# Patient Record
Sex: Female | Born: 1955 | Race: Black or African American | Hispanic: No | Smoking: Current every day smoker
Health system: Southern US, Community
[De-identification: ages and names within clinical notes are randomized; demographics above are authoritative.]

## PROBLEM LIST (undated history)

## (undated) DIAGNOSIS — T7840XA Allergy, unspecified, initial encounter: Secondary | ICD-10-CM

## (undated) DIAGNOSIS — J449 Chronic obstructive pulmonary disease, unspecified: Secondary | ICD-10-CM

## (undated) DIAGNOSIS — R55 Syncope and collapse: Secondary | ICD-10-CM

## (undated) DIAGNOSIS — Z720 Tobacco use: Secondary | ICD-10-CM

## (undated) HISTORY — PX: CHOLECYSTECTOMY: SHX55

## (undated) HISTORY — DX: Allergy, unspecified, initial encounter: T78.40XA

## (undated) HISTORY — DX: Syncope and collapse: R55

## (undated) HISTORY — DX: Chronic obstructive pulmonary disease, unspecified: J44.9

## (undated) HISTORY — DX: Tobacco use: Z72.0

---

## 2001-03-07 ENCOUNTER — Encounter: Payer: Self-pay | Admitting: Internal Medicine

## 2001-03-07 ENCOUNTER — Ambulatory Visit (HOSPITAL_COMMUNITY): Admission: RE | Admit: 2001-03-07 | Discharge: 2001-03-07 | Payer: Self-pay | Admitting: Internal Medicine

## 2001-12-11 ENCOUNTER — Encounter: Payer: Self-pay | Admitting: Obstetrics and Gynecology

## 2001-12-11 ENCOUNTER — Inpatient Hospital Stay (HOSPITAL_COMMUNITY): Admission: AD | Admit: 2001-12-11 | Discharge: 2001-12-11 | Payer: Self-pay | Admitting: Family Medicine

## 2004-07-19 ENCOUNTER — Emergency Department (HOSPITAL_COMMUNITY): Admission: EM | Admit: 2004-07-19 | Discharge: 2004-07-20 | Payer: Self-pay | Admitting: *Deleted

## 2012-09-19 ENCOUNTER — Encounter (HOSPITAL_COMMUNITY): Payer: Self-pay | Admitting: *Deleted

## 2012-09-19 ENCOUNTER — Emergency Department (HOSPITAL_COMMUNITY)
Admission: EM | Admit: 2012-09-19 | Discharge: 2012-09-20 | Disposition: A | Discharge status: Home / Self Care | Payer: Self-pay | Attending: Emergency Medicine | Admitting: Emergency Medicine

## 2012-09-19 ENCOUNTER — Emergency Department (HOSPITAL_COMMUNITY): Payer: Self-pay

## 2012-09-19 DIAGNOSIS — R52 Pain, unspecified: Secondary | ICD-10-CM | POA: Insufficient documentation

## 2012-09-19 DIAGNOSIS — R112 Nausea with vomiting, unspecified: Secondary | ICD-10-CM | POA: Insufficient documentation

## 2012-09-19 DIAGNOSIS — J111 Influenza due to unidentified influenza virus with other respiratory manifestations: Secondary | ICD-10-CM

## 2012-09-19 DIAGNOSIS — R51 Headache: Secondary | ICD-10-CM | POA: Insufficient documentation

## 2012-09-19 DIAGNOSIS — R05 Cough: Secondary | ICD-10-CM | POA: Insufficient documentation

## 2012-09-19 DIAGNOSIS — I951 Orthostatic hypotension: Secondary | ICD-10-CM | POA: Insufficient documentation

## 2012-09-19 DIAGNOSIS — R059 Cough, unspecified: Secondary | ICD-10-CM | POA: Insufficient documentation

## 2012-09-19 DIAGNOSIS — F17200 Nicotine dependence, unspecified, uncomplicated: Secondary | ICD-10-CM | POA: Insufficient documentation

## 2012-09-19 LAB — URINALYSIS, ROUTINE W REFLEX MICROSCOPIC
Glucose, UA: NEGATIVE mg/dL
Leukocytes, UA: NEGATIVE
Protein, ur: NEGATIVE mg/dL
pH: 6 (ref 5.0–8.0)

## 2012-09-19 LAB — URINE MICROSCOPIC-ADD ON

## 2012-09-19 LAB — POCT I-STAT, CHEM 8
Calcium, Ion: 1.13 mmol/L (ref 1.12–1.23)
Chloride: 105 mEq/L (ref 96–112)
Glucose, Bld: 88 mg/dL (ref 70–99)
HCT: 42 % (ref 36.0–46.0)
Hemoglobin: 14.3 g/dL (ref 12.0–15.0)
Potassium: 3.8 mEq/L (ref 3.5–5.1)

## 2012-09-19 MED ORDER — MORPHINE SULFATE 4 MG/ML IJ SOLN
2.0000 mg | Freq: Once | INTRAMUSCULAR | Status: AC
  Administered 2012-09-19: 2 mg via INTRAVENOUS
  Filled 2012-09-19: qty 1

## 2012-09-19 MED ORDER — SODIUM CHLORIDE 0.9 % IV BOLUS (SEPSIS)
1000.0000 mL | Freq: Once | INTRAVENOUS | Status: AC
  Administered 2012-09-19: 1000 mL via INTRAVENOUS

## 2012-09-19 MED ORDER — ONDANSETRON HCL 4 MG/2ML IJ SOLN
4.0000 mg | Freq: Once | INTRAMUSCULAR | Status: AC
  Administered 2012-09-19: 4 mg via INTRAVENOUS
  Filled 2012-09-19: qty 2

## 2012-09-19 MED ORDER — KETOROLAC TROMETHAMINE 30 MG/ML IJ SOLN
30.0000 mg | Freq: Once | INTRAMUSCULAR | Status: AC
  Administered 2012-09-19: 30 mg via INTRAVENOUS
  Filled 2012-09-19: qty 1

## 2012-09-19 NOTE — ED Notes (Signed)
Fever, chills, nausea, vomiting, generalized body aches since Sunday.  Last time vomited was Monday.

## 2012-09-19 NOTE — ED Notes (Signed)
Patient ambulatory to restoom 

## 2012-09-20 MED ORDER — SODIUM CHLORIDE 0.9 % IV SOLN
Freq: Once | INTRAVENOUS | Status: AC
  Administered 2012-09-20: 1000 mL via INTRAVENOUS

## 2012-09-20 MED ORDER — ONDANSETRON HCL 4 MG PO TABS: 4.0000 mg | ORAL_TABLET | Freq: Four times a day (QID) | ORAL | Status: DC

## 2012-09-20 NOTE — ED Provider Notes (Addendum)
History     CSN: [id number]  Arrival date & time 09/19/12  2210   First MD Initiated Contact with Patient 09/19/12 2307      Chief Complaint  Patient presents with  . Influenza    (Consider location/radiation/quality/duration/timing/severity/associated sxs/prior treatment) HPI  Stephanie Hendrix is a 56 y.o. female who presents to the Emergency Department complaining of fever, chills, nausea, vomiting, headache, cough and generalized body aches since Sunday. Last vomiting was Monday. She has been using tylenol for fevers.   History reviewed. No pertinent past medical history.  Past Surgical History  Procedure Date  . Cholecystectomy     No family history on file.  History  Substance Use Topics  . Smoking status: Current Every Day Smoker -- 1.5 packs/day for 43 years  . Smokeless tobacco: Never Used  . Alcohol Use: No    OB History    Grav Para Term Preterm Abortions TAB SAB Ect Mult Living   7 6 6  1  1   6      Review of Systems  Constitutional: Positive for fever, chills and diaphoresis.       10  Systems reviewed and are negative for acute change except as noted in the HPI.  HENT: Negative for congestion.   Eyes: Negative for discharge and redness.  Respiratory: Positive for cough. Negative for shortness of breath.   Cardiovascular: Negative for chest pain.  Gastrointestinal: Positive for nausea and vomiting. Negative for abdominal pain.  Musculoskeletal: Negative for back pain.  Skin: Negative for rash.  Neurological: Positive for headaches. Negative for syncope and numbness.  Psychiatric/Behavioral:       No behavior change.    Allergies  Penicillins  Home Medications   Current Outpatient Rx  Name  Route  Sig  Dispense  Refill  . GUAIFENESIN 100 MG/5ML PO SYRP   Oral   Take 200 mg by mouth daily as needed. For cough           BP 83/71  Pulse 68  Temp 99.3 F (37.4 C) (Oral)  Resp 18  Ht 5\' 6"  (1.676 m)  Wt 140 lb (63.504 kg)  BMI 22.60  kg/m2  SpO2 93%  Physical Exam  Nursing note and vitals reviewed. Constitutional: She appears well-developed and well-nourished.       Awake, alert, nontoxic appearance.  HENT:  Head: Atraumatic.  Right Ear: External ear normal.  Left Ear: External ear normal.  Mouth/Throat: Oropharynx is clear and moist.  Eyes: Right eye exhibits no discharge. Left eye exhibits no discharge.  Neck: Neck supple.  Cardiovascular: Normal heart sounds.   Pulmonary/Chest: Effort normal and breath sounds normal. She exhibits no tenderness.  Abdominal: Soft. There is no tenderness. There is no rebound.  Musculoskeletal: She exhibits no tenderness.       Baseline ROM, no obvious new focal weakness.  Neurological:       Mental status and motor strength appears baseline for patient and situation.  Skin: No rash noted.  Psychiatric: She has a normal mood and affect.    ED Course  Procedures (including critical care time) Results for orders placed during the hospital encounter of 09/19/12  URINALYSIS, ROUTINE W REFLEX MICROSCOPIC      Component Value Range   Color, Urine YELLOW  YELLOW   APPearance CLEAR  CLEAR   Specific Gravity, Urine >1.030 (*) 1.005 - 1.030   pH 6.0  5.0 - 8.0   Glucose, UA NEGATIVE  NEGATIVE mg/dL   Hgb  urine dipstick MODERATE (*) NEGATIVE   Bilirubin Urine SMALL (*) NEGATIVE   Ketones, ur TRACE (*) NEGATIVE mg/dL   Protein, ur NEGATIVE  NEGATIVE mg/dL   Urobilinogen, UA 2.0 (*) 0.0 - 1.0 mg/dL   Nitrite NEGATIVE  NEGATIVE   Leukocytes, UA NEGATIVE  NEGATIVE  URINE MICROSCOPIC-ADD ON      Component Value Range   Squamous Epithelial / LPF MANY (*) RARE   WBC, UA 0-2  <3 WBC/hpf   RBC / HPF 3-6  <3 RBC/hpf   Bacteria, UA MANY (*) RARE   Urine-Other MUCOUS PRESENT    POCT I-STAT, CHEM 8      Component Value Range   Sodium 137  135 - 145 mEq/L   Potassium 3.8  3.5 - 5.1 mEq/L   Chloride 105  96 - 112 mEq/L   BUN 10  6 - 23 mg/dL   Creatinine, Ser 7.84  0.50 - 1.10 mg/dL     Glucose, Bld 88  70 - 99 mg/dL   Calcium, Ion 6.96  2.95 - 1.23 mmol/L   TCO2 21  0 - 100 mmol/L   Hemoglobin 14.3  12.0 - 15.0 g/dL   HCT 28.4  13.2 - 44.0 %    Dg Chest 2 View  09/20/2012  *RADIOLOGY REPORT*  Clinical Data: Body aches and chills; fever, cough, congestion and diaphoresis.  History of smoking.  CHEST - 2 VIEW  Comparison: Chest radiograph performed 07/20/2004  Findings: The lungs are well-aerated.  Mild chronic peribronchial thickening is noted.  There is no evidence of focal opacification, pleural effusion or pneumothorax.  A left-sided nipple shadow is seen.  The heart is normal in size; the mediastinal contour is within normal limits.  No acute osseous abnormalities are seen.  IMPRESSION: Mild chronic peribronchial thickening noted; no acute cardiopulmonary process seen.   Original Report Authenticated By: Tonia Ghent, M.D.      [phone] Ready to discharge patient and noted BP low. Asked that orthostatics be obtained. Patient is orthostatic. Standing BP drops to the 60's. No associated dizziness. Ordered additional liter of IVF.  2536 Patient has had 2 liters of IVF, had more PO fluids. BP back at baseline. Patient continues to feel much better.    MDM  Patient with flu symptoms since Sunday. Given IVF, analgesic, antiinflammatory and antiemetic with improvement. She has taken PO fluids. Feels better.  Pt feels improved after observation and/or treatment in ED.Pt stable in ED with no significant deterioration in condition.The patient appears reasonably screened and/or stabilized for discharge and I doubt any other medical condition or other [facility] requiring further screening, evaluation, or treatment in the ED at this time prior to discharge.  MDM Reviewed: nursing note and vitals Interpretation: labs          Nicoletta Dress. Colon Branch, MD 09/20/12 0127  Nicoletta Dress. Colon Branch, MD 09/20/12 [phone]

## 2012-09-20 NOTE — ED Notes (Signed)
Rechecked Vital signs prior to removing IV.  BP low - requested orthostatic vitals.  Reported results to MD.  Orders received.

## 2012-09-20 NOTE — ED Notes (Signed)
Tolerated ice chips.  Dr Colon Branch back in to talk with patient.

## 2012-09-20 NOTE — ED Notes (Signed)
Home with daughter driving- states she feels better. Denies any dizziness or nausea or pain

## 2012-09-20 NOTE — ED Notes (Signed)
Patient and daughter advised of need for additional fluids prior to discharge.  Daughter sitting at bedside

## 2012-09-20 NOTE — ED Notes (Signed)
Requesting some ice chips.  States her pain is all gone.  Still feels a little "queasy"   Is not vomiting.  States she feels hot now and has removed her covers.

## 2012-09-20 NOTE — ED Notes (Signed)
No complaints from patient during orthostatic vital signs

## 2013-03-28 ENCOUNTER — Emergency Department (HOSPITAL_COMMUNITY)
Admission: EM | Admit: 2013-03-28 | Discharge: 2013-03-28 | Disposition: A | Discharge status: Home / Self Care | Payer: Self-pay | Attending: Emergency Medicine | Admitting: Emergency Medicine

## 2013-03-28 ENCOUNTER — Encounter (HOSPITAL_COMMUNITY): Payer: Self-pay

## 2013-03-28 DIAGNOSIS — Z880 Allergy status to penicillin: Secondary | ICD-10-CM | POA: Insufficient documentation

## 2013-03-28 DIAGNOSIS — L299 Pruritus, unspecified: Secondary | ICD-10-CM | POA: Insufficient documentation

## 2013-03-28 DIAGNOSIS — L509 Urticaria, unspecified: Secondary | ICD-10-CM | POA: Insufficient documentation

## 2013-03-28 DIAGNOSIS — R21 Rash and other nonspecific skin eruption: Secondary | ICD-10-CM

## 2013-03-28 DIAGNOSIS — F17200 Nicotine dependence, unspecified, uncomplicated: Secondary | ICD-10-CM | POA: Insufficient documentation

## 2013-03-28 MED ORDER — FAMOTIDINE 20 MG PO TABS
20.0000 mg | ORAL_TABLET | Freq: Once | ORAL | Status: AC
  Administered 2013-03-28: 20 mg via ORAL
  Filled 2013-03-28: qty 1

## 2013-03-28 MED ORDER — PREDNISONE 10 MG PO TABS: ORAL_TABLET | ORAL | Status: DC

## 2013-03-28 MED ORDER — HYDROXYZINE HCL 25 MG PO TABS: 25.0000 mg | ORAL_TABLET | Freq: Four times a day (QID) | ORAL | Status: DC

## 2013-03-28 MED ORDER — METHYLPREDNISOLONE SODIUM SUCC 125 MG IJ SOLR
125.0000 mg | Freq: Once | INTRAMUSCULAR | Status: AC
  Administered 2013-03-28: 125 mg via INTRAMUSCULAR
  Filled 2013-03-28: qty 2

## 2013-03-28 MED ORDER — HYDROXYZINE HCL 25 MG PO TABS
50.0000 mg | ORAL_TABLET | Freq: Once | ORAL | Status: AC
  Administered 2013-03-28: 50 mg via ORAL
  Filled 2013-03-28: qty 2

## 2013-03-28 NOTE — ED Notes (Signed)
Rash to back of neck,arms and behind ears. First noted this am, has taken benadryl w. No relief.  No resp distress.

## 2013-03-28 NOTE — ED Provider Notes (Signed)
Medical screening examination/treatment/procedure(s) were performed by non-physician practitioner and as supervising physician I was immediately available for consultation/collaboration.  Kenita Bines R. Avacyn Kloosterman, MD 03/28/13 2023 

## 2013-03-28 NOTE — ED Provider Notes (Signed)
History    CSN: [id number] Arrival date & time 03/28/13  1635  None    Chief Complaint  Patient presents with  . Rash   (Consider location/radiation/quality/duration/timing/severity/associated sxs/prior Treatment) HPI Comments: Pt noted rash and itching behind the ears, neck and both arms. No involvement of the abd, or lower extremities. No recent changes in soap, body splash, cologne. Pt denies use of jewry. No SOB.  Patient is a 57 y.o. female presenting with rash. The history is provided by the patient.  Rash Pain location: behind the ears, neck and arms. Pain quality comment:  No pain Pain radiates to:  Does not radiate Pain severity:  No pain Onset quality:  Gradual Duration:  1 day Timing:  Constant Progression:  Worsening Chronicity:  New Context: not alcohol use, not diet changes, not eating, not recent illness and not recent travel   Relieved by:  Nothing Worsened by:  Nothing tried Ineffective treatments:  OTC medications Associated symptoms: no anorexia, no chest pain, no cough, no dysuria, no fever, no hematuria and no shortness of breath   Risk factors: no alcohol abuse and no recent hospitalization    History reviewed. No pertinent past medical history. Past Surgical History  Procedure Laterality Date  . Cholecystectomy     No family history on file. History  Substance Use Topics  . Smoking status: Current Every Day Smoker -- 1.50 packs/day for 43 years    Types: Cigarettes  . Smokeless tobacco: Never Used  . Alcohol Use: No   OB History   Grav Para Term Preterm Abortions TAB SAB Ect Mult Living   7 6 6  1  1   6      Review of Systems  Constitutional: Negative for fever and activity change.       All ROS Neg except as noted in HPI  HENT: Negative for nosebleeds and neck pain.   Eyes: Negative for photophobia and discharge.  Respiratory: Negative for cough, shortness of breath and wheezing.   Cardiovascular: Negative for chest pain and  palpitations.  Gastrointestinal: Negative for abdominal pain, blood in stool and anorexia.  Genitourinary: Negative for dysuria, frequency and hematuria.  Musculoskeletal: Negative for back pain and arthralgias.  Skin: Positive for rash.  Neurological: Negative for dizziness, seizures and speech difficulty.  Psychiatric/Behavioral: Negative for hallucinations and confusion.    Allergies  Penicillins  Home Medications   Current Outpatient Rx  Name  Route  Sig  Dispense  Refill  . guaifenesin (TUSSIN) 100 MG/5ML syrup   Oral   Take 200 mg by mouth daily as needed. For cough         . ondansetron (ZOFRAN) 4 MG tablet   Oral   Take 1 tablet (4 mg total) by mouth every 6 (six) hours.   12 tablet   0    BP 112/56  Pulse 65  Temp(Src) 97.1 F (36.2 C) (Oral)  Resp 18  Ht 5' 5.5" (1.664 m)  Wt 142 lb (64.411 kg)  BMI 23.26 kg/m2  SpO2 100% Physical Exam  Nursing note and vitals reviewed. Constitutional: She is oriented to person, place, and time. She appears well-developed and well-nourished.  Non-toxic appearance.  HENT:  Head: Normocephalic.  Right Ear: Tympanic membrane and external ear normal.  Left Ear: Tympanic membrane and external ear normal.  Eyes: EOM and lids are normal. Pupils are equal, round, and reactive to light.  Neck: Normal range of motion. Neck supple. Carotid bruit is not present.  Cardiovascular:  Normal rate, regular rhythm, normal heart sounds, intact distal pulses and normal pulses.   Pulmonary/Chest: Breath sounds normal. No respiratory distress.  Abdominal: Soft. Bowel sounds are normal. There is no tenderness. There is no guarding.  Musculoskeletal: Normal range of motion.  Lymphadenopathy:       Head (right side): No submandibular adenopathy present.       Head (left side): No submandibular adenopathy present.    She has no cervical adenopathy.  Neurological: She is alert and oriented to person, place, and time. She has normal strength. No  cranial nerve deficit or sensory deficit.  Skin: Skin is warm and dry.  Hives type rash behind ears, back of the neck, and on the inner aspect of the forearms.  Patient has insect bites on the arms that have been scratched and some with denuded areas present.  Psychiatric: She has a normal mood and affect. Her speech is normal.    ED Course  Procedures (including critical care time) Labs Reviewed - No data to display No results found. No diagnosis found.  MDM  I have reviewed nursing notes, vital signs, and all appropriate lab and imaging results for this patient. Patient presents to the emergency department with a hives type rash behind the ears, neck, and both arms. No definite trigger identified. Airway is patent, speech is clear and understandable. Patient speaks in complete sentences.  Patient will be treated with prednisone taper, and Vistaril. Patient to return if any changes or complications.  Kathie Dike, PA-C 03/28/13 [phone]

## 2013-03-28 NOTE — ED Notes (Signed)
Itching rash behind ears and on neck, onset this am. Denies any new soaps, lotions or shampool

## 2013-08-12 ENCOUNTER — Other Ambulatory Visit (HOSPITAL_COMMUNITY): Payer: Self-pay | Admitting: Physician Assistant

## 2013-08-12 DIAGNOSIS — Z139 Encounter for screening, unspecified: Secondary | ICD-10-CM

## 2013-08-26 ENCOUNTER — Ambulatory Visit (HOSPITAL_COMMUNITY): Payer: Self-pay

## 2014-07-27 ENCOUNTER — Encounter (HOSPITAL_COMMUNITY): Payer: Self-pay

## 2016-05-22 ENCOUNTER — Emergency Department (HOSPITAL_COMMUNITY)
Admission: EM | Admit: 2016-05-22 | Discharge: 2016-05-22 | Disposition: A | Discharge status: Home / Self Care | Payer: Self-pay | Attending: Emergency Medicine | Admitting: Emergency Medicine

## 2016-05-22 ENCOUNTER — Encounter (HOSPITAL_COMMUNITY): Payer: Self-pay | Admitting: *Deleted

## 2016-05-22 DIAGNOSIS — R21 Rash and other nonspecific skin eruption: Secondary | ICD-10-CM | POA: Insufficient documentation

## 2016-05-22 DIAGNOSIS — F17210 Nicotine dependence, cigarettes, uncomplicated: Secondary | ICD-10-CM | POA: Insufficient documentation

## 2016-05-22 MED ORDER — PREDNISONE 20 MG PO TABS: ORAL_TABLET | ORAL | 0 refills | Status: DC

## 2016-05-22 MED ORDER — PREDNISONE 50 MG PO TABS
60.0000 mg | ORAL_TABLET | Freq: Once | ORAL | Status: AC
  Administered 2016-05-22: 60 mg via ORAL
  Filled 2016-05-22: qty 1

## 2016-05-22 MED ORDER — DIPHENHYDRAMINE HCL 25 MG PO CAPS
25.0000 mg | ORAL_CAPSULE | Freq: Once | ORAL | Status: AC
  Administered 2016-05-22: 25 mg via ORAL
  Filled 2016-05-22: qty 1

## 2016-05-22 MED ORDER — FAMOTIDINE 20 MG PO TABS: 20.0000 mg | ORAL_TABLET | Freq: Two times a day (BID) | ORAL | 0 refills | Status: DC

## 2016-05-22 MED ORDER — FAMOTIDINE 20 MG PO TABS
20.0000 mg | ORAL_TABLET | Freq: Once | ORAL | Status: AC
  Administered 2016-05-22: 20 mg via ORAL
  Filled 2016-05-22: qty 1

## 2016-05-22 NOTE — Discharge Instructions (Signed)
Call Dr. Scharlene GlossHall's office at [phone], his offices at [address] [place], to get an appointment for further evaluation of this rash. Take the medication as prescribed. Recheck if you get a fever, the rash starts draining pus or you get a red streak running up your arm, increased redness or swelling.

## 2016-05-22 NOTE — ED Notes (Signed)
Patient verbalizes understanding of discharge instructions, prescriptions, home care and follow up care if needed. Patient out of department at this time. 

## 2016-05-22 NOTE — ED Triage Notes (Signed)
Pt c/o rash, itching to left upper am for "awhile" that is always worse at night,

## 2016-05-22 NOTE — ED Provider Notes (Signed)
[facility] Provider Note   CSN: [id number] Arrival date & time: 05/22/16  0107  Time seen 01:20 AM   History   Chief Complaint Chief Complaint  Patient presents with  . Rash    HPI Stephanie Hendrix is a 60 y.o. female.  HPI patient reports she has had a rash on her left upper arm "for a while" when asked how long that is she states at least 3 months. She states it will have small raised areas of her skin and some of them get bigger but some stasis small. She states they don't drain. She states today it started itching. She states it normally doesn't itch all the time. She denies any fever, change in her laundry or detergents. She states the rash is only on her left upper arm no rales. She states she is right-handed. She has never seen her PCP about this rash. However she was seen on July 4 in the ED for a rash.   PCP Dr Felecia Shelling  History reviewed. No pertinent past medical history.  There are no active problems to display for this patient.   Past Surgical History:  Procedure Laterality Date  . CHOLECYSTECTOMY      OB History    Gravida Para Term Preterm AB Living   7 6 6   1 6    SAB TAB Ectopic Multiple Live Births   1               Home Medications   None   Family History No family history on file.  Social History Social History  Substance Use Topics  . Smoking status: Current Every Day Smoker    Packs/day: 1.50    Years: 43.00    Types: Cigarettes  . Smokeless tobacco: Never Used  . Alcohol use No  Retired Financial risk analyst   Allergies   Neomycin   Review of Systems Review of Systems  All other systems reviewed and are negative.    Physical Exam Updated Vital Signs BP 124/87 (BP Location: Left Arm)   Pulse 98   Resp 14   Ht 5' 6.5" (1.689 m)   Wt 140 lb (63.5 kg)   SpO2 98%   BMI 22.26 kg/m   Vital signs normal    Physical Exam  Constitutional: She is oriented to person, place, and time. She appears well-developed and well-nourished.   Non-toxic appearance. She does not appear ill. No distress.  HENT:  Head: Normocephalic and atraumatic.  Right Ear: External ear normal.  Left Ear: External ear normal.  Nose: Nose normal. No mucosal edema or rhinorrhea.  Mouth/Throat: Mucous membranes are normal. No dental abscesses or uvula swelling.  Eyes: Conjunctivae and EOM are normal.  Neck: Normal range of motion and full passive range of motion without pain.  Cardiovascular: Normal rate.   Pulmonary/Chest: Effort normal. No respiratory distress. She has no rhonchi. She exhibits no crepitus.  Abdominal: Normal appearance.  Musculoskeletal: Normal range of motion. She exhibits no edema or tenderness.  Moves all extremities well.   Neurological: She is alert and oriented to person, place, and time. She has normal strength. No cranial nerve deficit.  Skin: Skin is warm, dry and intact. Rash noted. No erythema. No pallor.  Patient is noted to have a rash on her left upper arm is skin colored or slightly darker than normal skin color and is raised. There are no vesicles or blistering seen. There are excoriations seen. There is no evidence of a secondary infection seen.  Psychiatric: She has a normal mood and affect. Her speech is normal and behavior is normal. Her mood appears not anxious.  Nursing note and vitals reviewed.        ED Treatments / Results   Procedures Procedures (including critical care time)  Medications Ordered in ED Medications  predniSONE (DELTASONE) tablet 60 mg (60 mg Oral Given 05/22/16 0140)  diphenhydrAMINE (BENADRYL) capsule 25 mg (25 mg Oral Given 05/22/16 0140)  famotidine (PEPCID) tablet 20 mg (20 mg Oral Given 05/22/16 0140)     Initial Impression / Assessment and Plan / ED Course  I have reviewed the triage vital signs and the nursing notes.  Pertinent labs & imaging results that were available during my care of the patient were reviewed by me and considered in my medical decision making (see  chart for details).  Clinical Course    Patient was given prednisone, Benadryl, and Pepcid orally. We discussed seeing a dermatologist to try to figure out what this rash is, I'm not sure what type of rash this is at this point. The fact that it has been there for 3 months does not appear to be an allergic type rash however she is having itching so she was treated with allergy type medications.  Final Clinical Impressions(s) / ED Diagnoses   Final diagnoses:  Rash    New Prescriptions Discharge Medication List as of 05/22/2016  2:39 AM    START taking these medications   Details  famotidine (PEPCID) 20 MG tablet Take 1 tablet (20 mg total) by mouth 2 (two) times daily., Starting Mon 05/22/2016, Print       Plan discharge  Devoria AlbeIva Yuette Putnam, MD, Concha PyoFACEP    Libni Fusaro, MD 05/22/16 [phone]

## 2016-12-20 ENCOUNTER — Emergency Department (HOSPITAL_COMMUNITY): Payer: Self-pay

## 2016-12-20 ENCOUNTER — Encounter (HOSPITAL_COMMUNITY): Payer: Self-pay | Admitting: *Deleted

## 2016-12-20 ENCOUNTER — Emergency Department (HOSPITAL_COMMUNITY)
Admission: EM | Admit: 2016-12-20 | Discharge: 2016-12-20 | Disposition: A | Discharge status: Home / Self Care | Payer: Self-pay | Attending: Emergency Medicine | Admitting: Emergency Medicine

## 2016-12-20 DIAGNOSIS — F17210 Nicotine dependence, cigarettes, uncomplicated: Secondary | ICD-10-CM | POA: Insufficient documentation

## 2016-12-20 DIAGNOSIS — Y92410 Unspecified street and highway as the place of occurrence of the external cause: Secondary | ICD-10-CM | POA: Insufficient documentation

## 2016-12-20 DIAGNOSIS — Y999 Unspecified external cause status: Secondary | ICD-10-CM | POA: Insufficient documentation

## 2016-12-20 DIAGNOSIS — S20211A Contusion of right front wall of thorax, initial encounter: Secondary | ICD-10-CM | POA: Insufficient documentation

## 2016-12-20 DIAGNOSIS — Y9389 Activity, other specified: Secondary | ICD-10-CM | POA: Insufficient documentation

## 2016-12-20 MED ORDER — DICLOFENAC SODIUM 75 MG PO TBEC: 75.0000 mg | DELAYED_RELEASE_TABLET | Freq: Two times a day (BID) | ORAL | 0 refills | Status: DC
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
## 2016-12-20 NOTE — Discharge Instructions (Signed)
Your xrays are negative today for a rib fracture. Use a heating pad 20 minutes several times daily to your chest wall.

## 2016-12-22 ENCOUNTER — Encounter: Payer: Self-pay | Admitting: Cardiovascular Disease

## 2016-12-22 ENCOUNTER — Ambulatory Visit (INDEPENDENT_AMBULATORY_CARE_PROVIDER_SITE_OTHER): Payer: Self-pay | Admitting: Cardiovascular Disease

## 2016-12-22 VITALS — BP 117/76 | HR 56 | Ht 66.0 in | Wt 127.8 lb

## 2016-12-22 DIAGNOSIS — Z720 Tobacco use: Secondary | ICD-10-CM

## 2016-12-22 DIAGNOSIS — R55 Syncope and collapse: Secondary | ICD-10-CM

## 2016-12-22 HISTORY — DX: Tobacco use: Z72.0

## 2016-12-22 MED ORDER — VARENICLINE TARTRATE 0.5 MG X 11 & 1 MG X 42 PO MISC: ORAL | 0 refills | Status: DC

## 2016-12-22 MED ORDER — VARENICLINE TARTRATE 1 MG PO TABS: 1.0000 mg | ORAL_TABLET | Freq: Two times a day (BID) | ORAL | 2 refills | Status: DC

## 2016-12-22 NOTE — Patient Instructions (Addendum)
Medication Instructions:  START CHANTIX Take one 0.5 mg tablet by mouth once daily for 3 days, then increase to one 0.5 mg tablet twice daily for 4 days, then increase to one 1 mg tablet twice daily.  Labwork: none   Testing/Procedures: none  Follow-Up: Your physician recommends that you schedule a follow-up appointment in: 2 month ov  You have been referred to ELECTROPHYSIOLOGIST (EP) AT [address].   If you need a refill on your cardiac medications before your next appointment, please call your pharmacy.
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
## 2017-01-09 ENCOUNTER — Institutional Professional Consult (permissible substitution): Payer: Self-pay | Admitting: Internal Medicine

## 2017-01-11 ENCOUNTER — Telehealth: Payer: Self-pay | Admitting: Cardiovascular Disease

## 2017-01-11 NOTE — Telephone Encounter (Signed)
Received records from [facility] appointment on 02/21/17 with Dr Duke Salvia.  Records put with Dr Leonides Sake schedule for 02/21/17. lp

## 2017-02-07 ENCOUNTER — Telehealth: Payer: Self-pay | Admitting: Internal Medicine

## 2017-02-07 ENCOUNTER — Ambulatory Visit (INDEPENDENT_AMBULATORY_CARE_PROVIDER_SITE_OTHER): Payer: Self-pay | Admitting: Internal Medicine

## 2017-02-07 ENCOUNTER — Encounter: Payer: Self-pay | Admitting: Internal Medicine

## 2017-02-07 ENCOUNTER — Encounter (INDEPENDENT_AMBULATORY_CARE_PROVIDER_SITE_OTHER): Payer: Self-pay

## 2017-02-07 ENCOUNTER — Ambulatory Visit: Payer: Self-pay | Admitting: Internal Medicine

## 2017-02-07 VITALS — BP 100/69 | HR 57 | Ht 66.0 in | Wt 126.0 lb

## 2017-02-07 DIAGNOSIS — R55 Syncope and collapse: Secondary | ICD-10-CM

## 2017-02-07 MED ORDER — DIAZEPAM 5 MG PO TABS: ORAL_TABLET | ORAL | 0 refills | Status: DC

## 2017-02-07 NOTE — Patient Instructions (Addendum)
Medication Instructions: - Your physician recommends that you continue on your current medications as directed. Please refer to the Current Medication list given to you today.  Labwork: - none ordered  Procedures/Testing: - Your physician has requested that you have a cardiac MRI. Cardiac MRI uses a computer to create images of your heart as its beating, producing both still and moving pictures of your heart and major blood vessels. For further information please visit [email or web address].   Dirk Dress** [place] will contact you to schedule the MRI**  Follow-Up: - Your physician recommends that you schedule a follow-up appointment in: 4-6 weeks with Dr. Graciela HusbandsKlein.  ** We will contact you after the MRI is scheduled for follow up with Dr. Graciela HusbandsKlein**   Any Additional Special Instructions Will Be Listed Below (If Applicable).     If you need a refill on your cardiac medications before your next appointment, please call your pharmacy.

## 2017-02-07 NOTE — Telephone Encounter (Signed)
Called patient to find out when she wanted her cardiac MRI.  The patient wishes to have a morning appointment any day of the week.  Message sent to precert and technician.
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
## 2017-02-08 ENCOUNTER — Encounter: Payer: Self-pay | Admitting: Internal Medicine

## 2017-02-08 ENCOUNTER — Telehealth: Payer: Self-pay | Admitting: Cardiovascular Disease

## 2017-02-08 NOTE — Telephone Encounter (Signed)
Called the patient and LVM regarding cardiac MRI scheduled for 02-16-17 at 8 a.m.  I also told her to bring her current insurance card to the appointment.  Letter mailed to patient today and msg to nurse.

## 2017-02-16 ENCOUNTER — Ambulatory Visit (HOSPITAL_COMMUNITY): Admission: RE | Admit: 2017-02-16 | Payer: Self-pay | Source: Ambulatory Visit
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
## 2017-02-21 ENCOUNTER — Ambulatory Visit: Payer: Self-pay | Admitting: Cardiovascular Disease

## 2017-02-28 ENCOUNTER — Ambulatory Visit (HOSPITAL_COMMUNITY)
Admission: RE | Admit: 2017-02-28 | Discharge: 2017-02-28 | Disposition: A | Discharge status: Home / Self Care | Payer: No Typology Code available for payment source | Source: Ambulatory Visit | Attending: Internal Medicine | Admitting: Internal Medicine

## 2017-02-28 DIAGNOSIS — R55 Syncope and collapse: Secondary | ICD-10-CM | POA: Diagnosis not present

## 2017-02-28 DIAGNOSIS — Q211 Atrial septal defect: Secondary | ICD-10-CM | POA: Diagnosis not present

## 2017-02-28 LAB — CREATININE, SERUM
Creatinine, Ser: 0.99 mg/dL (ref 0.44–1.00)
GFR, EST NON AFRICAN AMERICAN: 60 mL/min — AB (ref >60)

## 2017-02-28 MED ORDER — GADOBENATE DIMEGLUMINE 529 MG/ML IV SOLN
25.0000 mL | Freq: Once | INTRAVENOUS | Status: AC
Start: 2017-02-28 — End: 2017-02-28
  Administered 2017-02-28: 25 mL via INTRAVENOUS

## 2017-03-07 ENCOUNTER — Telehealth: Payer: Self-pay | Admitting: Internal Medicine

## 2017-03-07 NOTE — Telephone Encounter (Signed)
Spoke with the patient- she is aware of her cardiac MRI results.

## 2017-03-07 NOTE — Telephone Encounter (Signed)
Follow up    Pt is calling for Mri results.

## 2017-03-07 NOTE — Telephone Encounter (Signed)
Follow Up:    Pt would like her MRI results from 02-28-17 please.

## 2017-03-23 ENCOUNTER — Ambulatory Visit: Payer: Self-pay | Admitting: Internal Medicine

## 2017-05-04 ENCOUNTER — Ambulatory Visit: Payer: Self-pay | Admitting: Internal Medicine

## 2017-05-16 ENCOUNTER — Encounter: Payer: Self-pay | Admitting: Internal Medicine

## 2018-04-26 IMAGING — MR MR CARD MORPHOLOGY WO/W CM
19 of 20 series · 47 of 48 positions shown · IV contrast (25    Multihance)
Comparison: none

CLINICAL DATA: Syncope with Dilated RA

EXAM:
CARDIAC MRI
TECHNIQUE: The patient was scanned on a 1.5 Tesla GE magnet. A dedicated
cardiac coil was used. Functional imaging was done using Fiesta
sequences. [DATE], and 4 chamber views were done to assess for RWMA's.
Modified Adenyo rule using a short axis stack was used to
calculate an ejection fraction on a dedicated work station using
Circle software. The patient received Multihance. After 10 minutes
inversion recovery sequences were used to assess for infiltration
and scar tissue.
CONTRAST:  Multihance

[Series 4: bSSFP · sagittal · 8.0mm · 1.25mm/px · 1 of 14 slices shown (1 of 5)]
[im 1/14]
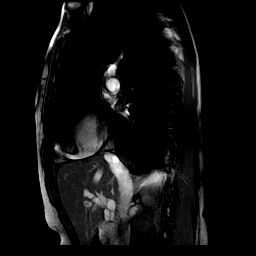

[Series 5: bSSFP · oblique · 8.0mm · 1.25mm/px · 1 of 20 slices shown (2 of 5)]
[im 1/20]
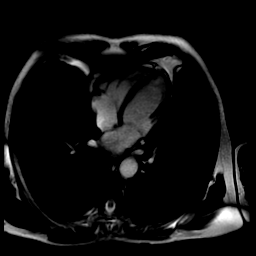

[Series 8: bSSFP · oblique · 8.0mm · 1.29mm/px · 15 of 300 slices shown (3 of 5)]
[im 1/300]
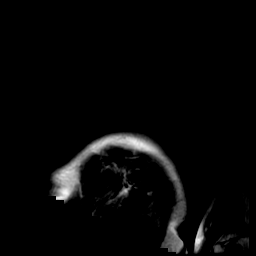
[im 22/300]
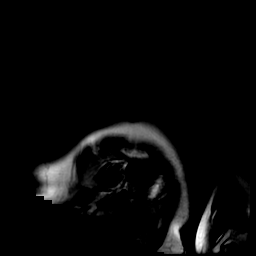
[im 43/300]
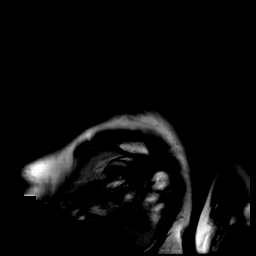
[im 65/300]
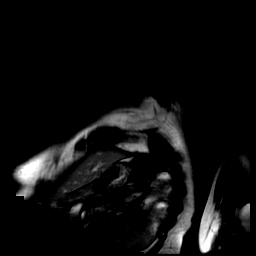
[im 86/300]
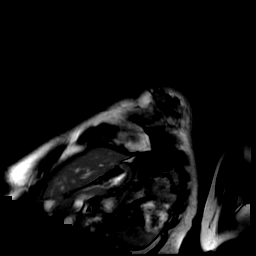
[im 107/300]
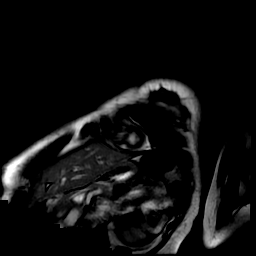
[im 129/300]
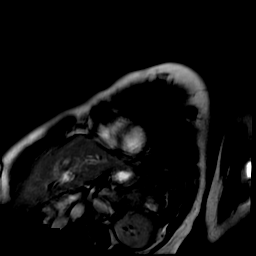
[im 150/300]
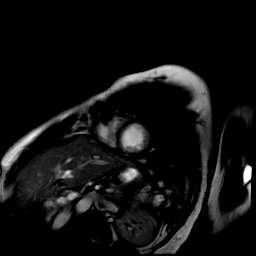
[im 171/300]
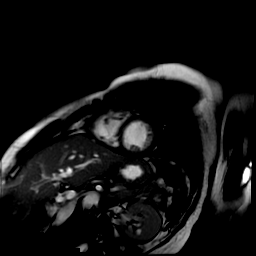
[im 193/300]
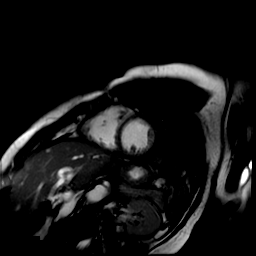
[im 214/300]
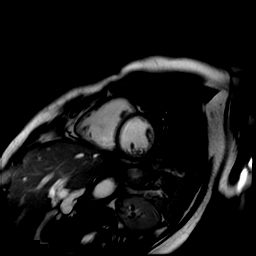
[im 235/300]
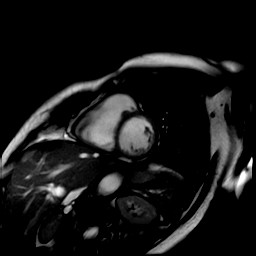
[im 257/300]
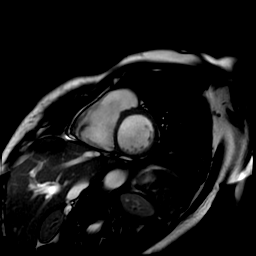
[im 278/300]
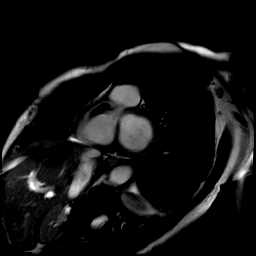
[im 300/300]
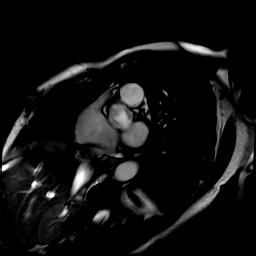

[Series 9: T1 · axial · 6.0mm · 1.09mm/px · 1 of 12 slices shown (1 of 3)]
[im 1/12]
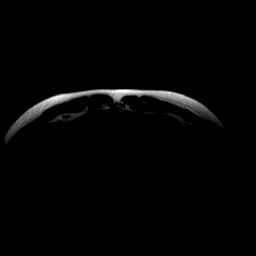

[Series 10: T1 · sagittal · 6.0mm · 1.02mm/px · 1 of 16 slices shown (2 of 3)]
[im 1/16]
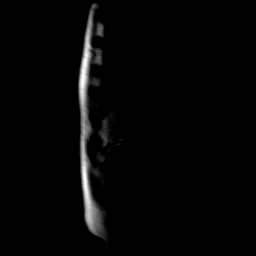

[Series 11: T1 · axial · 6.0mm · 1.09mm/px · 1 of 16 slices shown (3 of 3)]
[im 1/16]
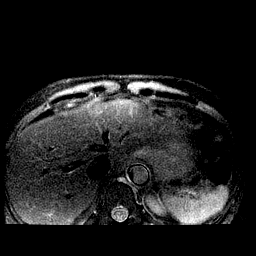

[Series 14: bSSFP · oblique · 5.0mm · 1.25mm/px · 12 of 220 slices shown (4 of 5)]
[im 1/220]
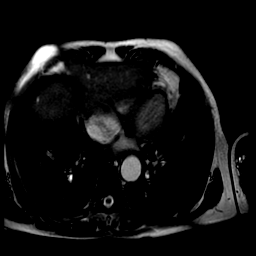
[im 20/220]
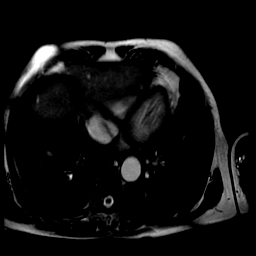
[im 40/220]
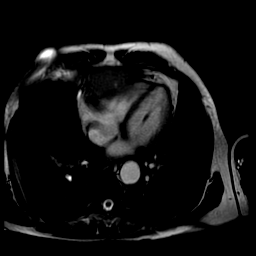
[im 60/220]
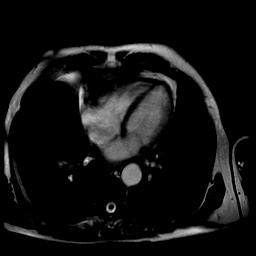
[im 80/220]
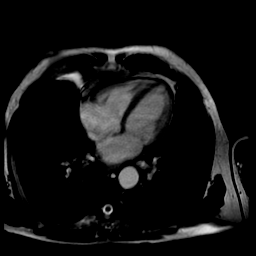
[im 100/220]
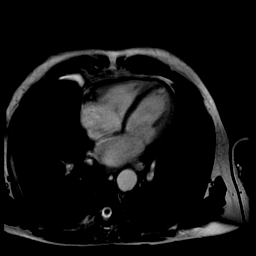
[im 120/220]
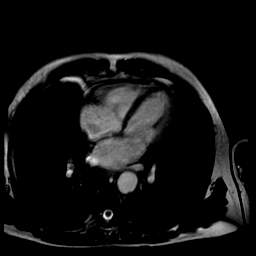
[im 140/220]
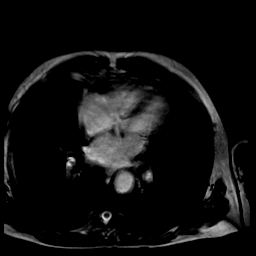
[im 160/220]
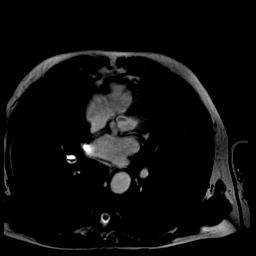
[im 180/220]
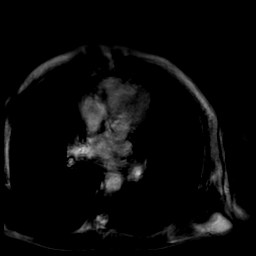
[im 200/220]
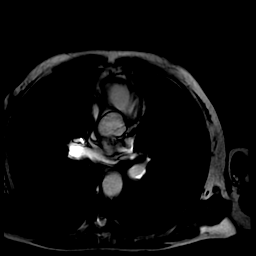
[im 220/220]
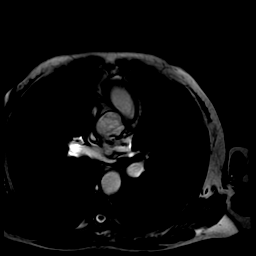

[Series 18: bSSFP · oblique · 8.0mm · 1.29mm/px · 3 of 60 slices shown (5 of 5)]
[im 1/60]
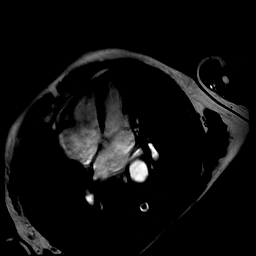
[im 30/60]
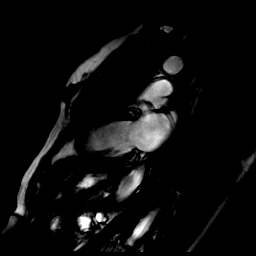
[im 60/60]
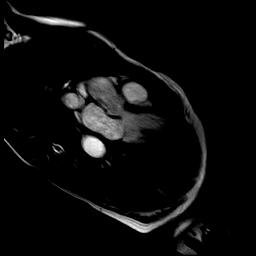

[Series 19: cine ir · oblique · 8.0mm · 1.37mm/px · 2 of 30 slices shown]
[im 1/30]
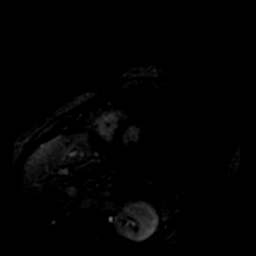
[im 30/30]
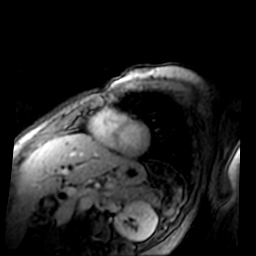

[Series 23: delayed ir prep · oblique · 8.0mm · 1.29mm/px · 1 of 15 slices shown (1 of 2)]
[im 1/15]
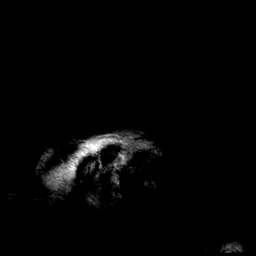

[Series 24: delayed ir prep · oblique · 8.0mm · 1.29mm/px · 1 of 4 slices shown (2 of 2)]
[im 1/4]
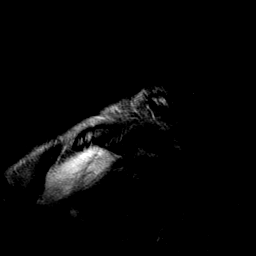

[Series 25: rad delayed ir · oblique · 8.0mm · 1.29mm/px · 1 of 3 slices shown (1 of 2)]
[im 1/3]
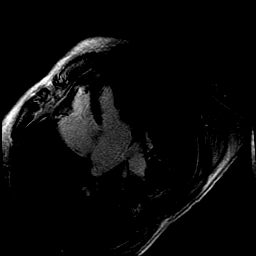

[Series 29: rad delayed ir · axial · 8.0mm · 1.29mm/px · 1 of 1 slices shown (2 of 2)]
[im 1/1]
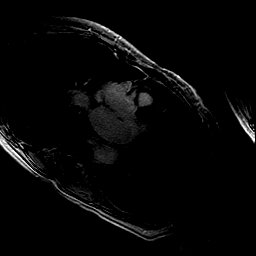

[Series 1750: sa base · oblique · 8.0mm · 2.81mm/px · 1 of 23 slices shown]
[im 1/23]
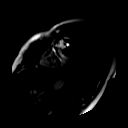

[Series 1751: sa mid · oblique · 8.0mm · 2.81mm/px · 1 of 23 slices shown]
[im 1/23]
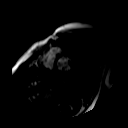

[Series 1752: sa apex · axial · 8.0mm · 2.81mm/px · 1 of 23 slices shown]
[im 1/23]
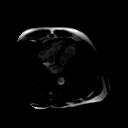

[Series 1753: 2 ch · sagittal · 8.0mm · 2.81mm/px · 1 of 23 slices shown]
[im 1/23]
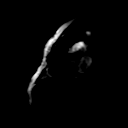

[Series 1754: 3 ch · oblique · 8.0mm · 2.81mm/px · 1 of 23 slices shown]
[im 1/23]
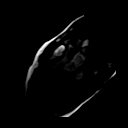

[Series 1755: 4 ch · oblique · 8.0mm · 2.81mm/px · 1 of 23 slices shown]
[im 1/23]
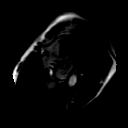

[47 of 48 positions shown; findings below may reference images not displayed]

FINDINGS: There was moderate IRENE and mild LAE. The RV was normal in size and
function There were no diverticula and no evidence of RV dysplasia.
Real time imaging appeared to show two jets of PFO with only left to
right shunting. The septum was mobile with no obvious drop out of
full ASD. The mitral, aortic and tricuspid valves appeared normal
with mild MR and TR. The LV was normal in size and function The
quantitative EF was 67% (EDV 91 cc ESV 30 cc SV 61 cc) The aortic
root was normal There was no pericardial effusion The pulmonary
veins appeared to drain normally into the LA
IMPRESSION: 1) PFO present with only left to right flow seen

2) Moderate RA and mild LA enlargement

3) Normal RV size and function with no evidence of RV dysplasia

4) Normal LV size and function EF 67%

5) No scar/infiltration on delayed inversion recovery sequences
using gadolinium

Nandine Cheret

## 2018-08-24 ENCOUNTER — Emergency Department (HOSPITAL_COMMUNITY): Payer: Self-pay

## 2018-08-24 ENCOUNTER — Other Ambulatory Visit: Payer: Self-pay

## 2018-08-24 ENCOUNTER — Encounter (HOSPITAL_COMMUNITY): Payer: Self-pay | Admitting: Emergency Medicine

## 2018-08-24 ENCOUNTER — Emergency Department (HOSPITAL_COMMUNITY)
Admission: EM | Admit: 2018-08-24 | Discharge: 2018-08-24 | Disposition: A | Discharge status: Home / Self Care | Payer: Self-pay | Attending: Emergency Medicine | Admitting: Emergency Medicine

## 2018-08-24 DIAGNOSIS — R197 Diarrhea, unspecified: Secondary | ICD-10-CM | POA: Insufficient documentation

## 2018-08-24 DIAGNOSIS — R55 Syncope and collapse: Secondary | ICD-10-CM | POA: Insufficient documentation

## 2018-08-24 DIAGNOSIS — Z79899 Other long term (current) drug therapy: Secondary | ICD-10-CM | POA: Insufficient documentation

## 2018-08-24 DIAGNOSIS — F17210 Nicotine dependence, cigarettes, uncomplicated: Secondary | ICD-10-CM | POA: Insufficient documentation

## 2018-08-24 DIAGNOSIS — R1084 Generalized abdominal pain: Secondary | ICD-10-CM | POA: Insufficient documentation

## 2018-08-24 LAB — URINALYSIS, ROUTINE W REFLEX MICROSCOPIC
Bacteria, UA: NONE SEEN
Bilirubin Urine: NEGATIVE
Glucose, UA: NEGATIVE mg/dL
Ketones, ur: NEGATIVE mg/dL
Leukocytes, UA: NEGATIVE
Nitrite: NEGATIVE
Protein, ur: NEGATIVE mg/dL
SPECIFIC GRAVITY, URINE: 1.036 — AB (ref 1.005–1.030)
pH: 5 (ref 5.0–8.0)

## 2018-08-24 LAB — COMPREHENSIVE METABOLIC PANEL
ALT: 36 U/L (ref 0–44)
AST: 62 U/L — ABNORMAL HIGH (ref 15–41)
Albumin: 4 g/dL (ref 3.5–5.0)
Alkaline Phosphatase: 75 U/L (ref 38–126)
Anion gap: 7 (ref 5–15)
BUN: 12 mg/dL (ref 8–23)
CO2: 22 mmol/L (ref 22–32)
Calcium: 8.9 mg/dL (ref 8.9–10.3)
Chloride: 107 mmol/L (ref 98–111)
Creatinine, Ser: 0.82 mg/dL (ref 0.44–1.00)
GFR calc Af Amer: >60 mL/min (ref >60)
GFR calc non Af Amer: >60 mL/min (ref >60)
Glucose, Bld: 107 mg/dL — ABNORMAL HIGH (ref 70–99)
Potassium: 3.9 mmol/L (ref 3.5–5.1)
Sodium: 136 mmol/L (ref 135–145)
TOTAL PROTEIN: 7.5 g/dL (ref 6.5–8.1)
Total Bilirubin: 0.7 mg/dL (ref 0.3–1.2)

## 2018-08-24 LAB — CBC
HCT: 41.6 % (ref 36.0–46.0)
Hemoglobin: 13.1 g/dL (ref 12.0–15.0)
MCH: 23.2 pg — ABNORMAL LOW (ref 26.0–34.0)
MCHC: 31.5 g/dL (ref 30.0–36.0)
MCV: 73.6 fL — ABNORMAL LOW (ref 80.0–100.0)
Platelets: 230 10*3/uL (ref 150–400)
RBC: 5.65 MIL/uL — ABNORMAL HIGH (ref 3.87–5.11)
RDW: 15.7 % — AB (ref 11.5–15.5)
WBC: 8.8 10*3/uL (ref 4.0–10.5)
nRBC: 0.2 % (ref 0.0–0.2)

## 2018-08-24 LAB — TROPONIN I: Troponin I: <0.03 ng/mL (ref <0.03)

## 2018-08-24 LAB — LIPASE, BLOOD: Lipase: 27 U/L (ref 11–51)

## 2018-08-24 MED ORDER — SODIUM CHLORIDE 0.9 % IV BOLUS
1000.0000 mL | Freq: Once | INTRAVENOUS | Status: AC
  Administered 2018-08-24: 1000 mL via INTRAVENOUS

## 2018-08-24 MED ORDER — IOPAMIDOL (ISOVUE-300) INJECTION 61%
100.0000 mL | Freq: Once | INTRAVENOUS | Status: AC | PRN
  Administered 2018-08-24: 100 mL via INTRAVENOUS

## 2018-08-24 NOTE — ED Notes (Signed)
Spec to lab

## 2018-08-24 NOTE — ED Triage Notes (Signed)
Patient states she went to the bathroom a couple of hours ago, started having upper abdominal pain, sweating, and had LOC. Patient states she is still having pain, almost lost consciousness again and is having pain in her L arm. EKG in progress in Triage.

## 2018-08-24 NOTE — ED Provider Notes (Signed)
[facility] EMERGENCY DEPARTMENT Provider Note   CSN: [id number] Arrival date & time: 08/24/18  1518     History   Chief Complaint Chief Complaint  Patient presents with  . Abdominal Pain  . Loss of Consciousness    HPI Stephanie Hendrix is a 62 y.o. female.  HPI Patient presents with upper abdominal pain which started this morning.  She is had multiple bouts of loose stools.  States that she became lightheaded, diaphoretic and had a brief syncopal episode while having a bowel movement.  Denies any trauma.  Had another similar episode but did not lose consciousness while in the waiting room.  Abdominal pain does not radiate.  She has had nausea but no vomiting.  No fever or chills.  Denies any chest pain or shortness of breath.  No known sick contacts. Past Medical History:  Diagnosis Date  . Syncope   . Tobacco abuse 12/22/2016    Patient Active Problem List   Diagnosis Date Noted  . Syncope 12/22/2016  . Tobacco abuse 12/22/2016    Past Surgical History:  Procedure Laterality Date  . CHOLECYSTECTOMY       OB History    Gravida  7   Para  6   Term  6   Preterm      AB  1   Living  6     SAB  1   TAB      Ectopic      Multiple      Live Births               Home Medications    Prior to Admission medications   Medication Sig Start Date End Date Taking? Authorizing Provider  acetaminophen (TYLENOL) 325 MG tablet Take 650 mg by mouth every 6 (six) hours as needed for mild pain, moderate pain or headache.   Yes [provider]  ibuprofen (ADVIL,MOTRIN) 200 MG tablet Take 200 mg by mouth every 6 (six) hours as needed for mild pain.   Yes [provider]  diazepam (VALIUM) 5 MG tablet Take one tablet (5 mg) on arrival for MRI 02/07/17   Duke Salvia, MD    Family History Family History  Problem Relation Age of Onset  . COPD Mother   . Cancer Father   . Breast cancer Maternal Grandmother     Social History Social  History   Tobacco Use  . Smoking status: Current Every Day Smoker    Packs/day: 1.00    Years: 43.00    Pack years: 43.00    Types: Cigarettes  . Smokeless tobacco: Never Used  Substance Use Topics  . Alcohol use: No  . Drug use: No     Allergies   Neomycin   Review of Systems Review of Systems  Constitutional: Positive for diaphoresis. Negative for chills, fatigue and fever.  HENT: Negative for sore throat and trouble swallowing.   Respiratory: Negative for cough and shortness of breath.   Cardiovascular: Negative for chest pain.  Gastrointestinal: Positive for abdominal pain, diarrhea and nausea. Negative for blood in stool, constipation and vomiting.  Genitourinary: Negative for dysuria, flank pain and frequency.  Musculoskeletal: Negative for back pain, myalgias, neck pain and neck stiffness.  Skin: Negative for rash and wound.  Neurological: Positive for syncope and light-headedness. Negative for weakness, numbness and headaches.  All other systems reviewed and are negative.    Physical Exam Updated Vital Signs BP 105/70   Pulse (!) 50  Temp (!) 97.4 F (36.3 C) (Oral)   Resp 20   Ht 5\' 6"  (1.676 m)   Wt 59 kg   SpO2 97%   BMI 20.98 kg/m   Physical Exam  Constitutional: She is oriented to person, place, and time. She appears well-developed and well-nourished. No distress.  HENT:  Head: Normocephalic and atraumatic.  Mouth/Throat: Oropharynx is clear and moist. No oropharyngeal exudate.  No obvious scalp injury.  No intraoral trauma.  Eyes: Pupils are equal, round, and reactive to light. EOM are normal.  No nystagmus  Neck: Normal range of motion. Neck supple.  No posterior midline cervical tenderness to palpation.  Cardiovascular: Regular rhythm.  Bradycardia  Pulmonary/Chest: Effort normal and breath sounds normal. No respiratory distress. She has no wheezes. She has no rales.  Abdominal: Soft. Bowel sounds are normal. She exhibits no distension and  no mass. There is tenderness. There is no rebound and no guarding.  Epigastric tenderness to palpation.  No rebound or guarding.  Musculoskeletal: Normal range of motion. She exhibits no edema or tenderness.  No midline thoracic or lumbar tenderness.  No CVA tenderness.  No lower extremity swelling, asymmetry or tenderness.  Distal pulses are 1+.  Neurological: She is alert and oriented to person, place, and time.  Moving all extremities without focal deficit.  Sensation fully intact.  Skin: Skin is warm and dry. Capillary refill takes less than 2 seconds. No rash noted. She is not diaphoretic. No erythema.  Psychiatric: She has a normal mood and affect. Her behavior is normal.  Nursing note and vitals reviewed.    ED Treatments / Results  Labs (all labs ordered are listed, but only abnormal results are displayed) Labs Reviewed  COMPREHENSIVE METABOLIC PANEL - Abnormal; Notable for the following components:      Result Value   Glucose, Bld 107 (*)    AST 62 (*)    All other components within normal limits  CBC - Abnormal; Notable for the following components:   RBC 5.65 (*)    MCV 73.6 (*)    MCH 23.2 (*)    RDW 15.7 (*)    All other components within normal limits  URINALYSIS, ROUTINE W REFLEX MICROSCOPIC - Abnormal; Notable for the following components:   Color, Urine STRAW (*)    Specific Gravity, Urine 1.036 (*)    Hgb urine dipstick SMALL (*)    All other components within normal limits  LIPASE, BLOOD  TROPONIN I    EKG EKG Interpretation  Date/Time:  Saturday August 24 2018 15:36:01 EST Ventricular Rate:  59 PR Interval:  170 QRS Duration: 60 QT Interval:  486 QTC Calculation: 481 R Axis:   76 Text Interpretation:  Sinus bradycardia Otherwise normal ECG Confirmed by Loren Racer ([id number]) on 08/24/2018 7:11:38 PM   Radiology Ct Abdomen Pelvis W Contrast  Result Date: 08/24/2018 CLINICAL DATA:  Upper abdominal pain and diaphoresis. EXAM: CT ABDOMEN AND  PELVIS WITH CONTRAST TECHNIQUE: Multidetector CT imaging of the abdomen and pelvis was performed using the standard protocol following bolus administration of intravenous contrast. CONTRAST:  ISOVUE-300 IOPAMIDOL (ISOVUE-300) INJECTION 61% COMPARISON:  12/14/2016 FINDINGS: Lower chest: Lung bases are within normal. Hepatobiliary: Previous cholecystectomy. Stable prominence of the central intrahepatic ducts and common bile duct post cholecystectomy. Several subcentimeter hypodensities throughout the liver unchanged likely cysts. Pancreas: Normal. Spleen: Normal. Adrenals/Urinary Tract: Adrenal glands are normal. Kidneys normal in size without hydronephrosis or nephrolithiasis. Subcentimeter hypodensity over the lower pole right kidney unchanged and  likely a cyst but too small to characterize. Ureters and bladder are normal. Stomach/Bowel: Stomach is normal. Small bowel is within normal. Appendix is normal. Colon is decompressed from the splenic flexure distally. Vascular/Lymphatic: Calcified plaque over the abdominal aorta. No adenopathy. Reproductive: Uterus is right of midline and retroverted with 1.7 cm mass over the fundus likely a fibroid and unchanged. Other: No free fluid or focal inflammatory change. Musculoskeletal: Unremarkable. IMPRESSION: No acute findings in the abdomen/pelvis. Multiple subcentimeter liver hypodensities too small to characterize but likely cysts and unchanged. Stable prominence of the central intrahepatic ducts and common bowel duct post cholecystectomy. Stable subcentimeter hypodensity over the lower pole right kidney likely a cyst but too small to characterize. Retroverted uterus with 1.7 cm fundal fibroid unchanged. Aortic Atherosclerosis (ICD10-I70.0). Electronically Signed   By: Elberta Fortisaniel  Boyle M.D.   On: 08/24/2018 17:55    Procedures Procedures (including critical care time)  Medications Ordered in ED Medications  sodium chloride 0.9 % bolus 1,000 mL (1,000 mLs  Intravenous New Bag/Given 08/24/18 1639)  iopamidol (ISOVUE-300) 61 % injection 100 mL (100 mLs Intravenous Contrast Given 08/24/18 1733)     Initial Impression / Assessment and Plan / ED Course  I have reviewed the triage vital signs and the nursing notes.  Pertinent labs & imaging results that were available during my care of the patient were reviewed by me and considered in my medical decision making (see chart for details).     With long history of syncope.  Has had extensive work-up with Dr. Graciela HusbandsKlein.  Initially borderline hypotensive.  This improved with IV fluids.  Symptoms likely related to mild dehydration and vasovagal syndrome.  Patient states she is feeling much better.  CT abdomen pelvis without acute findings.  She is advised to follow-up with Dr. Graciela HusbandsKlein.  Change positions slowly.  Avoid driving.  Strict return precautions given.  Final Clinical Impressions(s) / ED Diagnoses   Final diagnoses:  Syncope and collapse  Diarrhea, unspecified type  Generalized abdominal pain    ED Discharge Orders    None       Loren RacerYelverton, Jaxson Anglin, MD 08/24/18 1911

## 2018-08-24 NOTE — ED Notes (Signed)
From CT 

## 2019-07-10 ENCOUNTER — Other Ambulatory Visit: Payer: Self-pay | Admitting: *Deleted

## 2019-07-10 DIAGNOSIS — Z20822 Contact with and (suspected) exposure to covid-19: Secondary | ICD-10-CM

## 2019-07-11 LAB — NOVEL CORONAVIRUS, NAA: SARS-CoV-2, NAA: NOT DETECTED

## 2019-12-06 ENCOUNTER — Ambulatory Visit: Payer: Self-pay

## 2019-12-17 ENCOUNTER — Ambulatory Visit: Payer: Self-pay | Admitting: Family Medicine

## 2020-01-14 ENCOUNTER — Other Ambulatory Visit: Payer: Self-pay

## 2020-01-14 ENCOUNTER — Encounter (INDEPENDENT_AMBULATORY_CARE_PROVIDER_SITE_OTHER): Payer: Self-pay

## 2020-01-14 ENCOUNTER — Ambulatory Visit (INDEPENDENT_AMBULATORY_CARE_PROVIDER_SITE_OTHER): Payer: Medicare Other | Admitting: Family Medicine

## 2020-01-14 ENCOUNTER — Encounter: Payer: Self-pay | Admitting: Family Medicine

## 2020-01-14 ENCOUNTER — Encounter (INDEPENDENT_AMBULATORY_CARE_PROVIDER_SITE_OTHER): Payer: Self-pay | Admitting: *Deleted

## 2020-01-14 VITALS — BP 100/70 | Temp 97.4°F | Resp 15 | Ht 66.0 in | Wt 134.0 lb

## 2020-01-14 DIAGNOSIS — R7301 Impaired fasting glucose: Secondary | ICD-10-CM | POA: Insufficient documentation

## 2020-01-14 DIAGNOSIS — R9431 Abnormal electrocardiogram [ECG] [EKG]: Secondary | ICD-10-CM | POA: Insufficient documentation

## 2020-01-14 DIAGNOSIS — Z1211 Encounter for screening for malignant neoplasm of colon: Secondary | ICD-10-CM | POA: Insufficient documentation

## 2020-01-14 DIAGNOSIS — M25512 Pain in left shoulder: Secondary | ICD-10-CM | POA: Diagnosis not present

## 2020-01-14 DIAGNOSIS — Z780 Asymptomatic menopausal state: Secondary | ICD-10-CM

## 2020-01-14 DIAGNOSIS — Z124 Encounter for screening for malignant neoplasm of cervix: Secondary | ICD-10-CM | POA: Diagnosis not present

## 2020-01-14 DIAGNOSIS — Z1231 Encounter for screening mammogram for malignant neoplasm of breast: Secondary | ICD-10-CM

## 2020-01-14 DIAGNOSIS — F17219 Nicotine dependence, cigarettes, with unspecified nicotine-induced disorders: Secondary | ICD-10-CM

## 2020-01-14 DIAGNOSIS — Z13220 Encounter for screening for lipoid disorders: Secondary | ICD-10-CM

## 2020-01-14 DIAGNOSIS — Z136 Encounter for screening for cardiovascular disorders: Secondary | ICD-10-CM

## 2020-01-14 DIAGNOSIS — F17200 Nicotine dependence, unspecified, uncomplicated: Secondary | ICD-10-CM | POA: Insufficient documentation

## 2020-01-14 MED ORDER — METHYLPREDNISOLONE ACETATE 80 MG/ML IJ SUSP
80.0000 mg | Freq: Once | INTRAMUSCULAR | Status: AC
  Administered 2020-01-14: 80 mg via INTRAMUSCULAR

## 2020-01-14 MED ORDER — KETOROLAC TROMETHAMINE 60 MG/2ML IM SOLN
60.0000 mg | Freq: Once | INTRAMUSCULAR | Status: AC
  Administered 2020-01-14: 60 mg via INTRAMUSCULAR

## 2020-01-14 NOTE — Patient Instructions (Signed)
I appreciate the opportunity to provide you with care for your health and wellness. Today we discussed: establish care  Follow up: 4 months   No labs or referrals today  Please continue to practice social distancing to keep you, your family, and our community safe.  If you must go out, please wear a mask and practice good handwashing.  It was a pleasure to see you and I look forward to continuing to work together on your health and well-being. Please do not hesitate to call the office if you need care or have questions about your care.  Have a wonderful day and week. With Gratitude, Tereasa Coop, DNP, AGNP-BC

## 2020-01-14 NOTE — Assessment & Plan Note (Signed)
History of having QT prolongation, syncopal episodes.  In office EKG sinus bradycardia possible left atrial enlargement which is consistent with previous findings.  Referral back to cardiology for work-up as deemed necessary.

## 2020-01-14 NOTE — Assessment & Plan Note (Signed)
Concern for possible frozen shoulder versus impingement.  Provided with Toradol and Depo today in the office.  Referral to orthopedics as well as x-ray.

## 2020-01-14 NOTE — Progress Notes (Signed)
Subjective:  Patient ID: Stephanie Hendrix, female    DOB: 1956/03/31  Age: 64 y.o. MRN: [id number]  CC:  Chief Complaint  Patient presents with  . Establish Care  . Shoulder Pain    left shoulder pain x 1 month       HPI  HPI   Ms. Stephanie Hendrix is a 64 year old female patient who presents today to establish care.  Only history listed is tobacco use, syncope, allergy.  She denies having any falls.  But reports that she has pain in her left shoulder to elbow.  Reports that this has been going on for about a month and she almost has a point where she cannot lift the shoulder or rotate it anymore.  Aches constantly varies in intensity of how it aches.  Sometimes it is dull in nature sometimes it sharp if she moves it the wrong way.  She has trouble gripping and lifting the arm.  Heating pad at night helps some almost constant pain.  7 out of 10 today in the office.  Additionally she had had a work-up secondary to having a syncope has not followed up with cardiology since 2018.  Is willing to be referred back for further work-up if any needed.  EKG in the office today sinus bradycardia with atrial enlargement which was demonstrated on previous test.  She has not had a Pap smear is okay [facility].  Has not had a bone scan, mammogram or colonoscopy.  She reports that she is okay having all of these ordered.  She denies use of alcohol or drugs.  But does smoke about 1.5 to 2 packs of cigarettes a day.  She reports that she drinks a lot of coffee limited amount of water but tries to eat overall well.  Enjoy spending time with her grandchildren as she has 25 of them.  And 5 great grand's.  Outside of getting up-to-date on her health care gaps and her shoulder discomfort she has no other complaints or concerns.  Today patient denies signs and symptoms of COVID 19 infection including fever, chills, cough, shortness of breath, and headache. Past Medical, Surgical, Social History,  Allergies, and Medications have been Reviewed.   Past Medical History:  Diagnosis Date  . Allergy   . Syncope   . Tobacco abuse 12/22/2016    Current Meds  Medication Sig  . acetaminophen (TYLENOL) 325 MG tablet Take 650 mg by mouth every 6 (six) hours as needed for mild pain, moderate pain or headache.  . ibuprofen (ADVIL,MOTRIN) 200 MG tablet Take 200 mg by mouth every 6 (six) hours as needed for mild pain.  . [DISCONTINUED] diazepam (VALIUM) 5 MG tablet Take one tablet (5 mg) on arrival for MRI    ROS:  Review of Systems  Constitutional: Negative.   HENT: Negative.   Eyes: Negative.   Respiratory: Negative.   Cardiovascular: Negative.   Gastrointestinal: Negative.   Genitourinary: Negative.   Musculoskeletal: Positive for joint pain.  Skin: Negative.   Neurological: Negative.   Endo/Heme/Allergies: Negative.   Psychiatric/Behavioral: Negative.   All other systems reviewed and are negative.    Objective:   Today's Vitals: BP 100/70   Temp (!) 97.4 F (36.3 C) (Temporal)   Resp 15   Ht 5\' 6"  (1.676 m)   Wt 134 lb (60.8 kg)   SpO2 99%   BMI 21.63 kg/m  Vitals with BMI 01/14/2020 08/24/2018 08/24/2018  Height 5\' 6"  - -  Weight 134 lbs - -  BMI 01.02 - -  Systolic 725 366 440  Diastolic 70 70 76  Pulse - 50 60     Physical Exam Vitals and nursing note reviewed.  Constitutional:      Appearance: Normal appearance. She is obese.  HENT:     Head: Normocephalic and atraumatic.     Right Ear: External ear normal.     Left Ear: External ear normal.     Mouth/Throat:     Comments: Mask in place  Eyes:     General:        Right eye: No discharge.        Left eye: No discharge.     Conjunctiva/sclera: Conjunctivae normal.  Cardiovascular:     Rate and Rhythm: Normal rate and regular rhythm.     Pulses: Normal pulses.     Heart sounds: Normal heart sounds.  Pulmonary:     Effort: Pulmonary effort is normal.     Breath sounds: Normal breath sounds.   Musculoskeletal:        General: Tenderness present.     Right shoulder: Normal.     Left shoulder: Tenderness present. Decreased range of motion. Decreased strength.     Cervical back: Normal range of motion and neck supple.  Skin:    General: Skin is warm.  Neurological:     General: No focal deficit present.     Mental Status: She is alert and oriented to person, place, and time.  Psychiatric:        Mood and Affect: Mood normal.        Behavior: Behavior normal.        Thought Content: Thought content normal.        Judgment: Judgment normal.     Assessment   1. Acute pain of left shoulder   2. Encounter for mammogram to establish baseline mammogram   3. Encounter for screening for malignant neoplasm of cervix   4. Post-menopausal   5. Impaired fasting glucose   6. Encounter for lipid screening for cardiovascular disease   7. QT prolongation   8. Encounter for screening for malignant neoplasm of colon   9. Cigarette nicotine dependence with nicotine-induced disorder     Tests ordered Orders Placed This Encounter  Procedures  . MM 3D SCREEN BREAST BILATERAL  . DG Shoulder Left  . DG Bone Density  . CBC  . COMPLETE METABOLIC PANEL WITH GFR  . Hemoglobin A1c  . Lipid panel  . Ambulatory referral to Orthopedic Surgery  . Ambulatory referral to Cardiology  . Ambulatory referral to Obstetrics / Gynecology  . Ambulatory referral to Gastroenterology     Plan: Please see assessment and plan per problem list above.   Meds ordered this encounter  Medications  . ketorolac (TORADOL) injection 60 mg  . methylPREDNISolone acetate (DEPO-MEDROL) injection 80 mg    Patient to follow-up in 05/14/2020   Perlie Mayo, NP

## 2020-01-14 NOTE — Assessment & Plan Note (Signed)
Dexa scan ordered. 

## 2020-01-14 NOTE — Assessment & Plan Note (Signed)
Asked about quitting: confirms they are currently smokes cigarettes Advise to quit smoking: Educated about QUITTING to reduce the risk of cancer, cardio and cerebrovascular disease. Assess willingness: Unwilling to quit at this time, but is working on cutting back. Assist with counseling and pharmacotherapy: Counseled for 5 minutes and literature provided. Arrange for follow up:  not quitting follow up in 3 months and continue to offer help.   

## 2020-01-15 ENCOUNTER — Ambulatory Visit (INDEPENDENT_AMBULATORY_CARE_PROVIDER_SITE_OTHER): Payer: Medicare Other | Admitting: Orthopaedic Surgery

## 2020-01-15 ENCOUNTER — Ambulatory Visit: Payer: Medicare Other

## 2020-01-15 ENCOUNTER — Encounter: Payer: Self-pay | Admitting: Orthopaedic Surgery

## 2020-01-15 VITALS — BP 128/82 | HR 49 | Temp 97.0°F | Ht 66.5 in | Wt 135.2 lb

## 2020-01-15 DIAGNOSIS — M25512 Pain in left shoulder: Secondary | ICD-10-CM

## 2020-01-15 DIAGNOSIS — G8929 Other chronic pain: Secondary | ICD-10-CM

## 2020-01-15 DIAGNOSIS — F17210 Nicotine dependence, cigarettes, uncomplicated: Secondary | ICD-10-CM | POA: Diagnosis not present

## 2020-01-15 DIAGNOSIS — M7502 Adhesive capsulitis of left shoulder: Secondary | ICD-10-CM | POA: Diagnosis not present

## 2020-01-15 MED ORDER — NAPROXEN 500 MG PO TABS: 500.0000 mg | ORAL_TABLET | Freq: Two times a day (BID) | ORAL | 5 refills | Status: DC

## 2020-01-15 NOTE — Addendum Note (Signed)
Addended by: Jodene Nam A on: 01/15/2020 04:18 PM   Modules accepted: Orders

## 2020-01-15 NOTE — Progress Notes (Signed)
Subjective:    Patient ID: Stephanie Hendrix, female    DOB: 08-02-1956, 64 y.o.   MRN: [id number]  HPI She has history of increasing pain in the left shoulder for about two months.  It is getting harder to raise the arm up.  She has no trauma.  She has no swelling or numbness.  She has been seen at [facility].  I have reviewed the notes.  She has tried Advil, heat, ice, Tylenol, rest with no help.  She has no redness.  She has no other joint pain.   Review of Systems  Constitutional: Positive for activity change.  Musculoskeletal: Positive for arthralgias.  All other systems reviewed and are negative.  For Review of Systems, all other systems reviewed and are negative.  The following is a summary of the past history medically, past history surgically, known current medicines, social history and family history.  This information is gathered electronically by the computer from prior information and documentation.  I review this each visit and have found including this information at this point in the chart is beneficial and informative.   Past Medical History:  Diagnosis Date  . Allergy   . Syncope   . Tobacco abuse 12/22/2016    Past Surgical History:  Procedure Laterality Date  . CHOLECYSTECTOMY      Current Outpatient Medications on File Prior to Visit  Medication Sig Dispense Refill  . acetaminophen (TYLENOL) 325 MG tablet Take 650 mg by mouth every 6 (six) hours as needed for mild pain, moderate pain or headache.    . ibuprofen (ADVIL,MOTRIN) 200 MG tablet Take 200 mg by mouth every 6 (six) hours as needed for mild pain.     No current facility-administered medications on file prior to visit.    Social History   Socioeconomic History  . Marital status: Divorced    Spouse name: Not on file  . Number of children: Not on file  . Years of education: Not on file  . Highest education level: Not on file  Occupational History  . Not on file  Tobacco Use  .  Smoking status: Current Every Day Smoker    Packs/day: 1.00    Years: 43.00    Pack years: 43.00    Types: Cigarettes  . Smokeless tobacco: Never Used  Substance and Sexual Activity  . Alcohol use: No  . Drug use: No  . Sexual activity: Not Currently  Other Topics Concern  . Not on file  Social History Narrative  . Not on file   Social Determinants of Health   Financial Resource Strain:   . Difficulty of Paying Living Expenses:   Food Insecurity:   . Worried About Programme researcher, broadcasting/film/video in the Last Year:   . Barista in the Last Year:   Transportation Needs:   . Freight forwarder (Medical):   Marland Kitchen Lack of Transportation (Non-Medical):   Physical Activity:   . Days of Exercise per Week:   . Minutes of Exercise per Session:   Stress:   . Feeling of Stress :   Social Connections:   . Frequency of Communication with Friends and Family:   . Frequency of Social Gatherings with Friends and Family:   . Attends Religious Services:   . Active Member of Clubs or Organizations:   . Attends Banker Meetings:   Marland Kitchen Marital Status:   Intimate Partner Violence:   . Fear of Current or Ex-Partner:   .  Emotionally Abused:   Marland Kitchen Physically Abused:   . Sexually Abused:     Family History  Problem Relation Age of Onset  . COPD Mother   . Cancer Father   . Breast cancer Maternal Grandmother   . COPD Brother     BP 128/82   Pulse (!) 49   Temp (!) 97 F (36.1 C)   Ht 5' 6.5" (1.689 m)   Wt 135 lb 4 oz (61.3 kg)   BMI 21.50 kg/m   Body mass index is 21.5 kg/m.     Objective:   Physical Exam Vitals and nursing note reviewed.  Constitutional:      Appearance: She is well-developed.  HENT:     Head: Normocephalic and atraumatic.  Eyes:     Conjunctiva/sclera: Conjunctivae normal.     Pupils: Pupils are equal, round, and reactive to light.  Cardiovascular:     Rate and Rhythm: Normal rate and regular rhythm.  Pulmonary:     Effort: Pulmonary effort is  normal.  Abdominal:     Palpations: Abdomen is soft.  Musculoskeletal:       Arms:     Cervical back: Normal range of motion and neck supple.  Skin:    General: Skin is warm and dry.  Neurological:     Mental Status: She is alert and oriented to person, place, and time.     Cranial Nerves: No cranial nerve deficit.     Motor: No abnormal muscle tone.     Coordination: Coordination normal.     Deep Tendon Reflexes: Reflexes are normal and symmetric. Reflexes normal.  Psychiatric:        Behavior: Behavior normal.        Thought Content: Thought content normal.        Judgment: Judgment normal.    X-rays were done of the left shoulder, reported separately.       Assessment & Plan:   Encounter Diagnoses  Name Primary?  . Chronic left shoulder pain Yes  . Adhesive capsulitis of left shoulder   . Nicotine dependence, cigarettes, uncomplicated    PROCEDURE NOTE:  The patient request injection, verbal consent was obtained.  The left shoulder was prepped appropriately after time out was performed.   Sterile technique was observed and injection of 1 cc of Depo-Medrol 40 mg with several cc's of plain xylocaine. Anesthesia was provided by ethyl chloride and a 20-gauge needle was used to inject the shoulder area. A posterior approach was used.  The injection was tolerated well.  A band aid dressing was applied.  The patient was advised to apply ice later today and tomorrow to the injection sight as needed.  I will call in naprosyn 500 po bid pc.  Begin OT.  Return in three weeks.  Call if any problem.  Precautions discussed.   Electronically Signed Sanjuana Kava, MD 4/22/20212:11 PM

## 2020-01-15 NOTE — Patient Instructions (Signed)

## 2020-01-22 ENCOUNTER — Telehealth: Payer: Self-pay | Admitting: Adult Health

## 2020-01-22 NOTE — Telephone Encounter (Signed)

## 2020-01-23 ENCOUNTER — Other Ambulatory Visit: Payer: Self-pay

## 2020-01-23 ENCOUNTER — Ambulatory Visit (INDEPENDENT_AMBULATORY_CARE_PROVIDER_SITE_OTHER): Payer: Medicare Other | Admitting: Adult Health

## 2020-01-23 ENCOUNTER — Other Ambulatory Visit (HOSPITAL_COMMUNITY)
Admission: RE | Admit: 2020-01-23 | Discharge: 2020-01-23 | Disposition: A | Discharge status: Home / Self Care | Payer: Medicare Other | Source: Ambulatory Visit | Attending: Adult Health | Admitting: Adult Health

## 2020-01-23 ENCOUNTER — Encounter: Payer: Self-pay | Admitting: Adult Health

## 2020-01-23 VITALS — BP 117/80 | HR 56 | Ht 66.5 in | Wt 135.0 lb

## 2020-01-23 DIAGNOSIS — Z1151 Encounter for screening for human papillomavirus (HPV): Secondary | ICD-10-CM | POA: Insufficient documentation

## 2020-01-23 DIAGNOSIS — Z716 Tobacco abuse counseling: Secondary | ICD-10-CM

## 2020-01-23 DIAGNOSIS — Z720 Tobacco use: Secondary | ICD-10-CM | POA: Insufficient documentation

## 2020-01-23 DIAGNOSIS — Z1212 Encounter for screening for malignant neoplasm of rectum: Secondary | ICD-10-CM | POA: Diagnosis not present

## 2020-01-23 DIAGNOSIS — Z01419 Encounter for gynecological examination (general) (routine) without abnormal findings: Secondary | ICD-10-CM | POA: Insufficient documentation

## 2020-01-23 DIAGNOSIS — Z780 Asymptomatic menopausal state: Secondary | ICD-10-CM | POA: Insufficient documentation

## 2020-01-23 DIAGNOSIS — Z1211 Encounter for screening for malignant neoplasm of colon: Secondary | ICD-10-CM | POA: Diagnosis not present

## 2020-01-23 DIAGNOSIS — F17200 Nicotine dependence, unspecified, uncomplicated: Secondary | ICD-10-CM | POA: Diagnosis not present

## 2020-01-23 DIAGNOSIS — Z1272 Encounter for screening for malignant neoplasm of vagina: Secondary | ICD-10-CM

## 2020-01-23 LAB — HEMOCCULT GUIAC POC 1CARD (OFFICE): Fecal Occult Blood, POC: NEGATIVE

## 2020-01-23 MED ORDER — CHANTIX STARTING MONTH PAK 0.5 MG X 11 & 1 MG X 42 PO TABS: ORAL_TABLET | ORAL | 0 refills | Status: DC

## 2020-01-23 NOTE — Addendum Note (Signed)
Addended by: Annamarie Dawley on: 01/23/2020 11:46 AM   Modules accepted: Orders

## 2020-01-23 NOTE — Progress Notes (Addendum)
Patient ID: Stephanie Hendrix, female   DOB: April 21, 1956, 64 y.o.   MRN: [id number] History of Present Illness: Stephanie Hendrix is a 41 year old black female, divorced, PM, new pt, in for pelvic exam and pap. PCP is Tereasa Coop NP   Current Medications, Allergies, Past Medical History, Past Surgical History, Family History and Social History were reviewed in [employer] record.     Review of Systems: Patient denies any headaches, hearing loss, fatigue, blurred vision, shortness of breath, chest pain, abdominal pain, problems with bowel movements, urination, or intercourse(not active). No joint pain or mood swings. She is a smoker.   Physical Exam:BP 117/80 (BP Location: Left Arm, Patient Position: Sitting, Cuff Size: Normal)   Pulse (!) 56   Ht 5' 6.5" (1.689 m)   Wt 135 lb (61.2 kg)   BMI 21.46 kg/m  General:  Well developed, well nourished, no acute distress Skin:  Warm and dry Neck:  Midline trachea, normal thyroid, good ROM, no lymphadenopathy,no carotid bruits heard Lungs; Clear to auscultation bilaterally Cardiovascular: Regular rate and rhythm Pelvic:  External genitalia is normal in appearance, no lesions.  The vagina is pale with loss of moisture and rugae. Urethra has no lesions or masses. The cervix is smooth, pap with high risk HPV 16/18 genotyping performed.  Uterus is felt to be normal size, shape, and contour.  No adnexal masses or tenderness noted.Bladder is non tender, no masses felt. Rectal: Good sphincter tone, no polyps, or hemorrhoids felt.  Hemoccult negative. Extremities/musculoskeletal:  No swelling or varicosities noted, no clubbing or cyanosis Psych:  No mood changes, alert and cooperative,seems happy AA 0 Fall risk is low PHQ 9 score is 0. Examination chaperoned by Faith Rogue LPN   Impression and Plan:  1. Encounter for gynecological examination with Papanicolaou smear of cervix Pap sent Physical with PCP Pap in 3 years if normal Get  mammogram Labs with PCP 2. Screening for colorectal cancer To get colonoscopy she said PCP to refer   3. Smoker She is ready to quit, tried old [place] and it did not work   4. Encounter for smoking cessation counseling Discussed using chantix and making a date to stop and will fax form to the [facility] Meds ordered this encounter  Medications  . varenicline (CHANTIX STARTING MONTH PAK) 0.5 MG X 11 & 1 MG X 42 tablet    Sig: Take one 0.5 mg tablet by mouth once daily for 3 days, then increase to one 0.5 mg tablet twice daily for 4 days, then increase to one 1 mg tablet twice daily.    Dispense:  53 tablet    Refill:  0    Order Specific Question:   Supervising Provider    Answer:   Lazaro Arms [[id number]]     addend:smoking cessation counseling was 3-10 minutes, with rx given

## 2020-01-27 ENCOUNTER — Ambulatory Visit (HOSPITAL_COMMUNITY): Payer: Medicare Other | Attending: Orthopaedic Surgery | Admitting: Occupational Therapy

## 2020-01-27 ENCOUNTER — Other Ambulatory Visit: Payer: Self-pay

## 2020-01-27 ENCOUNTER — Encounter (HOSPITAL_COMMUNITY): Payer: Self-pay | Admitting: Occupational Therapy

## 2020-01-27 DIAGNOSIS — R29898 Other symptoms and signs involving the musculoskeletal system: Secondary | ICD-10-CM | POA: Diagnosis present

## 2020-01-27 DIAGNOSIS — M25612 Stiffness of left shoulder, not elsewhere classified: Secondary | ICD-10-CM | POA: Diagnosis present

## 2020-01-27 DIAGNOSIS — M25512 Pain in left shoulder: Secondary | ICD-10-CM | POA: Diagnosis not present

## 2020-01-27 LAB — CYTOLOGY - PAP
Comment: NEGATIVE
Diagnosis: NEGATIVE
High risk HPV: NEGATIVE

## 2020-01-27 NOTE — Therapy (Signed)
[facility] [address] [place], [place], [address] Phone: [phone]   Fax:  [phone]  Occupational Therapy Evaluation  Patient Details  Name: Stephanie Hendrix MRN: [id number] Date of Birth: Jan 17, 1956 Referring Provider (OT): Dr. Darreld Mclean   Encounter Date: 01/27/2020  OT End of Session - 01/27/20 1549    Visit Number  1    Number of Visits  16    Date for OT Re-Evaluation  03/27/20   02/25/20   Authorization Type  1) Medicare A & B 2) Medicaid    Authorization Time Period  no visit limit    Progress Note Due on Visit  10    OT Start Time  1513    OT Stop Time  1545    OT Time Calculation (min)  32 min    Activity Tolerance  Patient tolerated treatment well    Behavior During Therapy  [facility] for tasks assessed/performed       Past Medical History:  Diagnosis Date  . Allergy   . Syncope   . Tobacco abuse 12/22/2016    Past Surgical History:  Procedure Laterality Date  . CHOLECYSTECTOMY      There were no vitals filed for this visit.  Subjective Assessment - 01/27/20 1546    Subjective   S: I had a shot but I don't think it helped much.    Pertinent History  Pt is a 64 y/o female presenting with left shoulder pain secondary to adhesive capsulitis. Pt reports pain for approximately 2 months, received cortisone injection with limited relief. Pt was referred to occupational therapy for evaluation and treatment by Dr. Darreld Mclean.    Special Tests  FOTO: 28/100    Patient Stated Goals  To have less pain in my arm.    Currently in Pain?  Yes    Pain Score  5     Pain Location  Shoulder    Pain Orientation  Left    Pain Descriptors / Indicators  Aching;Sore    Pain Type  Acute pain    Pain Radiating Towards  elbow    Pain Onset  More than a month ago    Pain Frequency  Intermittent    Aggravating Factors   movement, use    Pain Relieving Factors  heat, pain medication    Effect of Pain on Daily Activities  mod effect  on ADLs    Multiple Pain Sites  No        [facility] OT Assessment - 01/27/20 1509      Assessment   Medical Diagnosis  left shoulder adhesive capsulitis    Referring Provider (OT)  Dr. Darreld Mclean    Onset Date/Surgical Date  11/27/19    Hand Dominance  Right    Next MD Visit  02/05/20    Prior Therapy  None      Precautions   Precautions  None      Restrictions   Weight Bearing Restrictions  No      Balance Screen   Has the patient fallen in the past 6 months  No    Has the patient had a decrease in activity level because of a fear of falling?   No    Is the patient reluctant to leave their home because of a fear of falling?   No      Prior Function   Level of Independence  Independent    Vocation  Retired  Leisure  caring for grandchildren      ADL   ADL comments  Pt is having difficulty with reaching up to fix hair, reaching behind her back, dressing tasks, bathing tasks, driving, and sleeping. Pt is unable to pick up grandchildren, lifting heavy objects is difficult.       Written Expression   Dominant Hand  Right      Cognition   Overall Cognitive Status  Within Functional Limits for tasks assessed      Observation/Other Assessments   Focus on Therapeutic Outcomes (FOTO)   28/100      ROM / Strength   AROM / PROM / Strength  AROM;PROM;Strength      Palpation   Palpation comment  mod fascial restrictions in left upper arm, anterior shoulder, and scapular regions      AROM   Overall AROM Comments  Assessed seated, er/IR adducted    AROM Assessment Site  Shoulder    Right/Left Shoulder  Left    Left Shoulder Flexion  86 Degrees    Left Shoulder ABduction  60 Degrees    Left Shoulder Internal Rotation  90 Degrees    Left Shoulder External Rotation  62 Degrees      PROM   Overall PROM Comments  Assessed supine, er/IR adducted    PROM Assessment Site  Shoulder    Right/Left Shoulder  Left    Left Shoulder Flexion  112 Degrees    Left Shoulder ABduction  76  Degrees    Left Shoulder Internal Rotation  90 Degrees    Left Shoulder External Rotation  62 Degrees      Strength   Overall Strength Comments  Assessed seated, er/IR adducted    Strength Assessment Site  Shoulder    Right/Left Shoulder  Left    Left Shoulder Flexion  3-/5    Left Shoulder ABduction  3-/5    Left Shoulder Internal Rotation  4-/5    Left Shoulder External Rotation  3/5                      OT Education - 01/27/20 1534    Education Details  table slides, scapular A/ROM    Person(s) Educated  Patient    Methods  Explanation;Demonstration;Handout    Comprehension  Verbalized understanding;Returned demonstration       OT Short Term Goals - 01/27/20 1552      OT SHORT TERM GOAL #1   Title  Pt will be provided with and educated on HEP to improve mobility required for LUE use during ADL tasks.    Time  4    Period  Weeks    Status  New    Target Date  02/26/20      OT SHORT TERM GOAL #2   Title  Pt will increase LUE P/ROM to [facility] to improve ability to use LUE as assist during dressing tasks.    Time  4    Period  Weeks    Status  New      OT SHORT TERM GOAL #3   Title  Pt will decrease LUE pain to 5/10 to improve ability to use LUE as assist when driving.    Time  4    Period  Weeks    Status  New      OT SHORT TERM GOAL #4   Title  Pt will increase LUE strength to 3+/5 or greater to improve ability to complete meal preparation tasks.  Time  4    Period  Weeks    Status  New        OT Long Term Goals - 01/27/20 1553      OT LONG TERM GOAL #1   Title  Pt will increase LUE A/ROM to [facility] to improve ability to reaching behind head and behind back during bathing tasks.    Time  8    Period  Weeks    Status  New    Target Date  03/27/20      OT LONG TERM GOAL #2   Title  Pt will decrease LUE fascial restrictions to min amounts or less to improve mobility required for functional reaching tasks.    Time  8    Period  Weeks    Status   New      OT LONG TERM GOAL #3   Title  Pt will decrease pain in LUE to 2/10 to improve ability to sleep for 4 consecutive hours or greater at night.    Time  8    Period  Weeks    Status  New      OT LONG TERM GOAL #4   Title  Pt will increase LUE strength to 4+/5 or greater to improve ability to lift and hold grandchildren.    Time  8    Period  Weeks    Status  New            Plan - 01/27/20 1550    Clinical Impression Statement  A: Pt is a 64 y/o female presenting with left shoulder pain for >2 months limiting ability to complete functional tasks using LUE. Pt reports limited ability to care for young grandchildren as well.    OT Occupational Profile and History  Problem Focused Assessment - Including review of records relating to presenting problem    Occupational performance deficits (Please refer to evaluation for details):  ADL's;IADL's;Leisure    Body Structure / Function / Physical Skills  ADL;Endurance;UE functional use;Fascial restriction;Pain;ROM;IADL;Strength    Rehab Potential  Good    Clinical Decision Making  Limited treatment options, no task modification necessary    Comorbidities Affecting Occupational Performance:  None    Modification or Assistance to Complete Evaluation   No modification of tasks or assist necessary to complete eval    OT Frequency  2x / week    OT Duration  8 weeks    OT Treatment/Interventions  Self-care/ADL training;Ultrasound;Patient/family education;Passive range of motion;Cryotherapy;Electrical Stimulation;Moist Heat;Therapeutic exercise;Manual Therapy;Therapeutic activities    Plan  P: Pt will benefit from skilled OT services to decrease pain and fascial restrictions, increase joint ROM, strength, and functional use of LUE. Treatment plan: myofascial release, manual techniques, P/ROM, AA/ROM, A/ROM, scapular mobility and strengthening, general LUE strengthening, modalities prn    OT Home Exercise Plan  eval: table slides, scapular A/ROM     Consulted and Agree with Plan of Care  Patient       Patient will benefit from skilled therapeutic intervention in order to improve the following deficits and impairments:   Body Structure / Function / Physical Skills: ADL, Endurance, UE functional use, Fascial restriction, Pain, ROM, IADL, Strength       Visit Diagnosis: Acute pain of left shoulder  Stiffness of left shoulder, not elsewhere classified  Other symptoms and signs involving the musculoskeletal system    Problem List Patient Active Problem List   Diagnosis Date Noted  . Encounter for gynecological examination with Papanicolaou smear  of cervix 01/23/2020  . Screening for colorectal cancer 01/23/2020  . Smoker 01/23/2020  . Encounter for smoking cessation counseling 01/23/2020  . Acute pain of left shoulder 01/14/2020  . Post-menopausal 01/14/2020  . Encounter for screening for malignant neoplasm of cervix 01/14/2020  . Encounter for mammogram to establish baseline mammogram 01/14/2020  . Impaired fasting glucose 01/14/2020  . Nicotine addiction 01/14/2020  . Encounter for lipid screening for cardiovascular disease 01/14/2020  . QT prolongation 01/14/2020  . Encounter for screening for malignant neoplasm of colon 01/14/2020   Ezra Sites, OTR/L  [phone] 01/27/2020, 3:56 PM  [facility] [address] [place], [place], [address] Phone: [phone]   Fax:  [phone]  Name: WYNELL HALBERG MRN: [id number] Date of Birth: Sep 08, 1956

## 2020-01-27 NOTE — Patient Instructions (Signed)
1) SHOULDER: Flexion On Table   Place hands on towel placed on table, elbows straight. Lean forward with you upper body, pushing towel away from body.  _15__ reps per set, _3__ sets per day  2) Abduction (Passive)   With arm out to side, resting on towel placed on table with palm DOWN, keeping trunk away from table, lean to the side while pushing towel away from body.  Repeat __15__ times. Do _3___ sessions per day.  Copyright  VHI. All rights reserved.     3) Internal Rotation (Assistive)   Seated with elbow bent at right angle and held against side, slide arm on table surface in an inward arc keeping elbow anchored in place. Repeat __15__ times. Do __3__ sessions per day. Activity: Use this motion to brush crumbs off the table.  Copyright  VHI. All rights reserved.    Scapular A/ROM  1) Seated Row   Sit up straight with elbows by your sides. Pull back with shoulders/elbows, keeping forearms straight, as if pulling back on the reins of a horse. Squeeze shoulder blades together. Repeat _10-15__times, __2-3__sets/day    2) Shoulder Elevation    Sit up straight with arms by your sides. Slowly bring your shoulders up towards your ears. Repeat_10-15__times, __2-3__ sets/day    3) Shoulder Extension    Sit up straight with both arms by your side, draw your arms back behind your waist. Keep your elbows straight. Repeat __10-15__times, __2-3__sets/day.

## 2020-01-28 ENCOUNTER — Encounter (HOSPITAL_COMMUNITY): Payer: Self-pay | Admitting: Occupational Therapy

## 2020-01-28 ENCOUNTER — Ambulatory Visit (HOSPITAL_COMMUNITY): Payer: Medicare Other | Admitting: Occupational Therapy

## 2020-01-28 DIAGNOSIS — M25612 Stiffness of left shoulder, not elsewhere classified: Secondary | ICD-10-CM

## 2020-01-28 DIAGNOSIS — M25512 Pain in left shoulder: Secondary | ICD-10-CM | POA: Diagnosis not present

## 2020-01-28 DIAGNOSIS — R29898 Other symptoms and signs involving the musculoskeletal system: Secondary | ICD-10-CM

## 2020-01-28 NOTE — Therapy (Signed)
[facility] [address] [place], [place], [address] Phone: [phone]   Fax:  [phone]  Occupational Therapy Treatment  Patient Details  Name: Stephanie Hendrix MRN: [id number] Date of Birth: 1955-11-27 Referring Provider (OT): Dr. Darreld Mclean   Encounter Date: 01/28/2020  OT End of Session - 01/28/20 1555    Visit Number  2    Number of Visits  16    Date for OT Re-Evaluation  03/27/20   02/25/20   Authorization Type  1) Medicare A & B 2) Medicaid    Authorization Time Period  no visit limit    Progress Note Due on Visit  10    OT Start Time  1518    OT Stop Time  1556    OT Time Calculation (min)  38 min    Activity Tolerance  Patient tolerated treatment well    Behavior During Therapy  [facility] for tasks assessed/performed       Past Medical History:  Diagnosis Date  . Allergy   . Syncope   . Tobacco abuse 12/22/2016    Past Surgical History:  Procedure Laterality Date  . CHOLECYSTECTOMY      There were no vitals filed for this visit.  Subjective Assessment - 01/28/20 1516    Subjective   S: I could stretch this morning.    Currently in Pain?  Yes    Pain Score  2     Pain Location  Shoulder    Pain Orientation  Left    Pain Descriptors / Indicators  Throbbing    Pain Type  Acute pain    Pain Radiating Towards  elbow    Pain Onset  More than a month ago    Pain Frequency  Intermittent    Aggravating Factors   movement, use, holding grandbaby    Pain Relieving Factors  heat, pain medication    Effect of Pain on Daily Activities  mod effect on ADLs    Multiple Pain Sites  No         [facility] OT Assessment - 01/28/20 1516      Assessment   Medical Diagnosis  left shoulder adhesive capsulitis      Precautions   Precautions  None               OT Treatments/Exercises (OP) - 01/28/20 1520      Exercises   Exercises  Shoulder      Shoulder Exercises: Supine   Protraction  PROM;AAROM;10 reps    Horizontal ABduction  PROM;AAROM;10 reps    External Rotation  PROM;AAROM;10 reps    Internal Rotation  PROM;AAROM;10 reps    Flexion  PROM;AAROM;10 reps    ABduction  PROM;AAROM;10 reps      Shoulder Exercises: Pulleys   Flexion  1 minute    ABduction  1 minute      Shoulder Exercises: ROM/Strengthening   Other ROM/Strengthening Exercises  PVC pipe slide: 10X      Shoulder Exercises: Stretch   External Rotation Stretch  3 reps;10 seconds    Wall Stretch - Flexion  3 reps;10 seconds    Wall Stretch - ABduction  3 reps;10 seconds    Other Shoulder Stretches  doorway stretch, 3X, 10" holds      Manual Therapy   Manual Therapy  Myofascial release    Manual therapy comments  completed separately from therapeutic exercises    Myofascial Release  myofascial release to left upper arm,  anterior shoulder, trapezius, and scapualr regions to decrease pain and fascial restrictions and increase joint ROM.                OT Short Term Goals - 01/28/20 1537      OT SHORT TERM GOAL #1   Title  Pt will be provided with and educated on HEP to improve mobility required for LUE use during ADL tasks.    Time  4    Period  Weeks    Status  On-going    Target Date  02/26/20      OT SHORT TERM GOAL #2   Title  Pt will increase LUE P/ROM to [facility] to improve ability to use LUE as assist during dressing tasks.    Time  4    Period  Weeks    Status  On-going      OT SHORT TERM GOAL #3   Title  Pt will decrease LUE pain to 5/10 to improve ability to use LUE as assist when driving.    Time  4    Period  Weeks    Status  On-going      OT SHORT TERM GOAL #4   Title  Pt will increase LUE strength to 3+/5 or greater to improve ability to complete meal preparation tasks.    Time  4    Period  Weeks    Status  On-going        OT Long Term Goals - 01/28/20 1537      OT LONG TERM GOAL #1   Title  Pt will increase LUE A/ROM to [facility] to improve ability to reaching behind head and behind back  during bathing tasks.    Time  8    Period  Weeks    Status  On-going      OT LONG TERM GOAL #2   Title  Pt will decrease LUE fascial restrictions to min amounts or less to improve mobility required for functional reaching tasks.    Time  8    Period  Weeks    Status  On-going      OT LONG TERM GOAL #3   Title  Pt will decrease pain in LUE to 2/10 to improve ability to sleep for 4 consecutive hours or greater at night.    Time  8    Period  Weeks    Status  On-going      OT LONG TERM GOAL #4   Title  Pt will increase LUE strength to 4+/5 or greater to improve ability to lift and hold grandchildren.    Time  8    Period  Weeks    Status  On-going            Plan - 01/28/20 1537    Clinical Impression Statement  A: Pt reports she has completed HEP and was able to stretch her shoulder this morning. Improvement in fascial restrictions in upper arm noted since evaluation yesterday. Initiated myofascial release, passive stretching, and AA/ROM supine today. Pt completed shoulder stretches in standing as well as PVC pipe slide and pulleys. Good gains in ROM during tasks, verbal cuing for form and technique.    Body Structure / Function / Physical Skills  ADL;Endurance;UE functional use;Fascial restriction;Pain;ROM;IADL;Strength    Plan  P: Continue with AA/ROM supine and add in standing. Update HEP for AA/ROM, add wall wash    OT Home Exercise Plan  eval: table slides, scapular A/ROM  Consulted and Agree with Plan of Care  Patient       Patient will benefit from skilled therapeutic intervention in order to improve the following deficits and impairments:   Body Structure / Function / Physical Skills: ADL, Endurance, UE functional use, Fascial restriction, Pain, ROM, IADL, Strength       Visit Diagnosis: Acute pain of left shoulder  Stiffness of left shoulder, not elsewhere classified  Other symptoms and signs involving the musculoskeletal system    Problem List Patient  Active Problem List   Diagnosis Date Noted  . Encounter for gynecological examination with Papanicolaou smear of cervix 01/23/2020  . Screening for colorectal cancer 01/23/2020  . Smoker 01/23/2020  . Encounter for smoking cessation counseling 01/23/2020  . Acute pain of left shoulder 01/14/2020  . Post-menopausal 01/14/2020  . Encounter for screening for malignant neoplasm of cervix 01/14/2020  . Encounter for mammogram to establish baseline mammogram 01/14/2020  . Impaired fasting glucose 01/14/2020  . Nicotine addiction 01/14/2020  . Encounter for lipid screening for cardiovascular disease 01/14/2020  . QT prolongation 01/14/2020  . Encounter for screening for malignant neoplasm of colon 01/14/2020   Ezra Sites, OTR/L  [phone] 01/28/2020, 3:57 PM  [facility] [address] [place], [place], [address] Phone: [phone]   Fax:  [phone]  Name: META KROENKE MRN: [id number] Date of Birth: 07-31-1956

## 2020-01-30 ENCOUNTER — Other Ambulatory Visit: Payer: Self-pay

## 2020-01-30 ENCOUNTER — Encounter: Payer: Self-pay | Admitting: Cardiology

## 2020-01-30 ENCOUNTER — Ambulatory Visit (INDEPENDENT_AMBULATORY_CARE_PROVIDER_SITE_OTHER): Payer: Medicare Other | Admitting: Cardiology

## 2020-01-30 VITALS — BP 110/78 | HR 56 | Ht 66.5 in | Wt 133.8 lb

## 2020-01-30 DIAGNOSIS — R55 Syncope and collapse: Secondary | ICD-10-CM | POA: Diagnosis not present

## 2020-01-30 NOTE — Progress Notes (Signed)
Clinical Summary Ms. Branscum is a 64 y.o.female seen for follow up of the following medical problems. Last seen by Dr Caryl Comes 02/08/17  1. Syncope - prior episodes of syncope in 2018, was evaluated by Dr Oval Linsey and then EP Dr Caryl Comes.  - it was thought most likely neurally mediated syncope, though some episodes were more questionable about a possible cardiac etiology. QTc was 480 around that time, was unclear if could be playing a role - no recurent symptom since 2018, no presyncope either   2. PFO - noted by echo and MRI - no significant RV enlargement or dysfunction  Past Medical History:  Diagnosis Date  . Allergy   . Syncope   . Tobacco abuse 12/22/2016     Allergies  Allergen Reactions  . Neomycin Rash     Current Outpatient Medications  Medication Sig Dispense Refill  . naproxen (NAPROSYN) 500 MG tablet Take 1 tablet (500 mg total) by mouth 2 (two) times daily with a meal. 60 tablet 5  . varenicline (CHANTIX STARTING MONTH PAK) 0.5 MG X 11 & 1 MG X 42 tablet Take one 0.5 mg tablet by mouth once daily for 3 days, then increase to one 0.5 mg tablet twice daily for 4 days, then increase to one 1 mg tablet twice daily. 53 tablet 0   No current facility-administered medications for this visit.     Past Surgical History:  Procedure Laterality Date  . CHOLECYSTECTOMY       Allergies  Allergen Reactions  . Neomycin Rash      Family History  Problem Relation Age of Onset  . COPD Mother   . Cancer Father   . Breast cancer Maternal Grandmother   . COPD Brother      Social History Ms. Patty reports that she has been smoking cigarettes. She has a 43.00 pack-year smoking history. She has never used smokeless tobacco. Ms. Rennert reports no history of alcohol use.   Review of Systems CONSTITUTIONAL: No weight loss, fever, chills, weakness or fatigue.  HEENT: Eyes: No visual loss, blurred vision, double vision or yellow sclerae.No hearing loss, sneezing,  congestion, runny nose or sore throat.  SKIN: No rash or itching.  CARDIOVASCULAR: per hpi RESPIRATORY: No shortness of breath, cough or sputum.  GASTROINTESTINAL: No anorexia, nausea, vomiting or diarrhea. No abdominal pain or blood.  GENITOURINARY: No burning on urination, no polyuria NEUROLOGICAL: No headache, dizziness, syncope, paralysis, ataxia, numbness or tingling in the extremities. No change in bowel or bladder control.  MUSCULOSKELETAL: No muscle, back pain, joint pain or stiffness.  LYMPHATICS: No enlarged nodes. No history of splenectomy.  PSYCHIATRIC: No history of depression or anxiety.  ENDOCRINOLOGIC: No reports of sweating, cold or heat intolerance. No polyuria or polydipsia.  Marland Kitchen   Physical Examination Vitals:   01/30/20 1022  BP: 110/78  Pulse: (!) 56  SpO2: 95%   Filed Weights   01/30/20 1022  Weight: 133 lb 12.8 oz (60.7 kg)    Gen: resting comfortably, no acute distress HEENT: no scleral icterus, pupils equal round and reactive, no palptable cervical adenopathy,  CV: RRR, no m/r/g, no jvd Resp: Clear to auscultation bilaterally GI: abdomen is soft, non-tender, non-distended, normal bowel sounds, no hepatosplenomegaly MSK: extremities are warm, no edema.  Skin: warm, no rash Neuro:  no focal deficits Psych: appropriate affect   Diagnostic Studies  02/2017 cardiac MRI IMPRESSION: 1) PFO present with only left to right flow seen  2) Moderate RA and mild  LA enlargement  3) Normal RV size and function with no evidence of RV dysplasia  4) Normal LV size and function EF 67%  5) No scar/infiltration on delayed inversion recovery sequences using gadolinium   11/2016 echo LVEF 60-65%, normal diastolic function, severe RAE, PFO, mod TR  Assessment and Plan  1. Syncope - no episodes in over 3 years - continue to monitor at this time. Normal QTc today 457 - if recurrent episodes would plan for cardiac monitor and likely referal back to  EP        Antoine Poche, M.D.

## 2020-01-30 NOTE — Patient Instructions (Signed)

## 2020-02-04 ENCOUNTER — Ambulatory Visit (HOSPITAL_COMMUNITY): Payer: Medicare Other | Admitting: Occupational Therapy

## 2020-02-04 ENCOUNTER — Telehealth (HOSPITAL_COMMUNITY): Payer: Self-pay | Admitting: Occupational Therapy

## 2020-02-04 NOTE — Telephone Encounter (Signed)
pt cancelled appts for 5/12 and 5/13 because she is out of town

## 2020-02-05 ENCOUNTER — Encounter: Payer: Self-pay | Admitting: Orthopaedic Surgery

## 2020-02-05 ENCOUNTER — Ambulatory Visit: Payer: Medicare Other | Admitting: Orthopaedic Surgery

## 2020-02-06 ENCOUNTER — Encounter (HOSPITAL_COMMUNITY): Payer: Medicare Other | Admitting: Occupational Therapy

## 2020-02-09 ENCOUNTER — Encounter (HOSPITAL_COMMUNITY): Payer: Self-pay | Admitting: Occupational Therapy

## 2020-02-09 ENCOUNTER — Ambulatory Visit (HOSPITAL_COMMUNITY): Payer: Medicare Other | Admitting: Occupational Therapy

## 2020-02-09 ENCOUNTER — Other Ambulatory Visit: Payer: Self-pay

## 2020-02-09 DIAGNOSIS — M25512 Pain in left shoulder: Secondary | ICD-10-CM | POA: Diagnosis not present

## 2020-02-09 DIAGNOSIS — R29898 Other symptoms and signs involving the musculoskeletal system: Secondary | ICD-10-CM

## 2020-02-09 DIAGNOSIS — M25612 Stiffness of left shoulder, not elsewhere classified: Secondary | ICD-10-CM

## 2020-02-09 NOTE — Therapy (Signed)
[facility] [address] [place], [place], [address] Phone: [phone]   Fax:  [phone]  Occupational Therapy Treatment  Patient Details  Name: Stephanie Hendrix MRN: [id number] Date of Birth: [birth year] Referring Provider (OT): Dr. Darreld Mclean   Encounter Date: 02/09/2020  OT End of Session - 02/09/20 1425    Visit Number  3    Number of Visits  16    Date for OT Re-Evaluation  03/27/20   02/25/20   Authorization Type  1) Medicare A & B 2) Medicaid    Authorization Time Period  no visit limit    Progress Note Due on Visit  10    OT Start Time  1345    OT Stop Time  1425    OT Time Calculation (min)  40 min    Activity Tolerance  Patient tolerated treatment well    Behavior During Therapy  [facility] for tasks assessed/performed       Past Medical History:  Diagnosis Date  . Allergy   . Syncope   . Tobacco abuse 12/22/2016    Past Surgical History:  Procedure Laterality Date  . CHOLECYSTECTOMY      There were no vitals filed for this visit.  Subjective Assessment - 02/09/20 1344    Subjective   S: I can lift my arm up now.    Currently in Pain?  No/denies         [facility] OT Assessment - 02/09/20 1344      Assessment   Medical Diagnosis  left shoulder adhesive capsulitis      Precautions   Precautions  None               OT Treatments/Exercises (OP) - 02/09/20 1347      Exercises   Exercises  Shoulder      Shoulder Exercises: Supine   Protraction  PROM;5 reps;AAROM;10 reps    Horizontal ABduction  PROM;5 reps;AAROM;10 reps    External Rotation  PROM;5 reps;AAROM;10 reps    Internal Rotation  PROM;5 reps;AAROM;10 reps    Flexion  PROM;5 reps;AAROM;10 reps    ABduction  PROM;5 reps;AAROM;10 reps      Shoulder Exercises: Standing   Protraction  AAROM;10 reps    Horizontal ABduction  AAROM;10 reps    External Rotation  AAROM;10 reps    Internal Rotation  AAROM;10 reps    Flexion  AAROM;10 reps    ABduction  AAROM;10 reps      Shoulder Exercises: ROM/Strengthening   UBE (Upper Arm Bike)  Level 1 2' forward 2' reverse   pace: 4.0-5.0     Shoulder Exercises: Stretch   Cross Chest Stretch  3 reps;10 seconds    Internal Rotation Stretch  3 reps   10" holds, horizontal towel   External Rotation Stretch  3 reps;10 seconds    Wall Stretch - Flexion  3 reps;10 seconds    Wall Stretch - ABduction  3 reps;10 seconds    Other Shoulder Stretches  doorway stretch, 3X, 10" holds      Manual Therapy   Manual Therapy  Myofascial release    Manual therapy comments  completed separately from therapeutic exercises    Myofascial Release  myofascial release to left upper arm, anterior shoulder, trapezius, and scapualr regions to decrease pain and fascial restrictions and increase joint ROM.              OT Education - 02/09/20 1358    Education Details  AA/ROM, shoulder stretches    Person(s) Educated  Patient    Methods  Explanation;Demonstration;Handout    Comprehension  Verbalized understanding;Returned demonstration       OT Short Term Goals - 01/28/20 1537      OT SHORT TERM GOAL #1   Title  Pt will be provided with and educated on HEP to improve mobility required for LUE use during ADL tasks.    Time  4    Period  Weeks    Status  On-going    Target Date  02/26/20      OT SHORT TERM GOAL #2   Title  Pt will increase LUE P/ROM to [facility] to improve ability to use LUE as assist during dressing tasks.    Time  4    Period  Weeks    Status  On-going      OT SHORT TERM GOAL #3   Title  Pt will decrease LUE pain to 5/10 to improve ability to use LUE as assist when driving.    Time  4    Period  Weeks    Status  On-going      OT SHORT TERM GOAL #4   Title  Pt will increase LUE strength to 3+/5 or greater to improve ability to complete meal preparation tasks.    Time  4    Period  Weeks    Status  On-going        OT Long Term Goals - 01/28/20 1537      OT LONG TERM  GOAL #1   Title  Pt will increase LUE A/ROM to [facility] to improve ability to reaching behind head and behind back during bathing tasks.    Time  8    Period  Weeks    Status  On-going      OT LONG TERM GOAL #2   Title  Pt will decrease LUE fascial restrictions to min amounts or less to improve mobility required for functional reaching tasks.    Time  8    Period  Weeks    Status  On-going      OT LONG TERM GOAL #3   Title  Pt will decrease pain in LUE to 2/10 to improve ability to sleep for 4 consecutive hours or greater at night.    Time  8    Period  Weeks    Status  On-going      OT LONG TERM GOAL #4   Title  Pt will increase LUE strength to 4+/5 or greater to improve ability to lift and hold grandchildren.    Time  8    Period  Weeks    Status  On-going            Plan - 02/09/20 1358    Clinical Impression Statement  A: Pt reports she is able to lift her arm now and is only having pain with overhead reaching. Continued with manual therapy to address fascial restrictions in trapezius and around [facility] joint today. Continued with AA/ROM and added in standing, completed shoulder stretches as well. Added UBE today. Verbal cuing for form and technique.    Body Structure / Function / Physical Skills  ADL;Endurance;UE functional use;Fascial restriction;Pain;ROM;IADL;Strength    Plan  P: Follow up on HEP, add functional reaching task    OT Home Exercise Plan  eval: table slides, scapular A/ROM; 5/17: AA/ROM, shoulder stretches    Consulted and Agree with Plan of Care  Patient  Patient will benefit from skilled therapeutic intervention in order to improve the following deficits and impairments:   Body Structure / Function / Physical Skills: ADL, Endurance, UE functional use, Fascial restriction, Pain, ROM, IADL, Strength       Visit Diagnosis: Acute pain of left shoulder  Stiffness of left shoulder, not elsewhere classified  Other symptoms and signs involving the  musculoskeletal system    Problem List Patient Active Problem List   Diagnosis Date Noted  . Encounter for gynecological examination with Papanicolaou smear of cervix 01/23/2020  . Screening for colorectal cancer 01/23/2020  . Smoker 01/23/2020  . Encounter for smoking cessation counseling 01/23/2020  . Acute pain of left shoulder 01/14/2020  . Post-menopausal 01/14/2020  . Encounter for screening for malignant neoplasm of cervix 01/14/2020  . Encounter for mammogram to establish baseline mammogram 01/14/2020  . Impaired fasting glucose 01/14/2020  . Nicotine addiction 01/14/2020  . Encounter for lipid screening for cardiovascular disease 01/14/2020  . QT prolongation 01/14/2020  . Encounter for screening for malignant neoplasm of colon 01/14/2020   Guadelupe Sabin, OTR/L  [phone] 02/09/2020, 2:26 PM  [place] [address] [place], [place], [address] Phone: [phone]   Fax:  [phone]  Name: Stephanie Hendrix MRN: [id number] Date of Birth: [birth year]

## 2020-02-09 NOTE — Patient Instructions (Signed)
Perform each exercise ____10-15____ reps. 2-3x days.   1) Protraction   Start by holding a wand or cane at chest height.  Next, slowly push the wand outwards in front of your body so that your elbows become fully straightened. Then, return to the original position.     2) Shoulder FLEXION   In the standing position, hold a wand/cane with both arms, palms down on both sides. Raise up the wand/cane allowing your unaffected arm to perform most of the effort. Your affected arm should be partially relaxed.      3) Internal/External ROTATION   In the standing position, hold a wand/cane with both hands keeping your elbows bent. Move your arms and wand/cane to one side.  Your affected arm should be partially relaxed while your unaffected arm performs most of the effort.       4) Shoulder ABDUCTION   While holding a wand/cane palm face up on the injured side and palm face down on the uninjured side, slowly raise up your injured arm to the side.        5) Horizontal Abduction/Adduction      Straight arms holding cane at shoulder height, bring cane to right, center, left. Repeat starting to left.   Copyright  V[employer]. All rights reserved.       1) Flexion Wall Stretch    Face wall, place affected handon wall in front of you. Slide hand up the wall  and lean body in towards the wall. Hold for 10 seconds. Repeat 3-5 times. 1-2 times/day.     2) Towel Stretch with Internal Rotation   Or     Gently pull up (or to the side) your affected arm  behind your back with the assist of a towel. Hold 10 seconds, repeat 3-5 times. 1-2 times/day.             3) Corner Stretch    Stand at a corner of a wall, place your arms on the walls with elbows bent. Lean into the corner until a stretch is felt along the front of your chest and/or shoulders. Hold for 10 seconds. Repeat 3-5X, 1-2 times/day.    4) Posterior Capsule Stretch    Bring the involved arm across chest.  Grasp elbow and pull toward chest until you feel a stretch in the back of the upper arm and shoulder. Hold 10 seconds. Repeat 3-5X. Complete 1-2 times/day.    5) External Rotation Stretch:     Place your affected hand on the wall with the elbow bent and gently turn your body the opposite direction until a stretch is felt. Hold 10 seconds, repeat 3-5X. Complete 1-2 times/day.

## 2020-02-12 ENCOUNTER — Ambulatory Visit (HOSPITAL_COMMUNITY): Payer: Medicare Other | Admitting: Specialist

## 2020-02-16 ENCOUNTER — Telehealth (HOSPITAL_COMMUNITY): Payer: Self-pay | Admitting: Occupational Therapy

## 2020-02-16 ENCOUNTER — Ambulatory Visit (HOSPITAL_COMMUNITY): Payer: Medicare Other | Admitting: Occupational Therapy

## 2020-02-16 NOTE — Telephone Encounter (Signed)
pt cancelled appt because she said she was broke and did not have money for a taxi

## 2020-02-20 ENCOUNTER — Telehealth (HOSPITAL_COMMUNITY): Payer: Self-pay | Admitting: Occupational Therapy

## 2020-02-20 ENCOUNTER — Ambulatory Visit (HOSPITAL_COMMUNITY): Payer: Medicare Other | Admitting: Occupational Therapy

## 2020-02-20 NOTE — Telephone Encounter (Signed)
Attempted to call pt regarding no show for appt today. Pt's voicemail has not been set up and OT unable to leave a message. If pt is a no show on Tuesday she will only be able to make one appt at a time, per attendance policy.   Ezra Sites, OTR/L  [phone] 02/20/20

## 2020-02-24 ENCOUNTER — Ambulatory Visit (HOSPITAL_COMMUNITY): Payer: Medicaid Other | Admitting: Occupational Therapy

## 2020-02-24 ENCOUNTER — Telehealth (HOSPITAL_COMMUNITY): Payer: Self-pay | Admitting: Occupational Therapy

## 2020-02-24 ENCOUNTER — Encounter (HOSPITAL_COMMUNITY): Payer: Self-pay | Admitting: Occupational Therapy

## 2020-02-24 NOTE — Telephone Encounter (Signed)
Attempted to call pt re: no-show. No answer and pt has voicemail that has not been set-up therefore unable to leave message. Pt will only be able to schedule 1 appt at a time, remaining appts have been canceled with exception of 6/4 appt. If pt is no-show for 6/4 appt she will be discharge per attendance policy.   Ezra Sites, OTR/L  [phone] 02/24/2020

## 2020-02-27 ENCOUNTER — Encounter (HOSPITAL_COMMUNITY): Payer: Medicare Other | Admitting: Specialist

## 2020-02-27 ENCOUNTER — Encounter (HOSPITAL_COMMUNITY): Payer: Self-pay | Admitting: Specialist

## 2020-02-28 ENCOUNTER — Encounter (HOSPITAL_COMMUNITY): Payer: Self-pay | Admitting: Specialist

## 2020-02-28 NOTE — Therapy (Signed)
[place], [place], [address] Phone: [phone]   Fax:  [phone]  February 28, 2020    No Recipients  Occupational Therapy Discharge Summary   Patient: Stephanie Hendrix MRN: [id number] Date of Birth: 12-07-1955  Diagnosis: No diagnosis found.  Referring Provider (OT): Dr. Sanjuana Kava   OCCUPATIONAL THERAPY DISCHARGE SUMMARY  Visits from Start of Care: 3  Current functional level related to goals / functional outcomes: unknown  Remaining deficits: unknown   Education / Equipment: HEP to improve shoulder range. Plan: Patient agrees to discharge.  Patient goals were not met. Patient is being discharged due to not returning since the last visit.  ?????   Patient has had 3 consecutive no shows with last visit being 02/09/20.    Vangie Bicker, [place], OTR/L [phone]

## 2020-03-02 ENCOUNTER — Encounter (HOSPITAL_COMMUNITY): Payer: Medicare Other | Admitting: Specialist

## 2020-03-04 ENCOUNTER — Encounter (HOSPITAL_COMMUNITY): Payer: Medicare Other | Admitting: Occupational Therapy

## 2020-03-09 ENCOUNTER — Encounter (HOSPITAL_COMMUNITY): Payer: Medicare Other | Admitting: Specialist

## 2020-03-11 ENCOUNTER — Encounter (HOSPITAL_COMMUNITY): Payer: Medicare Other | Admitting: Occupational Therapy

## 2020-03-16 ENCOUNTER — Encounter (HOSPITAL_COMMUNITY): Payer: Medicare Other | Admitting: Specialist

## 2020-03-18 ENCOUNTER — Encounter (HOSPITAL_COMMUNITY): Payer: Medicare Other | Admitting: Occupational Therapy

## 2020-03-22 ENCOUNTER — Encounter (HOSPITAL_COMMUNITY): Payer: Medicare Other | Admitting: Occupational Therapy

## 2020-03-24 ENCOUNTER — Encounter (HOSPITAL_COMMUNITY): Payer: Medicare Other | Admitting: Occupational Therapy

## 2020-05-11 DIAGNOSIS — Z720 Tobacco use: Secondary | ICD-10-CM | POA: Diagnosis not present

## 2020-05-11 DIAGNOSIS — I951 Orthostatic hypotension: Secondary | ICD-10-CM | POA: Diagnosis not present

## 2020-05-11 DIAGNOSIS — Z881 Allergy status to other antibiotic agents status: Secondary | ICD-10-CM | POA: Diagnosis not present

## 2020-05-11 DIAGNOSIS — R030 Elevated blood-pressure reading, without diagnosis of hypertension: Secondary | ICD-10-CM | POA: Diagnosis not present

## 2020-05-14 ENCOUNTER — Ambulatory Visit: Payer: Medicare Other | Admitting: Family Medicine

## 2020-05-14 ENCOUNTER — Other Ambulatory Visit: Payer: Self-pay

## 2020-07-09 ENCOUNTER — Ambulatory Visit: Payer: Medicare HMO

## 2020-10-12 ENCOUNTER — Ambulatory Visit (INDEPENDENT_AMBULATORY_CARE_PROVIDER_SITE_OTHER): Payer: Medicare HMO | Admitting: Nurse Practitioner

## 2020-10-12 ENCOUNTER — Other Ambulatory Visit: Payer: Self-pay

## 2020-10-12 ENCOUNTER — Encounter: Payer: Self-pay | Admitting: Nurse Practitioner

## 2020-10-12 DIAGNOSIS — Z0000 Encounter for general adult medical examination without abnormal findings: Secondary | ICD-10-CM

## 2020-10-12 DIAGNOSIS — Z1231 Encounter for screening mammogram for malignant neoplasm of breast: Secondary | ICD-10-CM

## 2020-10-12 DIAGNOSIS — Z1211 Encounter for screening for malignant neoplasm of colon: Secondary | ICD-10-CM

## 2020-10-12 NOTE — Addendum Note (Signed)
Addended by: Dellia Cloud on: 10/12/2020 09:03 AM   Modules accepted: Orders

## 2020-10-12 NOTE — Patient Instructions (Signed)
Stephanie Hendrix , Thank you for taking time to come for your Medicare Wellness Visit. I appreciate your ongoing commitment to your health goals. Please review the following plan we discussed and let me know if I can assist you in the future.   Screening recommendations/referrals: Colonoscopy: Due, ordered Cologuard.  Mammogram: Due; ordered today.  Bone Density: Due; discuss at next office visit   Recommended yearly ophthalmology/optometry visit for glaucoma screening and checkup Recommended yearly dental visit for hygiene and checkup  Vaccinations: Influenza vaccine: Due; advised to receive at next office visit.  Pneumococcal vaccine: Due Tdap vaccine: Complete; Due 01/13/21  Shingles vaccine: Eligible; information provided.     Advanced directives: N/A   Conditions/risks identified: Pt to receive updated health maintenance care gaps that she is eligible for.   Next appointment: 10/21/20 @ 10:40am with Laury Axon, NP    Preventive Care 65 Years and Older, Female Preventive care refers to lifestyle choices and visits with your health care provider that can promote health and wellness. What does preventive care include?  A yearly physical exam. This is also called an annual well check.  Dental exams once or twice a year.  Routine eye exams. Ask your health care provider how often you should have your eyes checked.  Personal lifestyle choices, including:  Daily care of your teeth and gums.  Regular physical activity.  Eating a healthy diet.  Avoiding tobacco and drug use.  Limiting alcohol use.  Practicing safe sex.  Taking low-dose aspirin every day.  Taking vitamin and mineral supplements as recommended by your health care provider. What happens during an annual well check? The services and screenings done by your health care provider during your annual well check will depend on your age, overall health, lifestyle risk factors, and family history of disease. Counseling   Your health care provider may ask you questions about your:  Alcohol use.  Tobacco use.  Drug use.  Emotional well-being.  Home and relationship well-being.  Sexual activity.  Eating habits.  History of falls.  Memory and ability to understand (cognition).  Work and work Astronomer.  Reproductive health. Screening  You may have the following tests or measurements:  Height, weight, and BMI.  Blood pressure.  Lipid and cholesterol levels. These may be checked every 5 years, or more frequently if you are over 54 years old.  Skin check.  Lung cancer screening. You may have this screening every year starting at age 46 if you have a 30-pack-year history of smoking and currently smoke or have quit within the past 15 years.  Fecal occult blood test (FOBT) of the stool. You may have this test every year starting at age 65.  Flexible sigmoidoscopy or colonoscopy. You may have a sigmoidoscopy every 5 years or a colonoscopy every 10 years starting at age 50.  Hepatitis C blood test.  Hepatitis B blood test.  Sexually transmitted disease (STD) testing.  Diabetes screening. This is done by checking your blood sugar (glucose) after you have not eaten for a while (fasting). You may have this done every 1-3 years.  Bone density scan. This is done to screen for osteoporosis. You may have this done starting at age 59.  Mammogram. This may be done every 1-2 years. Talk to your health care provider about how often you should have regular mammograms. Talk with your health care provider about your test results, treatment options, and if necessary, the need for more tests. Vaccines  Your health care provider may  recommend certain vaccines, such as:  Influenza vaccine. This is recommended every year.  Tetanus, diphtheria, and acellular pertussis (Tdap, Td) vaccine. You may need a Td booster every 10 years.  Zoster vaccine. You may need this after age 13.  Pneumococcal 13-valent  conjugate (PCV13) vaccine. One dose is recommended after age 47.  Pneumococcal polysaccharide (PPSV23) vaccine. One dose is recommended after age 22. Talk to your health care provider about which screenings and vaccines you need and how often you need them. This information is not intended to replace advice given to you by your health care provider. Make sure you discuss any questions you have with your health care provider. Document Released: 10/08/2015 Document Revised: 05/31/2016 Document Reviewed: 07/13/2015 Elsevier Interactive Patient Education  2017 [place] Prevention in the Home Falls can cause injuries. They can happen to people of all ages. There are many things you can do to make your home safe and to help prevent falls. What can I do on the outside of my home?  Regularly fix the edges of walkways and driveways and fix any cracks.  Remove anything that might make you trip as you walk through a door, such as a raised step or threshold.  Trim any bushes or trees on the path to your home.  Use bright outdoor lighting.  Clear any walking paths of anything that might make someone trip, such as rocks or tools.  Regularly check to see if handrails are loose or broken. Make sure that both sides of any steps have handrails.  Any raised decks and porches should have guardrails on the edges.  Have any leaves, snow, or ice cleared regularly.  Use sand or salt on walking paths during winter.  Clean up any spills in your garage right away. This includes oil or grease spills. What can I do in the bathroom?  Use night lights.  Install grab bars by the toilet and in the tub and shower. Do not use towel bars as grab bars.  Use non-skid mats or decals in the tub or shower.  If you need to sit down in the shower, use a plastic, non-slip stool.  Keep the floor dry. Clean up any water that spills on the floor as soon as it happens.  Remove soap buildup in the tub or  shower regularly.  Attach bath mats securely with double-sided non-slip rug tape.  Do not have throw rugs and other things on the floor that can make you trip. What can I do in the bedroom?  Use night lights.  Make sure that you have a light by your bed that is easy to reach.  Do not use any sheets or blankets that are too big for your bed. They should not hang down onto the floor.  Have a firm chair that has side arms. You can use this for support while you get dressed.  Do not have throw rugs and other things on the floor that can make you trip. What can I do in the kitchen?  Clean up any spills right away.  Avoid walking on wet floors.  Keep items that you use a lot in easy-to-reach places.  If you need to reach something above you, use a strong step stool that has a grab bar.  Keep electrical cords out of the way.  Do not use floor polish or wax that makes floors slippery. If you must use wax, use non-skid floor wax.  Do not have throw rugs and  other things on the floor that can make you trip. What can I do with my stairs?  Do not leave any items on the stairs.  Make sure that there are handrails on both sides of the stairs and use them. Fix handrails that are broken or loose. Make sure that handrails are as long as the stairways.  Check any carpeting to make sure that it is firmly attached to the stairs. Fix any carpet that is loose or worn.  Avoid having throw rugs at the top or bottom of the stairs. If you do have throw rugs, attach them to the floor with carpet tape.  Make sure that you have a light switch at the top of the stairs and the bottom of the stairs. If you do not have them, ask someone to add them for you. What else can I do to help prevent falls?  Wear shoes that:  Do not have high heels.  Have rubber bottoms.  Are comfortable and fit you well.  Are closed at the toe. Do not wear sandals.  If you use a stepladder:  Make sure that it is fully  opened. Do not climb a closed stepladder.  Make sure that both sides of the stepladder are locked into place.  Ask someone to hold it for you, if possible.  Clearly mark and make sure that you can see:  Any grab bars or handrails.  First and last steps.  Where the edge of each step is.  Use tools that help you move around (mobility aids) if they are needed. These include:  Canes.  Walkers.  Scooters.  Crutches.  Turn on the lights when you go into a dark area. Replace any light bulbs as soon as they burn out.  Set up your furniture so you have a clear path. Avoid moving your furniture around.  If any of your floors are uneven, fix them.  If there are any pets around you, be aware of where they are.  Review your medicines with your doctor. Some medicines can make you feel dizzy. This can increase your chance of falling. Ask your doctor what other things that you can do to help prevent falls. This information is not intended to replace advice given to you by your health care provider. Make sure you discuss any questions you have with your health care provider. Document Released: 07/08/2009 Document Revised: 02/17/2016 Document Reviewed: 10/16/2014 Elsevier Interactive Patient Education  2017 [employer].

## 2020-10-12 NOTE — Progress Notes (Signed)
Subjective:   Stephanie Hendrix is a 65 y.o. female who presents for Medicare Annual (Subsequent) preventive examination.        Objective:    There were no vitals filed for this visit. There is no height or weight on file to calculate BMI.  Advanced Directives 01/27/2020 08/24/2018 12/20/2016 05/22/2016  Does Patient Have a Medical Advance Directive? No No No No  Would patient like information on creating a medical advance directive? No - Patient declined No - Patient declined - -    Current Medications (verified) Outpatient Encounter Medications as of 10/12/2020  Medication Sig  . naproxen (NAPROSYN) 500 MG tablet Take 1 tablet (500 mg total) by mouth 2 (two) times daily with a meal.  . varenicline (CHANTIX STARTING MONTH PAK) 0.5 MG X 11 & 1 MG X 42 tablet Take one 0.5 mg tablet by mouth once daily for 3 days, then increase to one 0.5 mg tablet twice daily for 4 days, then increase to one 1 mg tablet twice daily.   No facility-administered encounter medications on file as of 10/12/2020.    Allergies (verified) Neomycin   History: Past Medical History:  Diagnosis Date  . Allergy   . Syncope   . Tobacco abuse 12/22/2016   Past Surgical History:  Procedure Laterality Date  . CHOLECYSTECTOMY     Family History  Problem Relation Age of Onset  . COPD Mother   . Cancer Father   . Breast cancer Maternal Grandmother   . COPD Brother    Social History   Socioeconomic History  . Marital status: Divorced    Spouse name: Not on file  . Number of children: Not on file  . Years of education: Not on file  . Highest education level: Not on file  Occupational History  . Not on file  Tobacco Use  . Smoking status: Current Every Day Smoker    Packs/day: 1.00    Years: 43.00    Pack years: 43.00    Types: Cigarettes  . Smokeless tobacco: Never Used  Vaping Use  . Vaping Use: Never used  Substance and Sexual Activity  . Alcohol use: No  . Drug use: No  . Sexual activity:  Not Currently    Birth control/protection: Post-menopausal  Other Topics Concern  . Not on file  Social History Narrative  . Not on file   Social Determinants of Health   Financial Resource Strain: Low Risk   . Difficulty of Paying Living Expenses: Not hard at all  Food Insecurity: No Food Insecurity  . Worried About Programme researcher, broadcasting/film/video in the Last Year: Never true  . Ran Out of Food in the Last Year: Never true  Transportation Needs: No Transportation Needs  . Lack of Transportation (Medical): No  . Lack of Transportation (Non-Medical): No  Physical Activity: Inactive  . Days of Exercise per Week: 1 day  . Minutes of Exercise per Session: 0 min  Stress: No Stress Concern Present  . Feeling of Stress : Not at all  Social Connections: Moderately Isolated  . Frequency of Communication with Friends and Family: More than three times a week  . Frequency of Social Gatherings with Friends and Family: More than three times a week  . Attends Religious Services: 1 to 4 times per year  . Active Member of Clubs or Organizations: No  . Attends Banker Meetings: Never  . Marital Status: Separated    Tobacco Counseling Ready to quit:  Not Answered Counseling given: Not Answered   Clinical Intake:                 Diabetic? No          Activities of Daily Living In your present state of health, do you have any difficulty performing the following activities: 01/14/2020  Hearing? N  Vision? N  Difficulty concentrating or making decisions? N  Walking or climbing stairs? N  Dressing or bathing? N  Doing errands, shopping? N  Some recent data might be hidden    Patient Care Team: Freddy Finner, NP as PCP - General (Family Medicine) Wyline Mood Dorothe Pea, MD as PCP - Cardiology (Cardiology)  Indicate any recent Medical Services you may have received from other than [facility] providers in the past year (date may be approximate).     Assessment:   This is a  routine wellness examination for [facility].  Hearing/Vision screen No exam data present  Dietary issues and exercise activities discussed:    Goals    . Quit Smoking      Depression Screen PHQ 2/9 Scores 01/23/2020 01/14/2020  PHQ - 2 Score 0 0  PHQ- 9 Score 0 -    Fall Risk Fall Risk  01/23/2020 01/14/2020  Falls in the past year? 0 0  Number falls in past yr: 0 0  Injury with Fall? 0 0  Follow up Falls evaluation completed -    FALL RISK PREVENTION PERTAINING TO THE HOME:  Any stairs in or around the home? Yes  If so, are there any without handrails? Yes  Home free of loose throw rugs in walkways, pet beds, electrical cords, etc? Yes  Adequate lighting in your home to reduce risk of falls? Yes   ASSISTIVE DEVICES UTILIZED TO PREVENT FALLS:  Life alert? No Use of a cane, walker or w/c? No  Grab bars in the bathroom? No  Shower chair or bench in shower? No  Elevated toilet seat or a handicapped toilet? No   TIMED UP AND GO:  Was the test performed? No .    Cognitive Function:        Immunizations Immunization History  Administered Date(s) Administered  . Janssen (J&J) SARS-COV-2 Vaccination 12/07/2019    TDAP status: Up to date  Flu Vaccine status: Due, Education has been provided regarding the importance of this vaccine. Advised may receive this vaccine at local pharmacy or Health Dept. Aware to provide a copy of the vaccination record if obtained from local pharmacy or Health Dept. Verbalized acceptance and understanding.  Pneumococcal vaccine status: Due, Education has been provided regarding the importance of this vaccine. Advised may receive this vaccine at local pharmacy or Health Dept. Aware to provide a copy of the vaccination record if obtained from local pharmacy or Health Dept. Verbalized acceptance and understanding.  Covid-19 vaccine status: Completed vaccines  Qualifies for Shingles Vaccine? Yes   Zostavax completed No   Shingrix Completed?:  No.    Education has been provided regarding the importance of this vaccine. Patient has been advised to call insurance company to determine out of pocket expense if they have not yet received this vaccine. Advised may also receive vaccine at local pharmacy or Health Dept. Verbalized acceptance and understanding.  Screening Tests Health Maintenance  Topic Date Due  . Hepatitis C Screening  Never done  . COLONOSCOPY (Pts 45-39yrs Insurance coverage will need to be confirmed)  Never done  . MAMMOGRAM  Never done  . DEXA SCAN  Never done  . PNA vac Low Risk Adult (1 of 2 - PCV13) Never done  . COVID-19 Vaccine (2 - Booster for [employer] series) 02/01/2020  . INFLUENZA VACCINE  Never done  . TETANUS/TDAP  01/13/2021 (Originally 04/04/1975)    Health Maintenance  Health Maintenance Due  Topic Date Due  . Hepatitis C Screening  Never done  . COLONOSCOPY (Pts 45-15yrs Insurance coverage will need to be confirmed)  Never done  . MAMMOGRAM  Never done  . DEXA SCAN  Never done  . PNA vac Low Risk Adult (1 of 2 - PCV13) Never done  . COVID-19 Vaccine (2 - Booster for [employer] series) 02/01/2020  . INFLUENZA VACCINE  Never done    Colorectal cancer screening: Type of screening: Cologuard. Completed ordered. Repeat every 2 years  Mammogram status: Ordered today. Pt provided with contact info and advised to call to schedule appt.   Bone Density Screening: Never done, declines at this time.   Lung Cancer Screening: (Low Dose CT Chest recommended if Age 3-80 years, 30 pack-year currently smoking OR have quit w/in 15years.) does qualify.   Lung Cancer Screening Referral: wants to discuss at next OV.   Additional Screening:  Hepatitis C Screening: does qualify; has not been done.    Vision Screening: Recommended annual ophthalmology exams for early detection of glaucoma and other disorders of the eye. Is the patient up to date with their annual eye exam?  Yes    Who is the provider or what is  the name of the office in which the patient attends annual eye exams? Up to date, Dr. Charise Killian  If pt is not established with a provider, would they like to be referred to a provider to establish care? No .   Dental Screening: Recommended annual dental exams for proper oral hygiene  Community Resource Referral / Chronic Care Management: CRR required this visit?  No   CCM required this visit?  No      Plan:     I have personally reviewed and noted the following in the patient's chart:   . Medical and social history . Use of alcohol, tobacco or illicit drugs  . Current medications and supplements . Functional ability and status . Nutritional status . Physical activity . Advanced directives . List of other physicians . Hospitalizations, surgeries, and ER visits in previous 12 months . Vitals . Screenings to include cognitive, depression, and falls . Referrals and appointments  In addition, I have reviewed and discussed with patient certain preventive protocols, quality metrics, and best practice recommendations. A written personalized care plan for preventive services as well as general preventive health recommendations were provided to patient.     Dellia Cloud, LPN   [id number]/[id number]   Nurse Notes: AWV conducted over the phone with pt consent to televisit via audio. Pt was in the home at the time, and provider off site. This call took approx 20 min to complete.

## 2020-10-21 ENCOUNTER — Telehealth: Payer: Medicare HMO | Admitting: Nurse Practitioner

## 2020-10-21 ENCOUNTER — Other Ambulatory Visit: Payer: Self-pay

## 2020-10-29 ENCOUNTER — Ambulatory Visit: Payer: Medicare HMO | Admitting: Nurse Practitioner

## 2020-11-13 DIAGNOSIS — H524 Presbyopia: Secondary | ICD-10-CM | POA: Diagnosis not present

## 2020-12-04 ENCOUNTER — Other Ambulatory Visit: Payer: Self-pay

## 2020-12-04 ENCOUNTER — Encounter (HOSPITAL_COMMUNITY): Payer: Self-pay | Admitting: Emergency Medicine

## 2020-12-04 DIAGNOSIS — H5712 Ocular pain, left eye: Secondary | ICD-10-CM | POA: Insufficient documentation

## 2020-12-04 DIAGNOSIS — Z5321 Procedure and treatment not carried out due to patient leaving prior to being seen by health care provider: Secondary | ICD-10-CM | POA: Diagnosis not present

## 2020-12-04 NOTE — ED Triage Notes (Signed)
Pt presents to the ED with left eye pain swelling and redness since this morning.   Pt states it feels like there is something in the left eye.  Pt normally wears glasses-no contacts.

## 2020-12-05 ENCOUNTER — Emergency Department (HOSPITAL_COMMUNITY)
Admission: EM | Admit: 2020-12-05 | Discharge: 2020-12-05 | Disposition: A | Discharge status: Home / Self Care | Payer: Medicare HMO | Attending: Emergency Medicine | Admitting: Emergency Medicine

## 2020-12-24 ENCOUNTER — Ambulatory Visit (INDEPENDENT_AMBULATORY_CARE_PROVIDER_SITE_OTHER): Payer: Medicare HMO | Admitting: Nurse Practitioner

## 2020-12-24 ENCOUNTER — Other Ambulatory Visit: Payer: Self-pay

## 2020-12-24 ENCOUNTER — Emergency Department (HOSPITAL_COMMUNITY)
Admission: EM | Admit: 2020-12-24 | Discharge: 2020-12-24 | Disposition: A | Discharge status: Home / Self Care | Payer: Medicare HMO | Attending: Emergency Medicine | Admitting: Emergency Medicine

## 2020-12-24 ENCOUNTER — Encounter: Payer: Self-pay | Admitting: Nurse Practitioner

## 2020-12-24 ENCOUNTER — Encounter (HOSPITAL_COMMUNITY): Payer: Self-pay | Admitting: *Deleted

## 2020-12-24 VITALS — BP 128/89 | HR 74 | Temp 98.0°F | Resp 20 | Ht 66.5 in | Wt 130.0 lb

## 2020-12-24 DIAGNOSIS — Z0000 Encounter for general adult medical examination without abnormal findings: Secondary | ICD-10-CM

## 2020-12-24 DIAGNOSIS — F17210 Nicotine dependence, cigarettes, uncomplicated: Secondary | ICD-10-CM | POA: Insufficient documentation

## 2020-12-24 DIAGNOSIS — H5712 Ocular pain, left eye: Secondary | ICD-10-CM

## 2020-12-24 DIAGNOSIS — R718 Other abnormality of red blood cells: Secondary | ICD-10-CM | POA: Diagnosis not present

## 2020-12-24 DIAGNOSIS — Z139 Encounter for screening, unspecified: Secondary | ICD-10-CM | POA: Diagnosis not present

## 2020-12-24 DIAGNOSIS — R69 Illness, unspecified: Secondary | ICD-10-CM | POA: Diagnosis not present

## 2020-12-24 MED ORDER — TETRACAINE HCL 0.5 % OP SOLN
2.0000 [drp] | Freq: Once | OPHTHALMIC | Status: AC
  Administered 2020-12-24: 2 [drp] via OPHTHALMIC
  Filled 2020-12-24: qty 4

## 2020-12-24 MED ORDER — FLUORESCEIN SODIUM 1 MG OP STRP
1.0000 | ORAL_STRIP | Freq: Once | OPHTHALMIC | Status: AC
  Administered 2020-12-24: 1 via OPHTHALMIC

## 2020-12-24 MED ORDER — OFLOXACIN 0.3 % OP SOLN: 1.0000 [drp] | Freq: Four times a day (QID) | OPHTHALMIC | 0 refills | Status: AC

## 2020-12-24 MED ORDER — BACITRACIN-POLYMYXIN B 500-10000 UNIT/GM OP OINT: 1.0000 "application " | TOPICAL_OINTMENT | Freq: Three times a day (TID) | OPHTHALMIC | 0 refills | Status: DC

## 2020-12-24 NOTE — ED Triage Notes (Signed)
Left eye swollen and red for 3 weeks

## 2020-12-24 NOTE — ED Provider Notes (Signed)
[facility] EMERGENCY DEPARTMENT Provider Note   CSN: [id number] Arrival date & time: 12/24/20  1611     History Chief Complaint  Patient presents with  . Eye Problem    Stephanie Hendrix is a 65 y.o. female.  HPI   Patient with no significant medical history presents to the emergency department with chief complaint of left eye irritation.  She endorses that she got some trash in her eye approxi-1 month ago, she is unsure if it was grit or something, she knows it was not any liquids.  Since that time she has been having worsening eye irritation, she describes a gritty sensation in her in her left eye worsening with opening, closing her eyelids, denies decreased visual acuity but does admit to photophobia, and clear liquid drainage.  She denies any crusting around the eye.  She denies  alleviating factors.  Patient is not immunocompromise, is a nondiabetic, does not wear contact lenses.  Patient denies headaches, fevers, chills, shortness of breath, chest pain, abdominal pain, no nausea, vomiting diarrhea, worsening pedal edema.  Past Medical History:  Diagnosis Date  . Allergy   . Syncope   . Tobacco abuse 12/22/2016    Patient Active Problem List   Diagnosis Date Noted  . Acute left eye pain 12/24/2020  . Encounter for gynecological examination with Papanicolaou smear of cervix 01/23/2020  . Screening for colorectal cancer 01/23/2020  . Smoker 01/23/2020  . Encounter for smoking cessation counseling 01/23/2020  . Acute pain of left shoulder 01/14/2020  . Post-menopausal 01/14/2020  . Encounter for screening for malignant neoplasm of cervix 01/14/2020  . Encounter for mammogram to establish baseline mammogram 01/14/2020  . Impaired fasting glucose 01/14/2020  . Nicotine addiction 01/14/2020  . Encounter for lipid screening for cardiovascular disease 01/14/2020  . QT prolongation 01/14/2020  . Encounter for screening for malignant neoplasm of colon 01/14/2020    Past Surgical  History:  Procedure Laterality Date  . CHOLECYSTECTOMY       OB History    Gravida  7   Para  6   Term  6   Preterm      AB  1   Living  6     SAB  1   IAB      Ectopic      Multiple      Live Births              Family History  Problem Relation Age of Onset  . COPD Mother   . Cancer Father   . Breast cancer Maternal Grandmother   . COPD Brother     Social History   Tobacco Use  . Smoking status: Current Every Day Smoker    Packs/day: 1.00    Years: 43.00    Pack years: 43.00    Types: Cigarettes  . Smokeless tobacco: Never Used  Vaping Use  . Vaping Use: Never used  Substance Use Topics  . Alcohol use: No  . Drug use: No    Home Medications Prior to Admission medications   Medication Sig Start Date End Date Taking? Authorizing Provider  ofloxacin (OCUFLOX) 0.3 % ophthalmic solution Place 1 drop into the left eye 4 (four) times daily for 5 days. 12/24/20 12/29/20 Yes Carroll Sage, PA-C  bacitracin-polymyxin b (POLYSPORIN) ophthalmic ointment Place 1 application into the left eye 3 (three) times daily. apply to eye every 12 hours while awake 12/24/20   Heather Roberts, NP  naproxen (NAPROSYN) 500  MG tablet Take 1 tablet (500 mg total) by mouth 2 (two) times daily with a meal. 01/15/20   Darreld Mclean, MD  varenicline (CHANTIX STARTING MONTH PAK) 0.5 MG X 11 & 1 MG X 42 tablet Take one 0.5 mg tablet by mouth once daily for 3 days, then increase to one 0.5 mg tablet twice daily for 4 days, then increase to one 1 mg tablet twice daily. Patient not taking: Reported on 12/24/2020 01/23/20   Adline Potter, NP    Allergies    Neomycin  Review of Systems   Review of Systems  Constitutional: Negative for chills and fever.  HENT: Negative for congestion, sore throat and tinnitus.   Eyes: Positive for photophobia, pain, discharge, redness and itching. Negative for visual disturbance.  Respiratory: Negative for shortness of breath.   Cardiovascular:  Negative for chest pain.  Gastrointestinal: Negative for abdominal pain.  Genitourinary: Negative for enuresis.  Musculoskeletal: Negative for back pain.  Skin: Negative for rash.  Neurological: Negative for dizziness and headaches.  Hematological: Does not bruise/bleed easily.    Physical Exam Updated Vital Signs BP 114/76 (BP Location: Right Arm)   Pulse 60   Temp 97.7 F (36.5 C)   Resp 16   Ht 5' 6.5" (1.689 m)   Wt 59 kg   SpO2 99%   BMI 20.67 kg/m   Physical Exam Vitals and nursing note reviewed.  Constitutional:      General: She is not in acute distress.    Appearance: Normal appearance. She is not ill-appearing or diaphoretic.  HENT:     Head: Normocephalic and atraumatic.     Nose: No congestion or rhinorrhea.  Eyes:     General:        Right eye: No discharge.        Left eye: Discharge present.    Extraocular Movements: Extraocular movements intact.     Pupils: Pupils are equal, round, and reactive to light.     Comments: Patient's left eye was visualized she has noted sclera injection, clear discharge coming from the eye.  Eyes were PERRLA EOMs intact, no noted abrasions, no blood noted in the anterior chamber, no purulent discharge present  Cardiovascular:     Rate and Rhythm: Normal rate and regular rhythm.  Pulmonary:     Effort: Pulmonary effort is normal.  Musculoskeletal:     Cervical back: Neck supple.     Right lower leg: No edema.     Left lower leg: No edema.  Skin:    General: Skin is warm and dry.     Coloration: Skin is not jaundiced or pale.  Neurological:     Mental Status: She is alert.  Psychiatric:        Mood and Affect: Mood normal.     ED Results / Procedures / Treatments   Labs (all labs ordered are listed, but only abnormal results are displayed) Labs Reviewed - No data to display  EKG None  Radiology No results found.  Procedures   Medications Ordered in ED Medications  tetracaine (PONTOCAINE) 0.5 % ophthalmic  solution 2 drop (2 drops Both Eyes Given 12/24/20 1632)  fluorescein ophthalmic strip 1 strip (1 strip Both Eyes Given 12/24/20 1632)    ED Course  I have reviewed the triage vital signs and the nursing notes.  Pertinent labs & imaging results that were available during my care of the patient were reviewed by me and considered in my medical decision making (see  chart for details).    MDM Rules/Calculators/A&P                         Initial impression-patient presents with eye irritation of the left eye.  She is alert, does not appear acute distress, vital signs reassuring.  Will perform I exam work-up, visual acuity, ocular pressures, fluorescein , slit-lamp.  Work-up-   Visual Acuity  Right Eye Distance: 20/40 Left Eye Distance: 20/25 Bilateral Distance: 20/20  Intraocular pressure left 14  Intraocular pressure right 18  Using fluorescein stain as well as a wood lamp there is no pooling of the dye, no gross abnormalities present.  Using a slit-lamp there is no foreign bodies present, no rust ring, no Sendil sign.  Consult concern for follow-up, will consult with ophthalmology for further recommendations.  Spoke with Dr. Haynes Dage of ophthalmology he agrees with starting patient on antibiotics and he would like to see her sometime next week.  She should call his office to schedule a follow-up appointment.  Rule out- I have low suspicion for orbital cellulitis or periseptal cellulitis as patient had no pain with EOMs, there is no induration, fluctuance noted on exam around eyes.  Low suspicion for keratitis as patient does not wear contacts, eyes were visualized there is no visible dendritic lesion noted, there is no severe amount of purulent discharge noted.  Low suspicion for ocular globe rupture as there is no Sendil sign, intraocular pressure within normal limits. low suspicion for glaucoma as patient denies painful visual changes.  Low suspicion for hyphema as patient denies recent trauma  to the area no blood noted in the anterior chamber.  Low suspicion for corneal abrasion as there is no pooling of the fluorescein stain.  Plan-I suspect patient may be suffering from traumatic iritis as she complains of photophobia and a gritty sensation within her eye but cannot exclude possible infection.  will start her on antibiotics and have her follow-up with ophthalmology for further evaluation..  Vital signs have remained stable, no indication for hospital admission.   Patient given at home care as well strict return precautions.  Patient verbalized that they understood agreed to said plan.   Final Clinical Impression(s) / ED Diagnoses Final diagnoses:  Left eye pain    Rx / DC Orders ED Discharge Orders         Ordered    ofloxacin (OCUFLOX) 0.3 % ophthalmic solution  4 times daily        12/24/20 1759           Carroll Sage, PA-C 12/24/20 1804    Bethann Berkshire, MD 12/26/20 [phone]

## 2020-12-24 NOTE — Discharge Instructions (Signed)
Exam looks reassuring, started you  on antibiotics please take as prescribed.  You may also take over-the-counter pain medications like ibuprofen and or Tylenol every 6 hours as needed please follow dosing on the back of bottle.  I want you to follow-up with ophthalmology for further evaluation, given contact for Dr. Haynes Dage please call to schedule your appointment for next week.    Come back to the emergency department if you develop chest pain, shortness of breath, severe abdominal pain, uncontrolled nausea, vomiting, diarrhea.

## 2020-12-24 NOTE — Progress Notes (Signed)
Acute Office Visit  Subjective:    Patient ID: Stephanie Hendrix, female    DOB: [birth year], 65 y.o.   MRN: [id number]  Chief Complaint  Patient presents with  . Follow-up  . Eye Pain    L eye pain, draining and irritation x 1 month.     HPI Patient is in today for left eye pain.  Past Medical History:  Diagnosis Date  . Allergy   . Syncope   . Tobacco abuse 12/22/2016    Past Surgical History:  Procedure Laterality Date  . CHOLECYSTECTOMY      Family History  Problem Relation Age of Onset  . COPD Mother   . Cancer Father   . Breast cancer Maternal Grandmother   . COPD Brother     Social History   Socioeconomic History  . Marital status: Divorced    Spouse name: Not on file  . Number of children: Not on file  . Years of education: Not on file  . Highest education level: Not on file  Occupational History  . Not on file  Tobacco Use  . Smoking status: Current Every Day Smoker    Packs/day: 1.00    Years: 43.00    Pack years: 43.00    Types: Cigarettes  . Smokeless tobacco: Never Used  Vaping Use  . Vaping Use: Never used  Substance and Sexual Activity  . Alcohol use: No  . Drug use: No  . Sexual activity: Not Currently    Birth control/protection: Post-menopausal  Other Topics Concern  . Not on file  Social History Narrative  . Not on file   Social Determinants of Health   Financial Resource Strain: Low Risk   . Difficulty of Paying Living Expenses: Not hard at all  Food Insecurity: No Food Insecurity  . Worried About Charity fundraiser in the Last Year: Never true  . Ran Out of Food in the Last Year: Never true  Transportation Needs: No Transportation Needs  . Lack of Transportation (Medical): No  . Lack of Transportation (Non-Medical): No  Physical Activity: Sufficiently Active  . Days of Exercise per Week: 3 days  . Minutes of Exercise per Session: 60 min  Stress: No Stress Concern Present  . Feeling of Stress : Only a little  Social  Connections: Socially Isolated  . Frequency of Communication with Friends and Family: Three times a week  . Frequency of Social Gatherings with Friends and Family: Twice a week  . Attends Religious Services: Never  . Active Member of Clubs or Organizations: No  . Attends Archivist Meetings: Never  . Marital Status: Separated  Intimate Partner Violence: Not At Risk  . Fear of Current or Ex-Partner: No  . Emotionally Abused: No  . Physically Abused: No  . Sexually Abused: No    Outpatient Medications Prior to Visit  Medication Sig Dispense Refill  . naproxen (NAPROSYN) 500 MG tablet Take 1 tablet (500 mg total) by mouth 2 (two) times daily with a meal. 60 tablet 5  . varenicline (CHANTIX STARTING MONTH PAK) 0.5 MG X 11 & 1 MG X 42 tablet Take one 0.5 mg tablet by mouth once daily for 3 days, then increase to one 0.5 mg tablet twice daily for 4 days, then increase to one 1 mg tablet twice daily. (Patient not taking: Reported on 12/24/2020) 53 tablet 0   No facility-administered medications prior to visit.    Allergies  Allergen Reactions  . Neomycin Rash  Review of Systems  Constitutional: Negative.   Eyes: Positive for photophobia, pain, redness and visual disturbance.       -to left eye       Objective:    Physical Exam Constitutional:      Appearance: Normal appearance.  Eyes:     Conjunctiva/sclera:     Left eye: Left conjunctiva is injected.     Comments: -swelling to left eyelid and periorbital tissue -photophobia and pain to left eye when assessing cranial nerves of right eye -deferred rest of exam to ophthalmology d/t pain  Neurological:     Mental Status: She is alert.     BP 128/89   Pulse 74   Temp 98 F (36.7 C)   Resp 20   Ht 5' 6.5" (1.689 m)   Wt 130 lb (59 kg)   SpO2 95%   BMI 20.67 kg/m  Wt Readings from Last 3 Encounters:  12/24/20 130 lb (59 kg)  12/04/20 127 lb 11.2 oz (57.9 kg)  01/30/20 133 lb 12.8 oz (60.7 kg)    Health  Maintenance Due  Topic Date Due  . Hepatitis C Screening  Never done  . COLONOSCOPY (Pts 45-43yr Insurance coverage will need to be confirmed)  Never done  . MAMMOGRAM  Never done  . DEXA SCAN  Never done  . PNA vac Low Risk Adult (1 of 2 - PCV13) Never done  . COVID-19 Vaccine (2 - Booster for Janssen series) 02/01/2020    There are no preventive care reminders to display for this patient.   No results found for: TSH Lab Results  Component Value Date   WBC 8.8 08/24/2018   HGB 13.1 08/24/2018   HCT 41.6 08/24/2018   MCV 73.6 (L) 08/24/2018   PLT 230 08/24/2018   Lab Results  Component Value Date   NA 136 08/24/2018   K 3.9 08/24/2018   CO2 22 08/24/2018   GLUCOSE 107 (H) 08/24/2018   BUN 12 08/24/2018   CREATININE 0.82 08/24/2018   BILITOT 0.7 08/24/2018   ALKPHOS 75 08/24/2018   AST 62 (H) 08/24/2018   ALT 36 08/24/2018   PROT 7.5 08/24/2018   ALBUMIN 4.0 08/24/2018   CALCIUM 8.9 08/24/2018   ANIONGAP 7 08/24/2018   No results found for: CHOL No results found for: HDL No results found for: LDLCALC No results found for: TRIG No results found for: CHOLHDL No results found for: HGBA1C     Assessment & Plan:   Problem List Items Addressed This Visit      Other   Acute left eye pain    -painful red eye with vision changes and light sensitivity -URGENT referral to ophthalmology -unsure of etiology -sent in ophthalmic polysporin; can start if indicated by ophtalmology      Relevant Medications   bacitracin-polymyxin b (POLYSPORIN) ophthalmic ointment   Other Relevant Orders   Ambulatory referral to Ophthalmology    Other Visit Diagnoses    Preventative health care    -  Primary   Relevant Orders   CBC with Differential/Platelet   CMP14+EGFR   Lipid Panel With LDL/HDL Ratio   HCV Ab w/Rflx to Verification   Screening due       Relevant Orders   HCV Ab w/Rflx to Verification       Meds ordered this encounter  Medications  .  bacitracin-polymyxin b (POLYSPORIN) ophthalmic ointment    Sig: Place 1 application into the left eye 3 (three) times daily. apply to eye  every 12 hours while awake    Dispense:  3.5 g    Refill:  0     Noreene Larsson, NP

## 2020-12-24 NOTE — Patient Instructions (Addendum)
For your eye pain, I sent an urgent referral to ophthalmology. I also sent in an antibiotic. If you aren't able to see ophthalmology today, please start the antibiotic. I am not sure if there is an infectious cause to your eye issues, and a ophthalmologist needs to do a complete eye exam as soon as possible.  We will get fasting labs today, and will complete a physical in a week.

## 2020-12-24 NOTE — Assessment & Plan Note (Signed)
-  painful red eye with vision changes and light sensitivity -URGENT referral to ophthalmology -unsure of etiology -sent in ophthalmic polysporin; can start if indicated by ophtalmology

## 2020-12-25 LAB — LIPID PANEL WITH LDL/HDL RATIO
Cholesterol, Total: 187 mg/dL (ref 100–199)
HDL: 68 mg/dL (ref >39)
LDL Chol Calc (NIH): 101 mg/dL — ABNORMAL HIGH (ref 0–99)
LDL/HDL Ratio: 1.5 ratio (ref 0.0–3.2)
Triglycerides: 100 mg/dL (ref 0–149)
VLDL Cholesterol Cal: 18 mg/dL (ref 5–40)

## 2020-12-25 LAB — CBC WITH DIFFERENTIAL/PLATELET
Basophils Absolute: 0 10*3/uL (ref 0.0–0.2)
Basos: 0 %
EOS (ABSOLUTE): 0.1 10*3/uL (ref 0.0–0.4)
Eos: 2 %
Hematocrit: 43.4 % (ref 34.0–46.6)
Hemoglobin: 13.4 g/dL (ref 11.1–15.9)
Immature Grans (Abs): 0 10*3/uL (ref 0.0–0.1)
Immature Granulocytes: 0 %
Lymphocytes Absolute: 5.2 10*3/uL — ABNORMAL HIGH (ref 0.7–3.1)
Lymphs: 54 %
MCH: 23.2 pg — ABNORMAL LOW (ref 26.6–33.0)
MCHC: 30.9 g/dL — ABNORMAL LOW (ref 31.5–35.7)
MCV: 75 fL — ABNORMAL LOW (ref 79–97)
Monocytes Absolute: 0.6 10*3/uL (ref 0.1–0.9)
Monocytes: 6 %
Neutrophils Absolute: 3.7 10*3/uL (ref 1.4–7.0)
Neutrophils: 38 %
Platelets: 230 10*3/uL (ref 150–450)
RBC: 5.78 x10E6/uL — ABNORMAL HIGH (ref 3.77–5.28)
RDW: 15 % (ref 11.7–15.4)
WBC: 9.6 10*3/uL (ref 3.4–10.8)

## 2020-12-25 LAB — CMP14+EGFR
ALT: 11 IU/L (ref 0–32)
AST: 15 IU/L (ref 0–40)
Albumin/Globulin Ratio: 1.6 (ref 1.2–2.2)
Albumin: 4.2 g/dL (ref 3.8–4.8)
Alkaline Phosphatase: 80 IU/L (ref 44–121)
BUN/Creatinine Ratio: 16 (ref 12–28)
BUN: 13 mg/dL (ref 8–27)
Bilirubin Total: <0.2 mg/dL (ref 0.0–1.2)
CO2: 20 mmol/L (ref 20–29)
Calcium: 9.2 mg/dL (ref 8.7–10.3)
Chloride: 105 mmol/L (ref 96–106)
Creatinine, Ser: 0.83 mg/dL (ref 0.57–1.00)
Globulin, Total: 2.6 g/dL (ref 1.5–4.5)
Glucose: 86 mg/dL (ref 65–99)
Potassium: 4.4 mmol/L (ref 3.5–5.2)
Sodium: 142 mmol/L (ref 134–144)
Total Protein: 6.8 g/dL (ref 6.0–8.5)
eGFR: 78 mL/min/{1.73_m2} (ref >59)

## 2020-12-25 LAB — HCV AB W/RFLX TO VERIFICATION: HCV Ab: <0.1 s/co ratio (ref 0.0–0.9)

## 2020-12-25 LAB — HCV INTERPRETATION

## 2020-12-27 NOTE — Progress Notes (Signed)
Labs are stable from the last draw. I am adding an iron panel since the [facility] or color of the RBCs is a little pale.

## 2020-12-28 ENCOUNTER — Encounter: Payer: Medicare HMO | Admitting: Nurse Practitioner

## 2020-12-29 LAB — SPECIMEN STATUS REPORT

## 2020-12-29 LAB — FERRITIN: Ferritin: 256 ng/mL — ABNORMAL HIGH (ref 15–150)

## 2020-12-29 LAB — IRON AND TIBC
Iron Saturation: 34 % (ref 15–55)
Iron: 82 ug/dL (ref 27–139)
Total Iron Binding Capacity: 244 ug/dL — ABNORMAL LOW (ref 250–450)
UIBC: 162 ug/dL (ref 118–369)

## 2020-12-29 NOTE — Progress Notes (Signed)
Her iron levels are great. No need to add a supplement.

## 2021-01-12 ENCOUNTER — Encounter: Payer: Medicare HMO | Admitting: Nurse Practitioner

## 2021-01-12 ENCOUNTER — Ambulatory Visit (HOSPITAL_COMMUNITY): Payer: Medicare HMO

## 2021-01-19 ENCOUNTER — Ambulatory Visit (HOSPITAL_COMMUNITY)
Admission: RE | Admit: 2021-01-19 | Discharge: 2021-01-19 | Disposition: A | Discharge status: Home / Self Care | Payer: Medicare HMO | Source: Ambulatory Visit | Attending: Nurse Practitioner | Admitting: Nurse Practitioner

## 2021-01-19 DIAGNOSIS — Z1231 Encounter for screening mammogram for malignant neoplasm of breast: Secondary | ICD-10-CM | POA: Insufficient documentation

## 2021-02-08 DIAGNOSIS — I951 Orthostatic hypotension: Secondary | ICD-10-CM | POA: Diagnosis not present

## 2021-02-08 DIAGNOSIS — Z881 Allergy status to other antibiotic agents status: Secondary | ICD-10-CM | POA: Diagnosis not present

## 2021-02-08 DIAGNOSIS — Z825 Family history of asthma and other chronic lower respiratory diseases: Secondary | ICD-10-CM | POA: Diagnosis not present

## 2021-02-08 DIAGNOSIS — K08409 Partial loss of teeth, unspecified cause, unspecified class: Secondary | ICD-10-CM | POA: Diagnosis not present

## 2021-02-08 DIAGNOSIS — Z720 Tobacco use: Secondary | ICD-10-CM | POA: Diagnosis not present

## 2021-04-28 ENCOUNTER — Ambulatory Visit: Payer: Medicare HMO | Admitting: Nurse Practitioner

## 2021-10-13 ENCOUNTER — Ambulatory Visit: Payer: Medicare HMO | Admitting: Family Medicine

## 2021-10-13 ENCOUNTER — Ambulatory Visit (INDEPENDENT_AMBULATORY_CARE_PROVIDER_SITE_OTHER): Payer: Medicare HMO

## 2021-10-13 ENCOUNTER — Other Ambulatory Visit: Payer: Self-pay

## 2021-10-13 DIAGNOSIS — Z1211 Encounter for screening for malignant neoplasm of colon: Secondary | ICD-10-CM | POA: Diagnosis not present

## 2021-10-13 DIAGNOSIS — Z789 Other specified health status: Secondary | ICD-10-CM | POA: Diagnosis not present

## 2021-10-13 DIAGNOSIS — Z780 Asymptomatic menopausal state: Secondary | ICD-10-CM | POA: Diagnosis not present

## 2021-10-13 DIAGNOSIS — Z122 Encounter for screening for malignant neoplasm of respiratory organs: Secondary | ICD-10-CM

## 2021-10-13 DIAGNOSIS — Z23 Encounter for immunization: Secondary | ICD-10-CM

## 2021-10-13 DIAGNOSIS — Z0000 Encounter for general adult medical examination without abnormal findings: Secondary | ICD-10-CM | POA: Diagnosis not present

## 2021-10-13 NOTE — Patient Instructions (Addendum)
Stephanie Hendrix , Thank you for taking time to come for your Medicare Wellness Visit. I appreciate your ongoing commitment to your health goals. Please review the following plan we discussed and let me know if I can assist you in the future.   These are the goals we discussed:  Goals      Quit Smoking        This is a list of the screening recommended for you and due dates:  Health Maintenance  Topic Date Due   Pneumonia Vaccine (1 - PCV) Never done   Tetanus Vaccine  Never done   Colon Cancer Screening  Never done   Zoster (Shingles) Vaccine (1 of 2) Never done   DEXA scan (bone density measurement)  Never done   COVID-19 Vaccine (2 - Booster for Janssen series) 02/01/2020   Flu Shot  Never done   Mammogram  01/20/2023   Hepatitis C Screening: USPSTF Recommendation to screen - Ages 18-79 yo.  Completed   HPV Vaccine  Aged Out    Health Maintenance, Female Adopting a healthy lifestyle and getting preventive care are important in promoting health and wellness. Ask your health care provider about: The right schedule for you to have regular tests and exams. Things you can do on your own to prevent diseases and keep yourself healthy. What should I know about diet, weight, and exercise? Eat a healthy diet  Eat a diet that includes plenty of vegetables, fruits, low-fat dairy products, and lean protein. Do not eat a lot of foods that are high in solid fats, added sugars, or sodium. Maintain a healthy weight Body mass index (BMI) is used to identify weight problems. It estimates body fat based on height and weight. Your health care provider can help determine your BMI and help you achieve or maintain a healthy weight. Get regular exercise Get regular exercise. This is one of the most important things you can do for your health. Most adults should: Exercise for at least 150 minutes each week. The exercise should increase your heart rate and make you sweat (moderate-intensity exercise). Do  strengthening exercises at least twice a week. This is in addition to the moderate-intensity exercise. Spend less time sitting. Even light physical activity can be beneficial. Watch cholesterol and blood lipids Have your blood tested for lipids and cholesterol at 68 years of age, then have this test every 5 years. Have your cholesterol levels checked more often if: Your lipid or cholesterol levels are high. You are older than 68 years of age. You are at high risk for heart disease. What should I know about cancer screening? Depending on your health history and family history, you may need to have cancer screening at various ages. This may include screening for: Breast cancer. Cervical cancer. Colorectal cancer. Skin cancer. Lung cancer. What should I know about heart disease, diabetes, and high blood pressure? Blood pressure and heart disease High blood pressure causes heart disease and increases the risk of stroke. This is more likely to develop in people who have high blood pressure readings or are overweight. Have your blood pressure checked: Every 3-5 years if you are 81-47 years of age. Every year if you are 15 years old or older. Diabetes Have regular diabetes screenings. This checks your fasting blood sugar level. Have the screening done: Once every three years after age 72 if you are at a normal weight and have a low risk for diabetes. More often and at a younger age if you  are overweight or have a high risk for diabetes. What should I know about preventing infection? Hepatitis B If you have a higher risk for hepatitis B, you should be screened for this virus. Talk with your health care provider to find out if you are at risk for hepatitis B infection. Hepatitis C Testing is recommended for: Everyone born from [birth year] through 1967. Anyone with known risk factors for hepatitis C. Sexually transmitted infections (STIs) Get screened for STIs, including gonorrhea and chlamydia,  if: You are sexually active and are younger than 68 years of age. You are older than 68 years of age and your health care provider tells you that you are at risk for this type of infection. Your sexual activity has changed since you were last screened, and you are at increased risk for chlamydia or gonorrhea. Ask your health care provider if you are at risk. Ask your health care provider about whether you are at high risk for HIV. Your health care provider may recommend a prescription medicine to help prevent HIV infection. If you choose to take medicine to prevent HIV, you should first get tested for HIV. You should then be tested every 3 months for as long as you are taking the medicine. Pregnancy If you are about to stop having your period (premenopausal) and you may become pregnant, seek counseling before you get pregnant. Take 400 to 800 micrograms (mcg) of folic acid every day if you become pregnant. Ask for birth control (contraception) if you want to prevent pregnancy. Osteoporosis and menopause Osteoporosis is a disease in which the bones lose minerals and strength with aging. This can result in bone fractures. If you are 15 years old or older, or if you are at risk for osteoporosis and fractures, ask your health care provider if you should: Be screened for bone loss. Take a calcium or vitamin D supplement to lower your risk of fractures. Be given hormone replacement therapy (HRT) to treat symptoms of menopause. Follow these instructions at home: Alcohol use Do not drink alcohol if: Your health care provider tells you not to drink. You are pregnant, may be pregnant, or are planning to become pregnant. If you drink alcohol: Limit how much you have to: 0-1 drink a day. Know how much alcohol is in your drink. In the [place] one drink equals one 12 oz bottle of beer (355 mL), one 5 oz glass of wine (148 mL), or one 1 oz glass of hard liquor (44 mL). Lifestyle Do not use any products that  contain nicotine or tobacco. These products include cigarettes, chewing tobacco, and vaping devices, such as e-cigarettes. If you need help quitting, ask your health care provider. Do not use street drugs. Do not share needles. Ask your health care provider for help if you need support or information about quitting drugs. General instructions Schedule regular health, dental, and eye exams. Stay current with your vaccines. Tell your health care provider if: You often feel depressed. You have ever been abused or do not feel safe at home. Summary Adopting a healthy lifestyle and getting preventive care are important in promoting health and wellness. Follow your health care provider's instructions about healthy diet, exercising, and getting tested or screened for diseases. Follow your health care provider's instructions on monitoring your cholesterol and blood pressure. This information is not intended to replace advice given to you by your health care provider. Make sure you discuss any questions you have with your health care provider. Document Revised: 01/31/2021  Document Reviewed: 01/31/2021 Elsevier Patient Education  2022 [employer]

## 2021-10-13 NOTE — Progress Notes (Signed)
Subjective:   Konrad FelixFannie G Offield is a 66 y.o. female who presents for Medicare Annual (Subsequent) preventive examination.  Review of Systems    Defer to PCP Cardiac Risk Factors include: advanced age (>1455men, 35>65 women);smoking/ tobacco exposure     Objective:    There were no vitals filed for this visit. There is no height or weight on file to calculate BMI.  Advanced Directives 10/13/2021 12/24/2020 12/04/2020 10/12/2020 01/27/2020 08/24/2018 12/20/2016  Does Patient Have a Medical Advance Directive? No No No No No No No  Would patient like information on creating a medical advance directive? Yes (ED - Information included in AVS) No - Patient declined No - Patient declined No - Patient declined No - Patient declined No - Patient declined -    Current Medications (verified) Outpatient Encounter Medications as of 10/13/2021  Medication Sig   [DISCONTINUED] bacitracin-polymyxin b (POLYSPORIN) ophthalmic ointment Place 1 application into the left eye 3 (three) times daily. apply to eye every 12 hours while awake   [DISCONTINUED] naproxen (NAPROSYN) 500 MG tablet Take 1 tablet (500 mg total) by mouth 2 (two) times daily with a meal.   No facility-administered encounter medications on file as of 10/13/2021.    Allergies (verified) Neomycin   History: Past Medical History:  Diagnosis Date   Allergy    Syncope    Tobacco abuse 12/22/2016   Past Surgical History:  Procedure Laterality Date   CHOLECYSTECTOMY     Family History  Problem Relation Age of Onset   COPD Mother    Cancer Father    Hypertension Sister    Diabetes Sister    Heart disease Sister    Alcohol abuse Brother    COPD Brother    Breast cancer Maternal Grandmother    Social History   Socioeconomic History   Marital status: Divorced    Spouse name: Not on file   Number of children: 6   Years of education: 12   Highest education level: Not on file  Occupational History   Occupation: retired    Comment: Cook  at [employer]  Tobacco Use   Smoking status: Every Day    Packs/day: 1.00    Years: 43.00    Pack years: 43.00    Types: Cigarettes   Smokeless tobacco: Never  Vaping Use   Vaping Use: Never used  Substance and Sexual Activity   Alcohol use: No   Drug use: No   Sexual activity: Not Currently    Birth control/protection: Post-menopausal  Other Topics Concern   Not on file  Social History Narrative   Not on file   Social Determinants of Health   Financial Resource Strain: Low Risk    Difficulty of Paying Living Expenses: Not hard at all  Food Insecurity: No Food Insecurity   Worried About Programme researcher, broadcasting/film/videounning Out of Food in the Last Year: Never true   Ran Out of Food in the Last Year: Never true  Transportation Needs: No Transportation Needs   Lack of Transportation (Medical): No   Lack of Transportation (Non-Medical): No  Physical Activity: Sufficiently Active   Days of Exercise per Week: 7 days   Minutes of Exercise per Session: 30 min  Stress: No Stress Concern Present   Feeling of Stress : Not at all  Social Connections: Moderately Isolated   Frequency of Communication with Friends and Family: More than three times a week   Frequency of Social Gatherings with Friends and Family: More than three times a week  Attends Religious Services: 1 to 4 times per year   Active Member of Clubs or Organizations: No   Attends Banker Meetings: Never   Marital Status: Divorced    Tobacco Counseling Ready to quit: Not Answered Counseling given: Not Answered   Clinical Intake:  Pre-visit preparation completed: No  Pain : No/denies pain     Nutritional Risks: None Diabetes: No  How often do you need to have someone help you when you read instructions, pamphlets, or other written materials from your doctor or pharmacy?: 1 - Never What is the last grade level you completed in school?: 12  Diabetic?no     Information entered by :: Cordelia Pen   Activities of Daily  Living In your present state of health, do you have any difficulty performing the following activities: 10/13/2021  Hearing? N  Vision? N  Difficulty concentrating or making decisions? N  Walking or climbing stairs? N  Dressing or bathing? N  Doing errands, shopping? N  Preparing Food and eating ? N  Using the Toilet? N  In the past six months, have you accidently leaked urine? N  Do you have problems with loss of bowel control? N  Managing your Medications? N  Managing your Finances? N  Housekeeping or managing your Housekeeping? N  Some recent data might be hidden    Patient Care Team: Heather Roberts, NP as PCP - General (Nurse Practitioner) Antoine Poche, MD as PCP - Cardiology (Cardiology)  Indicate any recent Medical Services you may have received from other than [facility] providers in the past year (date may be approximate).     Assessment:   This is a routine wellness examination for [facility].  Hearing/Vision screen No results found.  Dietary issues and exercise activities discussed: Current Exercise Habits: Home exercise routine, Type of exercise: walking, Time (Minutes): 30, Frequency (Times/Week): 7, Weekly Exercise (Minutes/Week): 210, Intensity: Moderate, Exercise limited by: None identified   Goals Addressed   None   Depression Screen PHQ 2/9 Scores 10/13/2021 12/24/2020 10/12/2020 10/12/2020 01/23/2020 01/14/2020  PHQ - 2 Score 0 0 0 0 0 0  PHQ- 9 Score - - - - 0 -    Fall Risk Fall Risk  10/13/2021 12/24/2020 10/12/2020 01/23/2020 01/14/2020  Falls in the past year? 0 0 0 0 0  Number falls in past yr: 0 0 0 0 0  Injury with Fall? 0 0 0 0 0  Risk for fall due to : No Fall Risks No Fall Risks No Fall Risks - -  Follow up Falls evaluation completed Falls evaluation completed Falls evaluation completed Falls evaluation completed -    FALL RISK PREVENTION PERTAINING TO THE HOME:  Any stairs in or around the home? Yes  If so, are there any without handrails? Yes  Home  free of loose throw rugs in walkways, pet beds, electrical cords, etc? Yes  Adequate lighting in your home to reduce risk of falls? Yes   ASSISTIVE DEVICES UTILIZED TO PREVENT FALLS:  Life alert? No  Use of a cane, walker or w/c? No  Grab bars in the bathroom? No  Shower chair or bench in shower? No  Elevated toilet seat or a handicapped toilet? No   TIMED UP AND GO:  Was the test performed? Yes .  Length of time to ambulate 10 feet: 8 sec.   Gait steady and fast without use of assistive device  Cognitive Function:     6CIT Screen 10/13/2021 10/12/2020  What Year?  0 points 0 points  What month? 0 points 0 points  What time? 0 points 0 points  Count back from 20 0 points 0 points  Months in reverse 2 points 0 points  Repeat phrase 0 points 0 points  Total Score 2 0    Immunizations Immunization History  Administered Date(s) Administered   Janssen (J&J) SARS-COV-2 Vaccination 12/07/2019    TDAP status: Due, Education has been provided regarding the importance of this vaccine. Advised may receive this vaccine at local pharmacy or Health Dept. Aware to provide a copy of the vaccination record if obtained from local pharmacy or Health Dept. Verbalized acceptance and understanding.  Flu Vaccine status: Completed at today's visit  Pneumococcal vaccine status: Due, Education has been provided regarding the importance of this vaccine. Advised may receive this vaccine at local pharmacy or Health Dept. Aware to provide a copy of the vaccination record if obtained from local pharmacy or Health Dept. Verbalized acceptance and understanding.  Covid-19 vaccine status: Information provided on how to obtain vaccines.   Qualifies for Shingles Vaccine? Yes   Zostavax completed No   Shingrix Completed?: No.    Education has been provided regarding the importance of this vaccine. Patient has been advised to call insurance company to determine out of pocket expense if they have not yet  received this vaccine. Advised may also receive vaccine at local pharmacy or Health Dept. Verbalized acceptance and understanding.  Screening Tests Health Maintenance  Topic Date Due   Pneumonia Vaccine 64+ Years old (1 - PCV) Never done   TETANUS/TDAP  Never done   COLONOSCOPY (Pts 45-27yrs Insurance coverage will need to be confirmed)  Never done   Zoster Vaccines- Shingrix (1 of 2) Never done   DEXA SCAN  Never done   COVID-19 Vaccine (2 - Booster for Janssen series) 02/01/2020   INFLUENZA VACCINE  Never done   MAMMOGRAM  01/20/2023   Hepatitis C Screening  Completed   HPV VACCINES  Aged Out    Health Maintenance  Health Maintenance Due  Topic Date Due   Pneumonia Vaccine 27+ Years old (1 - PCV) Never done   TETANUS/TDAP  Never done   COLONOSCOPY (Pts 45-35yrs Insurance coverage will need to be confirmed)  Never done   Zoster Vaccines- Shingrix (1 of 2) Never done   DEXA SCAN  Never done   COVID-19 Vaccine (2 - Booster for Janssen series) 02/01/2020   INFLUENZA VACCINE  Never done    Colorectal cancer screening: Referral to GI placed 10/13/2021. Pt aware the office will call re: appt.  Mammogram status: Completed 01/19/2021. Repeat every year  Bone Density status: Ordered 10/13/2021. Pt provided with contact info and advised to call to schedule appt.  Lung Cancer Screening: (Low Dose CT Chest recommended if Age 92-80 years, 30 pack-year currently smoking OR have quit w/in 15years.) does qualify.   Lung Cancer Screening Referral: 10/13/2021  Additional Screening:  Hepatitis C Screening: does qualify; Completed 12/24/2020  Vision Screening: Recommended annual ophthalmology exams for early detection of glaucoma and other disorders of the eye. Is the patient up to date with their annual eye exam?  Yes  Who is the provider or what is the name of the office in which the patient attends annual eye exams? [facility] If pt is not established with a provider, would  they like to be referred to a provider to establish care? No .   Dental Screening: Recommended annual dental exams for proper oral hygiene  Community Resource Referral / Chronic Care Management: CRR required this visit?  Yes   CCM required this visit?  No      Plan:     I have personally reviewed and noted the following in the patients chart:   Medical and social history Use of alcohol, tobacco or illicit drugs  Current medications and supplements including opioid prescriptions.  Functional ability and status Nutritional status Physical activity Advanced directives List of other physicians Hospitalizations, surgeries, and ER visits in previous 12 months Vitals Screenings to include cognitive, depression, and falls Referrals and appointments  In addition, I have reviewed and discussed with patient certain preventive protocols, quality metrics, and best practice recommendations. A written personalized care plan for preventive services as well as general preventive health recommendations were provided to patient.     Glendale ChardSherry D Fayola Meckes, CMA   10/13/2021   Nurse Notes:  [name] , Thank you for taking time to come for your Medicare Wellness Visit. I appreciate your ongoing commitment to your health goals. Please review the following plan we discussed and let me know if I can assist you in the future.   These are the goals we discussed:  Goals      Quit Smoking        This is a list of the screening recommended for you and due dates:  Health Maintenance  Topic Date Due   Pneumonia Vaccine (1 - PCV) Never done   Tetanus Vaccine  Never done   Colon Cancer Screening  Never done   Zoster (Shingles) Vaccine (1 of 2) Never done   DEXA scan (bone density measurement)  Never done   COVID-19 Vaccine (2 - Booster for Janssen series) 02/01/2020   Flu Shot  Never done   Mammogram  01/20/2023   Hepatitis C Screening: USPSTF Recommendation to screen - Ages 18-79 yo.  Completed    HPV Vaccine  Aged Out

## 2021-10-19 ENCOUNTER — Ambulatory Visit (INDEPENDENT_AMBULATORY_CARE_PROVIDER_SITE_OTHER): Payer: Medicare HMO | Admitting: Nurse Practitioner

## 2021-10-19 ENCOUNTER — Other Ambulatory Visit: Payer: Self-pay

## 2021-10-19 ENCOUNTER — Encounter: Payer: Self-pay | Admitting: Nurse Practitioner

## 2021-10-19 ENCOUNTER — Ambulatory Visit: Payer: Medicare HMO

## 2021-10-19 ENCOUNTER — Encounter: Payer: Self-pay | Admitting: Internal Medicine

## 2021-10-19 DIAGNOSIS — R051 Acute cough: Secondary | ICD-10-CM

## 2021-10-19 DIAGNOSIS — R0602 Shortness of breath: Secondary | ICD-10-CM

## 2021-10-19 MED ORDER — BENZONATATE 100 MG PO CAPS: 100.0000 mg | ORAL_CAPSULE | Freq: Two times a day (BID) | ORAL | 0 refills | Status: DC | PRN

## 2021-10-19 MED ORDER — ALBUTEROL SULFATE HFA 108 (90 BASE) MCG/ACT IN AERS: 2.0000 | INHALATION_SPRAY | Freq: Four times a day (QID) | RESPIRATORY_TRACT | 0 refills | Status: AC | PRN

## 2021-10-19 NOTE — Progress Notes (Signed)
Virtual Visit via Telephone Note  I connected with Stephanie Hendrix on 10/19/21 at  9:00 AM EST by telephone and verified that I am speaking with the correct person using two identifiers.  I spent 7 minutes talking to the patient and reviewing her chart  Location: Patient: home  Provider: office   I discussed the limitations, risks, security and privacy concerns of performing an evaluation and management service by telephone and the availability of in person appointments. I also discussed with the patient that there may be a patient responsible charge related to this service. The patient expressed understanding and agreed to proceed.   History of Present Illness: Pt c/o Nagging cough, sob,wheezing that's started 2 weeks ago  she cant walk to the bathroom without been sob. Cough sometimes has clear  plegym .  She uses mother's uses albuterol inhaler and Symbicort, they help her symptoms.  She is a current smoker, started smoking  since age 34, does one pack a day now. Denies , sneezing, HA,sinus pressure, fever, chills, body aches, bloody sputum She has tried chantix for smoking cessation , it made herr smoke more.She is up to date with flu vaccine, she has had one covid booster.   Observations/Objective:   Assessment and Plan: Sob, wheezing. Albuterol inhaler PRN  COUGH: TESSALON 1000MG  BID PRN  Obtain COVID test             Drink plenty of water to stay hydrated Patient told to call back to the office if her symptoms not get better or gets worse Follow Up Instructions:    I discussed the assessment and treatment plan with the patient. The patient was provided an opportunity to ask questions and all were answered. The patient agreed with the plan and demonstrated an understanding of the instructions.   The patient was advised to call back or seek an in-person evaluation if the symptoms worsen or if the condition fails to improve as anticipated.

## 2021-10-19 NOTE — Assessment & Plan Note (Signed)
TESSALON 1000MG  BID PRN  Obtain COVID test             Drink plenty of water to stay hydrated Patient told to call back to the office if her symptoms not get better or gets worse

## 2021-10-19 NOTE — Addendum Note (Signed)
Addended by: Cindra Presume D on: 10/19/2021 10:51 AM   Modules accepted: Orders

## 2021-10-19 NOTE — Assessment & Plan Note (Signed)
Current everyday smoker. Rx albuterol inhaler as needed. CT scan to screen for lung cancer screening scheduled for March 2023.

## 2021-10-21 LAB — NOVEL CORONAVIRUS, NAA: SARS-CoV-2, NAA: NOT DETECTED

## 2021-10-21 LAB — SARS-COV-2, NAA 2 DAY TAT

## 2021-10-21 NOTE — Progress Notes (Signed)
Test negative, pls notify pt , thanks

## 2021-10-22 ENCOUNTER — Encounter (HOSPITAL_COMMUNITY): Payer: Self-pay

## 2021-10-22 ENCOUNTER — Other Ambulatory Visit: Payer: Self-pay

## 2021-10-22 ENCOUNTER — Emergency Department (HOSPITAL_COMMUNITY): Payer: Medicare HMO

## 2021-10-22 ENCOUNTER — Observation Stay (HOSPITAL_COMMUNITY)
Admission: EM | Admit: 2021-10-22 | Discharge: 2021-10-23 | Disposition: A | Discharge status: Home / Self Care | Payer: Medicare HMO | Attending: Internal Medicine | Admitting: Internal Medicine

## 2021-10-22 DIAGNOSIS — R069 Unspecified abnormalities of breathing: Secondary | ICD-10-CM | POA: Diagnosis not present

## 2021-10-22 DIAGNOSIS — Z20822 Contact with and (suspected) exposure to covid-19: Secondary | ICD-10-CM | POA: Diagnosis not present

## 2021-10-22 DIAGNOSIS — R062 Wheezing: Secondary | ICD-10-CM | POA: Diagnosis not present

## 2021-10-22 DIAGNOSIS — I10 Essential (primary) hypertension: Secondary | ICD-10-CM | POA: Diagnosis not present

## 2021-10-22 DIAGNOSIS — J441 Chronic obstructive pulmonary disease with (acute) exacerbation: Principal | ICD-10-CM | POA: Insufficient documentation

## 2021-10-22 DIAGNOSIS — Z743 Need for continuous supervision: Secondary | ICD-10-CM | POA: Diagnosis not present

## 2021-10-22 DIAGNOSIS — J9601 Acute respiratory failure with hypoxia: Secondary | ICD-10-CM | POA: Diagnosis not present

## 2021-10-22 DIAGNOSIS — R0902 Hypoxemia: Secondary | ICD-10-CM | POA: Diagnosis not present

## 2021-10-22 DIAGNOSIS — R0602 Shortness of breath: Secondary | ICD-10-CM | POA: Diagnosis present

## 2021-10-22 DIAGNOSIS — F17210 Nicotine dependence, cigarettes, uncomplicated: Secondary | ICD-10-CM | POA: Insufficient documentation

## 2021-10-22 DIAGNOSIS — R69 Illness, unspecified: Secondary | ICD-10-CM | POA: Diagnosis not present

## 2021-10-22 LAB — CREATININE, SERUM
Creatinine, Ser: 0.8 mg/dL (ref 0.44–1.00)
GFR, Estimated: >60 mL/min (ref >60)

## 2021-10-22 LAB — BASIC METABOLIC PANEL
Anion gap: 7 (ref 5–15)
BUN: 9 mg/dL (ref 8–23)
CO2: 21 mmol/L — ABNORMAL LOW (ref 22–32)
Calcium: 8.4 mg/dL — ABNORMAL LOW (ref 8.9–10.3)
Chloride: 106 mmol/L (ref 98–111)
Creatinine, Ser: 0.79 mg/dL (ref 0.44–1.00)
GFR, Estimated: >60 mL/min (ref >60)
Glucose, Bld: 117 mg/dL — ABNORMAL HIGH (ref 70–99)
Potassium: 3.7 mmol/L (ref 3.5–5.1)
Sodium: 134 mmol/L — ABNORMAL LOW (ref 135–145)

## 2021-10-22 LAB — RESP PANEL BY RT-PCR (FLU A&B, COVID) ARPGX2
Influenza A by PCR: NEGATIVE
Influenza B by PCR: NEGATIVE
SARS Coronavirus 2 by RT PCR: NEGATIVE

## 2021-10-22 LAB — CBC
HCT: 43.2 % (ref 36.0–46.0)
HCT: 42.6 % (ref 36.0–46.0)
Hemoglobin: 12.8 g/dL (ref 12.0–15.0)
Hemoglobin: 13.6 g/dL (ref 12.0–15.0)
MCH: 23.1 pg — ABNORMAL LOW (ref 26.0–34.0)
MCH: 24.4 pg — ABNORMAL LOW (ref 26.0–34.0)
MCHC: 29.6 g/dL — ABNORMAL LOW (ref 30.0–36.0)
MCHC: 31.9 g/dL (ref 30.0–36.0)
MCV: 77.8 fL — ABNORMAL LOW (ref 80.0–100.0)
MCV: 76.5 fL — ABNORMAL LOW (ref 80.0–100.0)
Platelets: 212 10*3/uL (ref 150–400)
Platelets: 235 10*3/uL (ref 150–400)
RBC: 5.55 MIL/uL — ABNORMAL HIGH (ref 3.87–5.11)
RBC: 5.57 MIL/uL — ABNORMAL HIGH (ref 3.87–5.11)
RDW: 15.2 % (ref 11.5–15.5)
RDW: 15.3 % (ref 11.5–15.5)
WBC: 9.5 10*3/uL (ref 4.0–10.5)
WBC: 6.3 10*3/uL (ref 4.0–10.5)
nRBC: 0 % (ref 0.0–0.2)
nRBC: 0 % (ref 0.0–0.2)

## 2021-10-22 LAB — PROCALCITONIN: Procalcitonin: <0.1 ng/mL

## 2021-10-22 MED ORDER — BUDESONIDE 0.25 MG/2ML IN SUSP
0.2500 mg | Freq: Two times a day (BID) | RESPIRATORY_TRACT | Status: DC
  Administered 2021-10-22 – 2021-10-23 (×2): 0.25 mg via RESPIRATORY_TRACT
  Filled 2021-10-22 (×2): qty 2

## 2021-10-22 MED ORDER — ONDANSETRON HCL 4 MG/2ML IJ SOLN: 4.0000 mg | Freq: Four times a day (QID) | INTRAMUSCULAR | Status: DC | PRN

## 2021-10-22 MED ORDER — ALBUTEROL (5 MG/ML) CONTINUOUS INHALATION SOLN
10.0000 mg/h | INHALATION_SOLUTION | Freq: Once | RESPIRATORY_TRACT | Status: AC
  Administered 2021-10-22: 10 mg/h via RESPIRATORY_TRACT

## 2021-10-22 MED ORDER — ACETAMINOPHEN 325 MG PO TABS: 650.0000 mg | ORAL_TABLET | Freq: Four times a day (QID) | ORAL | Status: DC | PRN

## 2021-10-22 MED ORDER — DOXYCYCLINE HYCLATE 100 MG PO TABS
100.0000 mg | ORAL_TABLET | Freq: Two times a day (BID) | ORAL | Status: DC
  Administered 2021-10-22 – 2021-10-23 (×3): 100 mg via ORAL
  Filled 2021-10-22 (×3): qty 1

## 2021-10-22 MED ORDER — ACETAMINOPHEN 650 MG RE SUPP: 650.0000 mg | Freq: Four times a day (QID) | RECTAL | Status: DC | PRN

## 2021-10-22 MED ORDER — SODIUM CHLORIDE 0.9% FLUSH: 3.0000 mL | INTRAVENOUS | Status: DC | PRN

## 2021-10-22 MED ORDER — SODIUM CHLORIDE 0.9 % IV SOLN: 250.0000 mL | INTRAVENOUS | Status: DC | PRN

## 2021-10-22 MED ORDER — MAGNESIUM SULFATE 2 GM/50ML IV SOLN
2.0000 g | Freq: Once | INTRAVENOUS | Status: AC
  Administered 2021-10-22: 2 g via INTRAVENOUS
  Filled 2021-10-22: qty 50

## 2021-10-22 MED ORDER — METHYLPREDNISOLONE SODIUM SUCC 125 MG IJ SOLR
125.0000 mg | Freq: Once | INTRAMUSCULAR | Status: AC
  Administered 2021-10-22: 125 mg via INTRAVENOUS
  Filled 2021-10-22: qty 2

## 2021-10-22 MED ORDER — ONDANSETRON HCL 4 MG PO TABS: 4.0000 mg | ORAL_TABLET | Freq: Four times a day (QID) | ORAL | Status: DC | PRN

## 2021-10-22 MED ORDER — ALBUTEROL SULFATE (2.5 MG/3ML) 0.083% IN NEBU
INHALATION_SOLUTION | RESPIRATORY_TRACT | Status: AC
  Filled 2021-10-22: qty 12

## 2021-10-22 MED ORDER — IPRATROPIUM-ALBUTEROL 0.5-2.5 (3) MG/3ML IN SOLN
3.0000 mL | Freq: Four times a day (QID) | RESPIRATORY_TRACT | Status: DC
  Administered 2021-10-22 – 2021-10-23 (×3): 3 mL via RESPIRATORY_TRACT
  Filled 2021-10-22 (×3): qty 3

## 2021-10-22 MED ORDER — SODIUM CHLORIDE 0.9% FLUSH
3.0000 mL | Freq: Two times a day (BID) | INTRAVENOUS | Status: DC
  Administered 2021-10-22 (×2): 3 mL via INTRAVENOUS

## 2021-10-22 MED ORDER — ENOXAPARIN SODIUM 40 MG/0.4ML IJ SOSY
40.0000 mg | PREFILLED_SYRINGE | INTRAMUSCULAR | Status: DC
  Administered 2021-10-22: 40 mg via SUBCUTANEOUS
  Filled 2021-10-22: qty 0.4

## 2021-10-22 MED ORDER — BENZONATATE 100 MG PO CAPS: 100.0000 mg | ORAL_CAPSULE | Freq: Three times a day (TID) | ORAL | Status: DC | PRN

## 2021-10-22 MED ORDER — CHLORHEXIDINE GLUCONATE 0.12 % MT SOLN
15.0000 mL | Freq: Two times a day (BID) | OROMUCOSAL | Status: DC
  Administered 2021-10-22 – 2021-10-23 (×3): 15 mL via OROMUCOSAL
  Filled 2021-10-22 (×3): qty 15

## 2021-10-22 MED ORDER — ORAL CARE MOUTH RINSE
15.0000 mL | Freq: Two times a day (BID) | OROMUCOSAL | Status: DC
  Administered 2021-10-22 (×2): 15 mL via OROMUCOSAL

## 2021-10-22 MED ORDER — METHYLPREDNISOLONE SODIUM SUCC 40 MG IJ SOLR
40.0000 mg | Freq: Two times a day (BID) | INTRAMUSCULAR | Status: DC
  Administered 2021-10-22: 40 mg via INTRAVENOUS
  Filled 2021-10-22: qty 1

## 2021-10-22 NOTE — ED Notes (Signed)
SATURATION QUALIFICATIONS: (This note is used to comply with regulatory documentation for home oxygen)  Patient Saturations on Room Air at Rest = 86%    Patient Saturations on 2 Liters of oxygen while at rest = 94% %

## 2021-10-22 NOTE — ED Triage Notes (Signed)
Reports cough and congestion x 2 weeks.  Reports had neg covid test yesterday.  bilateral wheezing noted by ems and albuterol tx given pta.  Pt is alert and oriented.  Wheezing noted still on arrival.  100% on RA.

## 2021-10-22 NOTE — ED Provider Notes (Addendum)
[facility] EMERGENCY DEPARTMENT Provider Note   CSN: [id number] Arrival date & time: 10/22/21  0707     History  Chief Complaint  Patient presents with   Shortness of Breath    Stephanie Hendrix is a 66 y.o. female.  magnes   Shortness of Breath Associated symptoms: no fever    Patient denies any significant medical history but is a chronic smoker.  She denies having any history of COPD or asthma.  Patient states she is been having issues with cough and shortness of breath for the last couple weeks.  She saw her doctor last Thursday and was given an inhaler.  She had a negative COVID test.  Patient states her symptoms have persisted since then and have gotten worse.  This morning she felt extremely short of breath and could not catch her breath.  She denies any chest pain.  She denies any fevers.  She had to call EMS and they noted to have significant wheezing.  She was given a breathing treatment and is actually feeling somewhat better.  No leg swelling.  No other complaints  Home Medications Prior to Admission medications   Medication Sig Start Date End Date Taking? Authorizing Provider  albuterol (VENTOLIN HFA) 108 (90 Base) MCG/ACT inhaler Inhale 2 puffs into the lungs every 6 (six) hours as needed for wheezing or shortness of breath. 10/19/21   Paseda, Baird KayFolashade R, FNP  benzonatate (TESSALON) 100 MG capsule Take 1 capsule (100 mg total) by mouth 2 (two) times daily as needed for cough. 10/19/21   Donell BeersPaseda, Folashade R, FNP      Allergies    Neomycin    Review of Systems   Review of Systems  Constitutional:  Negative for fever.  Respiratory:  Positive for shortness of breath.    Physical Exam Updated Vital Signs BP 115/79    Pulse 64    Temp 97.8 F (36.6 C)    Resp (!) 21    Ht 1.651 m (5\' 5" )    Wt 61.2 kg    SpO2 92%    BMI 22.47 kg/m  Physical Exam Vitals and nursing note reviewed.  Constitutional:      General: She is not in acute distress.    Appearance: She is  well-developed.  HENT:     Head: Normocephalic and atraumatic.     Right Ear: External ear normal.     Left Ear: External ear normal.  Eyes:     General: No scleral icterus.       Right eye: No discharge.        Left eye: No discharge.     Conjunctiva/sclera: Conjunctivae normal.  Neck:     Trachea: No tracheal deviation.  Cardiovascular:     Rate and Rhythm: Normal rate and regular rhythm.  Pulmonary:     Effort: Tachypnea and accessory muscle usage present.     Breath sounds: No stridor. Wheezing present. No rales.  Abdominal:     General: Bowel sounds are normal. There is no distension.     Palpations: Abdomen is soft.     Tenderness: There is no abdominal tenderness. There is no guarding or rebound.  Musculoskeletal:        General: No tenderness or deformity.     Cervical back: Neck supple.  Skin:    General: Skin is warm and dry.     Findings: No rash.  Neurological:     General: No focal deficit present.  Mental Status: She is alert.     Cranial Nerves: No cranial nerve deficit (no facial droop, extraocular movements intact, no slurred speech).     Sensory: No sensory deficit.     Motor: No abnormal muscle tone or seizure activity.     Coordination: Coordination normal.  Psychiatric:        Mood and Affect: Mood normal.    ED Results / Procedures / Treatments   Labs (all labs ordered are listed, but only abnormal results are displayed) Labs Reviewed  BASIC METABOLIC PANEL - Abnormal; Notable for the following components:      Result Value   Sodium 134 (*)    CO2 21 (*)    Glucose, Bld 117 (*)    Calcium 8.4 (*)    All other components within normal limits  CBC - Abnormal; Notable for the following components:   RBC 5.55 (*)    MCV 77.8 (*)    MCH 23.1 (*)    MCHC 29.6 (*)    All other components within normal limits  RESP PANEL BY RT-PCR (FLU A&B, COVID) [id number]    Radiology DG Chest Port 1 View  Result Date: 10/22/2021 CLINICAL DATA:  66 year old  female with history of shortness of breath. Wheezing. EXAM: PORTABLE CHEST 1 VIEW COMPARISON:  Chest x-ray 12/20/2016. FINDINGS: Diffuse peribronchial cuffing. Lung volumes are normal. No consolidative airspace disease. No pleural effusions. No pneumothorax. Prominent nodules projecting over the mid to lower lungs bilaterally, strongly favored to represent nipple shadows. No other pulmonary nodule or mass noted. Pulmonary vasculature and the cardiomediastinal silhouette are within normal limits. Atherosclerosis in the thoracic aorta. IMPRESSION: 1. Severe diffuse peribronchial cuffing, concerning for an acute bronchitis. 2. Aortic atherosclerosis. 3. Prominent nodules in the mid to lower lungs bilaterally, favored to represent nipple shadows. Future chest radiograph should be obtained with bilateral nipple markers in place. Electronically Signed   By: Trudie Reed M.D.   On: 10/22/2021 07:59    Procedures Procedures    Medications Ordered in ED Medications  albuterol (PROVENTIL) (2.5 MG/3ML) 0.083% nebulizer solution (  Not Given 10/22/21 0844)  magnesium sulfate IVPB 2 g 50 mL (has no administration in time range)  methylPREDNISolone sodium succinate (SOLU-MEDROL) 125 mg/2 mL injection 125 mg (125 mg Intravenous Given 10/22/21 0744)  albuterol (PROVENTIL,VENTOLIN) solution continuous neb (10 mg/hr Nebulization Given 10/22/21 0839)    ED Course/ Medical Decision Making/ A&P Clinical Course as of 10/22/21 1120  Sat Oct 22, 2021  0833 CBC(!) White blood cell count hemoglobin normal [JK]  0833 Resp Panel by RT-PCR (Flu A&B, Covid) Nasopharyngeal Swab COVID and flu are negative [JK]  0833 Basic metabolic panel(!) No significant electrolyte abnormalities noted [JK]  0834 DG Chest Port 1 View Chest x-ray images and radiology report reviewed.  Consistent with acute bronchitis.  Findings also suggestive of chronic obstructive pulmonary disease [JK]  0835 Outside records reviewed.  Patient had a  virtual doctor's appointment on January 25.  Patient was prescribed Tessalon and an albuterol inhaler.  Patient was using her family members inhaler which had been helping somewhat [JK]  1107 Patient still has significant wheezing.  Patient was taken off oxygen and her saturations decreased into the 80s [JK]  1119 Case discussed with Dr Sherryll Burger regarding admission [JK]    Clinical Course User Index [JK] Linwood Dibbles, MD  Medical Decision Making Amount and/or Complexity of Data Reviewed Independent Historian: caregiver Labs: ordered. Decision-making details documented in ED Course. Radiology: ordered and independent interpretation performed. Decision-making details documented in ED Course.  Risk Prescription drug management. Drug therapy requiring intensive monitoring for toxicity. Decision regarding hospitalization.   Shortness of breath Patient with significant wheezing on exam.  She is a chronic smoker.  Findings are consistent with COPD exacerbation.  Patient with increase of work of breathing.  We will proceed with hour-long neb and steroids.  Patient may end up requiring admission if her symptoms do not improve significantly  Patient reassessed after her breathing treatments.  Her breathing is less labored but she still has wheezing.  Patient was taken off of supplemental oxygen and dropped down into the 80s.  She is not normally on any oxygen at home.  Museum ordered.  Patient will require admission for additional treatment.    Chronic tobacco use No formal diagnosis of COPD but x-ray suggests chronic obstructive lung disease.  Counseled on smoking cessation        Final Clinical Impression(s) / ED Diagnoses Final diagnoses:  COPD exacerbation (HCC)  Continuous dependence on cigarette smoking     Linwood Dibbles, MD 10/22/21 1110    Linwood Dibbles, MD 10/22/21 1120

## 2021-10-22 NOTE — ED Notes (Signed)
Placed on 2lpm o2 via [place] for o2 sats 86%.  O2 sats increased to 94%.

## 2021-10-22 NOTE — H&P (Signed)
History and Physical    Stephanie Hendrix:[id number] DOB: [birth year] DOA: 10/22/2021  PCP: Heather Roberts, NP   Patient coming from: Home  Chief Complaint: Dyspnea  HPI: Stephanie Hendrix is a 66 y.o. female with medical history significant for chronic tobacco abuse and suspected COPD who presented to the ED with cough and shortness of breath for the last 2 weeks.  She saw her PCP last Thursday and was prescribed an inhaler and was noted to have a negative COVID test.  She has tried using this inhaler with minimal to no relief.  Her symptoms appear to actually be getting worse.  She denies any fevers or chills or chest pain.  She was having such severe wheezing that EMS was called to bring her to the ED.  She was given a breathing treatment in route and was feeling better prior to arrival to ED. she continues to smoke 2 packs/day.   ED Course: Vital signs stable and patient afebrile with sodium level of 134.  Chest x-ray with findings of acute bronchitis noted.  She has been given a continuous nebulizer treatment as well as some magnesium and IV Solu-Medrol with improvement in her symptoms so far.  Noted to have oxygen desaturation into the 88th percentile for which she is requiring 2 L nasal cannula oxygen at the moment.  Review of Systems: Reviewed as noted above, otherwise negative.  Past Medical History:  Diagnosis Date   Allergy    Syncope    Tobacco abuse 12/22/2016    Past Surgical History:  Procedure Laterality Date   CHOLECYSTECTOMY       reports that she has been smoking cigarettes. She has a 43.00 pack-year smoking history. She has never used smokeless tobacco. She reports that she does not drink alcohol and does not use drugs.  Allergies  Allergen Reactions   Neomycin Rash    Family History  Problem Relation Age of Onset   COPD Mother    Cancer Father    Hypertension Sister    Diabetes Sister    Heart disease Sister    Alcohol abuse Brother    COPD Brother     Breast cancer Maternal Grandmother     Prior to Admission medications   Medication Sig Start Date End Date Taking? Authorizing Provider  albuterol (VENTOLIN HFA) 108 (90 Base) MCG/ACT inhaler Inhale 2 puffs into the lungs every 6 (six) hours as needed for wheezing or shortness of breath. 10/19/21   Paseda, Baird Kay, FNP  benzonatate (TESSALON) 100 MG capsule Take 1 capsule (100 mg total) by mouth 2 (two) times daily as needed for cough. 10/19/21   Donell Beers, FNP    Physical Exam: Vitals:   10/22/21 0712 10/22/21 0716 10/22/21 0830 10/22/21 1100  BP:  (!) 142/73 (!) 146/77 115/79  Pulse:  63 (!) 46 64  Resp:  (!) 24 18 (!) 21  Temp:  97.8 F (36.6 C)    SpO2: 100% 94% 99% 92%  Weight: 61.2 kg     Height: 5\' 5"  (1.651 m)       Constitutional: NAD, calm, comfortable Vitals:   10/22/21 0712 10/22/21 0716 10/22/21 0830 10/22/21 1100  BP:  (!) 142/73 (!) 146/77 115/79  Pulse:  63 (!) 46 64  Resp:  (!) 24 18 (!) 21  Temp:  97.8 F (36.6 C)    SpO2: 100% 94% 99% 92%  Weight: 61.2 kg     Height: 5\' 5"  (1.651 m)  Eyes: lids and conjunctivae normal Neck: normal, supple Respiratory: clear to auscultation bilaterally. Normal respiratory effort. No accessory muscle use.  Currently on 2 L nasal cannula oxygen Cardiovascular: Regular rate and rhythm, no murmurs. Abdomen: no tenderness, no distention. Bowel sounds positive.  Musculoskeletal:  No edema. Skin: no rashes, lesions, ulcers.  Psychiatric: Flat affect  Labs on Admission: I have personally reviewed following labs and imaging studies  CBC: Recent Labs  Lab 10/22/21 0752  WBC 9.5  HGB 12.8  HCT 43.2  MCV 77.8*  PLT 212   Basic Metabolic Panel: Recent Labs  Lab 10/22/21 0752  NA 134*  K 3.7  CL 106  CO2 21*  GLUCOSE 117*  BUN 9  CREATININE 0.79  CALCIUM 8.4*   GFR: Estimated Creatinine Clearance: 61.4 mL/min (by C-G formula based on SCr of 0.79 mg/dL). Liver Function Tests: No results for  input(s): AST, ALT, ALKPHOS, BILITOT, PROT, ALBUMIN in the last 168 hours. No results for input(s): LIPASE, AMYLASE in the last 168 hours. No results for input(s): AMMONIA in the last 168 hours. Coagulation Profile: No results for input(s): INR, PROTIME in the last 168 hours. Cardiac Enzymes: No results for input(s): CKTOTAL, CKMB, CKMBINDEX, TROPONINI in the last 168 hours. BNP (last 3 results) No results for input(s): PROBNP in the last 8760 hours. HbA1C: No results for input(s): HGBA1C in the last 72 hours. CBG: No results for input(s): GLUCAP in the last 168 hours. Lipid Profile: No results for input(s): CHOL, HDL, LDLCALC, TRIG, CHOLHDL, LDLDIRECT in the last 72 hours. Thyroid Function Tests: No results for input(s): TSH, T4TOTAL, FREET4, T3FREE, THYROIDAB in the last 72 hours. Anemia Panel: No results for input(s): VITAMINB12, FOLATE, FERRITIN, TIBC, IRON, RETICCTPCT in the last 72 hours. Urine analysis:    Component Value Date/Time   COLORURINE STRAW (A) 08/24/2018 1840   APPEARANCEUR CLEAR 08/24/2018 1840   LABSPEC 1.036 (H) 08/24/2018 1840   PHURINE 5.0 08/24/2018 1840   GLUCOSEU NEGATIVE 08/24/2018 1840   HGBUR SMALL (A) 08/24/2018 1840   BILIRUBINUR NEGATIVE 08/24/2018 1840   KETONESUR NEGATIVE 08/24/2018 1840   PROTEINUR NEGATIVE 08/24/2018 1840   UROBILINOGEN 2.0 (H) 09/19/2012 2319   NITRITE NEGATIVE 08/24/2018 1840   LEUKOCYTESUR NEGATIVE 08/24/2018 1840    Radiological Exams on Admission: DG Chest Port 1 View  Result Date: 10/22/2021 CLINICAL DATA:  66 year old female with history of shortness of breath. Wheezing. EXAM: PORTABLE CHEST 1 VIEW COMPARISON:  Chest x-ray 12/20/2016. FINDINGS: Diffuse peribronchial cuffing. Lung volumes are normal. No consolidative airspace disease. No pleural effusions. No pneumothorax. Prominent nodules projecting over the mid to lower lungs bilaterally, strongly favored to represent nipple shadows. No other pulmonary nodule or mass  noted. Pulmonary vasculature and the cardiomediastinal silhouette are within normal limits. Atherosclerosis in the thoracic aorta. IMPRESSION: 1. Severe diffuse peribronchial cuffing, concerning for an acute bronchitis. 2. Aortic atherosclerosis. 3. Prominent nodules in the mid to lower lungs bilaterally, favored to represent nipple shadows. Future chest radiograph should be obtained with bilateral nipple markers in place. Electronically Signed   By: Trudie Reed M.D.   On: 10/22/2021 07:59     Assessment/Plan Principal Problem:   COPD with acute exacerbation (HCC)    Acute hypoxemic respiratory failure secondary to COPD exacerbation with bronchitis -Wean oxygen as tolerated -Maintain on scheduled breathing treatments -IV Solu-Medrol twice daily -Pulmicort twice daily -Doxycycline -Antitussives -Referral to pulmonology in outpatient setting upon discharge  Ongoing tobacco abuse -Continues to smoke 2 packs/day -Counseled on cessation  DVT prophylaxis: Lovenox Code Status: Full Family Communication: Son at bedside Disposition Plan:Admit for COPD exacerbation Consults called:None Admission status: Obs, Tele  Thereasa Iannello D Sherryll BurgerShah DO [facility]  If 7PM-7AM, please contact night-coverage [email or web address]  10/22/2021, 11:32 AM

## 2021-10-22 NOTE — Progress Notes (Signed)
Called to get report from ED nurse at 11:40AM, nurse in with another patient at this time.Marland Kitchen

## 2021-10-23 DIAGNOSIS — R0602 Shortness of breath: Secondary | ICD-10-CM | POA: Diagnosis not present

## 2021-10-23 DIAGNOSIS — G4733 Obstructive sleep apnea (adult) (pediatric): Secondary | ICD-10-CM | POA: Diagnosis not present

## 2021-10-23 DIAGNOSIS — R051 Acute cough: Secondary | ICD-10-CM | POA: Diagnosis not present

## 2021-10-23 DIAGNOSIS — H5712 Ocular pain, left eye: Secondary | ICD-10-CM | POA: Diagnosis not present

## 2021-10-23 DIAGNOSIS — J441 Chronic obstructive pulmonary disease with (acute) exacerbation: Secondary | ICD-10-CM | POA: Diagnosis not present

## 2021-10-23 LAB — CBC
HCT: 39.2 % (ref 36.0–46.0)
Hemoglobin: 12.7 g/dL (ref 12.0–15.0)
MCH: 24.1 pg — ABNORMAL LOW (ref 26.0–34.0)
MCHC: 32.4 g/dL (ref 30.0–36.0)
MCV: 74.5 fL — ABNORMAL LOW (ref 80.0–100.0)
Platelets: 241 10*3/uL (ref 150–400)
RBC: 5.26 MIL/uL — ABNORMAL HIGH (ref 3.87–5.11)
RDW: 15 % (ref 11.5–15.5)
WBC: 10.6 10*3/uL — ABNORMAL HIGH (ref 4.0–10.5)
nRBC: 0 % (ref 0.0–0.2)

## 2021-10-23 LAB — BASIC METABOLIC PANEL
Anion gap: 8 (ref 5–15)
BUN: 16 mg/dL (ref 8–23)
CO2: 27 mmol/L (ref 22–32)
Calcium: 9.6 mg/dL (ref 8.9–10.3)
Chloride: 103 mmol/L (ref 98–111)
Creatinine, Ser: 0.85 mg/dL (ref 0.44–1.00)
GFR, Estimated: >60 mL/min (ref >60)
Glucose, Bld: 123 mg/dL — ABNORMAL HIGH (ref 70–99)
Potassium: 4.7 mmol/L (ref 3.5–5.1)
Sodium: 138 mmol/L (ref 135–145)

## 2021-10-23 LAB — MAGNESIUM: Magnesium: 2.2 mg/dL (ref 1.7–2.4)

## 2021-10-23 MED ORDER — IPRATROPIUM-ALBUTEROL 0.5-2.5 (3) MG/3ML IN SOLN: 3.0000 mL | Freq: Four times a day (QID) | RESPIRATORY_TRACT | 1 refills | Status: AC | PRN

## 2021-10-23 MED ORDER — DOXYCYCLINE HYCLATE 100 MG PO TABS: 100.0000 mg | ORAL_TABLET | Freq: Two times a day (BID) | ORAL | 0 refills | Status: AC

## 2021-10-23 MED ORDER — PREDNISONE 10 MG PO TABS: 40.0000 mg | ORAL_TABLET | Freq: Every day | ORAL | 0 refills | Status: AC

## 2021-10-23 NOTE — Progress Notes (Signed)
Nsg Discharge Note  Admit Date:  10/22/2021 Discharge date: 10/23/2021   Stephanie Hendrix to be D/C'd Home per MD order.  AVS completed.  Copy for chart, and copy for patient signed, and dated. Patient/caregiver able to verbalize understanding.  Discharge Medication: Allergies as of 10/23/2021       Reactions   Neomycin Rash        Medication List     TAKE these medications    albuterol 108 (90 Base) MCG/ACT inhaler Commonly known as: VENTOLIN HFA Inhale 2 puffs into the lungs every 6 (six) hours as needed for wheezing or shortness of breath.   benzonatate 100 MG capsule Commonly known as: TESSALON Take 1 capsule (100 mg total) by mouth 2 (two) times daily as needed for cough.   doxycycline 100 MG tablet Commonly known as: VIBRA-TABS Take 1 tablet (100 mg total) by mouth every 12 (twelve) hours for 6 days.   ibuprofen 800 MG tablet Commonly known as: ADVIL Take 800 mg by mouth every 8 (eight) hours as needed.   ipratropium-albuterol 0.5-2.5 (3) MG/3ML Soln Commonly known as: DUONEB Take 3 mLs by nebulization every 6 (six) hours as needed (for shortness of breath or wheezing).   predniSONE 10 MG tablet Commonly known as: DELTASONE Take 4 tablets (40 mg total) by mouth daily for 5 days.               Durable Medical Equipment  (From admission, onward)           Start     Ordered   10/23/21 0000  For home use only DME Nebulizer machine       Question Answer Comment  Patient needs a nebulizer to treat with the following condition COPD with acute exacerbation (HCC)   Length of Need Lifetime      10/23/21 0936            Discharge Assessment: Vitals:   10/23/21 0714 10/23/21 0720  BP:    Pulse:    Resp:    Temp:    SpO2: 93% 93%   Skin clean, dry and intact without evidence of skin break down, no evidence of skin tears noted. IV catheter discontinued intact. Site without signs and symptoms of complications - no redness or edema noted at  insertion site, patient denies c/o pain - only slight tenderness at site.  Dressing with slight pressure applied.  D/c Instructions-Education: Discharge instructions given to patient/family with verbalized understanding. D/c education completed with patient/family including follow up instructions, medication list, d/c activities limitations if indicated, with other d/c instructions as indicated by MD - patient able to verbalize understanding, all questions fully answered. Patient instructed to return to ED, call 911, or call MD for any changes in condition.  Patient escorted via WC, and D/C home via private auto.  Andria Rhein, RN 10/23/2021 10:23 AM

## 2021-10-23 NOTE — TOC Transition Note (Signed)
Transition of Care [facility]) - CM/SW Discharge Note   Patient Details  Name: ARWA YERO MRN: [id number] Date of Birth: 1956-08-26  Transition of Care [facility]) CM/SW Contact:  Villa Herb, LCSWA Phone Number: 10/23/2021, 10:49 AM   Clinical Narrative:    TOC updated that needs nebulizer ordered prior to D/C. CSW spoke with pt, she is agreeable to using [employer] to supply the neb. CSW spoke to [place] with [employer] who states they will send the order in and get it delivered to her home. TOC signing off.   Final next level of care: Home/Self Care Barriers to Discharge: No Barriers Identified   Patient Goals and CMS Choice Patient states their goals for this hospitalization and ongoing recovery are:: Return home CMS Medicare.gov Compare Post Acute Care list provided to:: Patient Choice offered to / list presented to : Patient  Discharge Placement                       Discharge Plan and Services                DME Arranged: Nebulizer/meds DME Agency: [facility] Date DME Agency Contacted: 10/23/21   Representative spoke with at DME Agency: Elease Hashimoto            Social Determinants of Health (SDOH) Interventions     Readmission Risk Interventions No flowsheet data found.

## 2021-10-23 NOTE — Discharge Summary (Signed)
Physician Discharge Summary  Stephanie Hendrix RUE:[id number] DOB: November 07, 1955 DOA: 10/22/2021  PCP: Heather Roberts, NP  Admit date: 10/22/2021  Discharge date: 10/23/2021  Admitted From:Home  Disposition:  Home  Recommendations for Outpatient Follow-up:  Follow up with PCP in 1-2 weeks Follow-up with pulmonology as scheduled in the next 1-2 weeks for COPD Continue prednisone as prescribed for 5 more days DuoNebs every 6 hours as needed for shortness of breath or wheezing Counseled on smoking cessation  Home Health: None  Equipment/Devices: None  Discharge Condition:Stable  CODE STATUS: Full  Diet recommendation: Heart Healthy  Brief/Interim Summary:  Stephanie Hendrix is a 66 y.o. female with medical history significant for chronic tobacco abuse and suspected COPD who presented to the ED with cough and shortness of breath for the last 2 weeks.  She saw her PCP last Thursday and was prescribed an inhaler and was noted to have a negative COVID test.  She was admitted with acute hypoxemic respiratory failure secondary to COPD exacerbation with some bronchitis.  She was only requiring 2 L nasal cannula oxygen and was quickly weaned off.  She was started on doxycycline as well as inhaled and systemic steroids as well as breathing treatments with significant improvement the following day.  She states that she will work on smoking cessation and agrees to follow-up with pulmonology outpatient.  No other acute events noted throughout the course of this brief stay.  She has no home oxygen requirement on discharge.  Discharge Diagnoses:  Principal Problem:   COPD with acute exacerbation (HCC)  Principal discharge diagnosis: Acute hypoxemic respiratory failure secondary to COPD exacerbation with bronchitis.  Ongoing tobacco abuse.  Discharge Instructions  Discharge Instructions     Ambulatory referral to Pulmonology   Complete by: As directed    Reason for referral: Asthma/COPD   Diet - low  sodium heart healthy   Complete by: As directed    For home use only DME Nebulizer machine   Complete by: As directed    Patient needs a nebulizer to treat with the following condition: COPD with acute exacerbation (HCC)   Length of Need: Lifetime   Increase activity slowly   Complete by: As directed       Allergies as of 10/23/2021       Reactions   Neomycin Rash        Medication List     TAKE these medications    albuterol 108 (90 Base) MCG/ACT inhaler Commonly known as: VENTOLIN HFA Inhale 2 puffs into the lungs every 6 (six) hours as needed for wheezing or shortness of breath.   benzonatate 100 MG capsule Commonly known as: TESSALON Take 1 capsule (100 mg total) by mouth 2 (two) times daily as needed for cough.   doxycycline 100 MG tablet Commonly known as: VIBRA-TABS Take 1 tablet (100 mg total) by mouth every 12 (twelve) hours for 6 days.   ibuprofen 800 MG tablet Commonly known as: ADVIL Take 800 mg by mouth every 8 (eight) hours as needed.   ipratropium-albuterol 0.5-2.5 (3) MG/3ML Soln Commonly known as: DUONEB Take 3 mLs by nebulization every 6 (six) hours as needed (for shortness of breath or wheezing).   predniSONE 10 MG tablet Commonly known as: DELTASONE Take 4 tablets (40 mg total) by mouth daily for 5 days.               Durable Medical Equipment  (From admission, onward)  Start     Ordered   10/23/21 0000  For home use only DME Nebulizer machine       Question Answer Comment  Patient needs a nebulizer to treat with the following condition COPD with acute exacerbation (HCC)   Length of Need Lifetime      10/23/21 0936            Follow-up Information     Heather RobertsGray, Joseph M, NP. Schedule an appointment as soon as possible for a visit in 1 week(s).   Specialty: Nurse Practitioner Contact information: [address]  [address] [place] [address] [phone]         [facility]. Schedule an  appointment as soon as possible for a visit in 1 week(s).   Specialty: Pulmonology Contact information: [address] [place] [phone]               Allergies  Allergen Reactions   Neomycin Rash    Consultations: None   Procedures/Studies: DG Chest Port 1 View  Result Date: 10/22/2021 CLINICAL DATA:  66 year old female with history of shortness of breath. Wheezing. EXAM: PORTABLE CHEST 1 VIEW COMPARISON:  Chest x-ray 12/20/2016. FINDINGS: Diffuse peribronchial cuffing. Lung volumes are normal. No consolidative airspace disease. No pleural effusions. No pneumothorax. Prominent nodules projecting over the mid to lower lungs bilaterally, strongly favored to represent nipple shadows. No other pulmonary nodule or mass noted. Pulmonary vasculature and the cardiomediastinal silhouette are within normal limits. Atherosclerosis in the thoracic aorta. IMPRESSION: 1. Severe diffuse peribronchial cuffing, concerning for an acute bronchitis. 2. Aortic atherosclerosis. 3. Prominent nodules in the mid to lower lungs bilaterally, favored to represent nipple shadows. Future chest radiograph should be obtained with bilateral nipple markers in place. Electronically Signed   By: Trudie Reedaniel  Entrikin M.D.   On: 10/22/2021 07:59     Discharge Exam: Vitals:   10/23/21 0714 10/23/21 0720  BP:    Pulse:    Resp:    Temp:    SpO2: 93% 93%   Vitals:   10/23/21 0125 10/23/21 0523 10/23/21 0714 10/23/21 0720  BP: 102/72 110/67    Pulse: (!) 55 (!) 53    Resp: 18 18    Temp: 98.4 F (36.9 C) 98.5 F (36.9 C)    TempSrc:      SpO2: 95% 100% 93% 93%  Weight:      Height:        General: Pt is alert, awake, not in acute distress Cardiovascular: RRR, S1/S2 +, no rubs, no gallops Respiratory: CTA bilaterally, no wheezing, no rhonchi Abdominal: Soft, NT, ND, bowel sounds + Extremities: no edema, no cyanosis    The results of significant  diagnostics from this hospitalization (including imaging, microbiology, ancillary and laboratory) are listed below for reference.     Microbiology: Recent Results (from the past 240 hour(s))  Novel Coronavirus, NAA ([facility])     Status: None   Collection Time: 10/19/21 10:54 AM   Specimen: Nasopharyngeal(NP) swabs in vial transport medium  Result Value Ref Range Status   SARS-CoV-2, NAA Not Detected Not Detected Final    Comment: This nucleic acid amplification test was developed and its performance characteristics determined by [facility]. Nucleic acid amplification tests include RT-PCR and TMA. This test has not been FDA cleared or approved. This test has been authorized by FDA under an Emergency Use Authorization (EUA). This test is only authorized for the duration of time the declaration  that circumstances exist justifying the authorization of the emergency use of in vitro diagnostic tests for detection of SARS-CoV-2 virus and/or diagnosis of COVID-19 infection under section 564(b)(1) of the Act, 21 U.S.C. 161WRU-0(A) (1), unless the authorization is terminated or revoked sooner. When diagnostic testing is negative, the possibility of a false negative result should be considered in the context of a patient's recent exposures and the presence of clinical signs and symptoms consistent with COVID-19. An individual without symptoms of COVID-19 and who is not shedding SARS-CoV-2 virus wo uld expect to have a negative (not detected) result in this assay.   SARS-COV-2, NAA 2 DAY TAT     Status: None   Collection Time: 10/19/21 10:54 AM  Result Value Ref Range Status   SARS-CoV-2, NAA 2 DAY TAT Performed  Final  Resp Panel by RT-PCR (Flu A&B, Covid) Nasopharyngeal Swab     Status: None   Collection Time: 10/22/21  7:33 AM   Specimen: Nasopharyngeal Swab; Nasopharyngeal(NP) swabs in vial transport medium  Result Value Ref Range Status   SARS Coronavirus 2 by RT PCR NEGATIVE  NEGATIVE Final    Comment: (NOTE) SARS-CoV-2 target nucleic acids are NOT DETECTED.  The SARS-CoV-2 RNA is generally detectable in upper respiratory specimens during the acute phase of infection. The lowest concentration of SARS-CoV-2 viral copies this assay can detect is 138 copies/mL. A negative result does not preclude SARS-Cov-2 infection and should not be used as the sole basis for treatment or other patient management decisions. A negative result may occur with  improper specimen collection/handling, submission of specimen other than nasopharyngeal swab, presence of viral mutation(s) within the areas targeted by this assay, and inadequate number of viral copies(<138 copies/mL). A negative result must be combined with clinical observations, patient history, and epidemiological information. The expected result is Negative.  Fact Sheet for Patients:  BloggerCourse.com  Fact Sheet for Healthcare Providers:  SeriousBroker.it  This test is no t yet approved or cleared by the [place] FDA and  has been authorized for detection and/or diagnosis of SARS-CoV-2 by FDA under an Emergency Use Authorization (EUA). This EUA will remain  in effect (meaning this test can be used) for the duration of the COVID-19 declaration under Section 564(b)(1) of the Act, 21 U.S.C.section 360bbb-3(b)(1), unless the authorization is terminated  or revoked sooner.       Influenza A by PCR NEGATIVE NEGATIVE Final   Influenza B by PCR NEGATIVE NEGATIVE Final    Comment: (NOTE) The Xpert Xpress SARS-CoV-2/FLU/RSV plus assay is intended as an aid in the diagnosis of influenza from Nasopharyngeal swab specimens and should not be used as a sole basis for treatment. Nasal washings and aspirates are unacceptable for Xpert Xpress SARS-CoV-2/FLU/RSV testing.  Fact Sheet for Patients: BloggerCourse.com  Fact Sheet for Healthcare  Providers: SeriousBroker.it  This test is not yet approved or cleared by the [place] FDA and has been authorized for detection and/or diagnosis of SARS-CoV-2 by FDA under an Emergency Use Authorization (EUA). This EUA will remain in effect (meaning this test can be used) for the duration of the COVID-19 declaration under Section 564(b)(1) of the Act, 21 U.S.C. section 360bbb-3(b)(1), unless the authorization is terminated or revoked.  Performed at [facility], [address], [place], [place] [address]      Labs: BNP (last 3 results) No results for input(s): BNP in the last 8760 hours. Basic Metabolic Panel: Recent Labs  Lab 10/22/21 0752 10/22/21 1202 10/23/21 0556  NA 134*  --  138  K 3.7  --  4.7  CL 106  --  103  CO2 21*  --  27  GLUCOSE 117*  --  123*  BUN 9  --  16  CREATININE 0.79 0.80 0.85  CALCIUM 8.4*  --  9.6  MG  --   --  2.2   Liver Function Tests: No results for input(s): AST, ALT, ALKPHOS, BILITOT, PROT, ALBUMIN in the last 168 hours. No results for input(s): LIPASE, AMYLASE in the last 168 hours. No results for input(s): AMMONIA in the last 168 hours. CBC: Recent Labs  Lab 10/22/21 0752 10/22/21 1202 10/23/21 0556  WBC 9.5 6.3 10.6*  HGB 12.8 13.6 12.7  HCT 43.2 42.6 39.2  MCV 77.8* 76.5* 74.5*  PLT 212 235 241   Cardiac Enzymes: No results for input(s): CKTOTAL, CKMB, CKMBINDEX, TROPONINI in the last 168 hours. BNP: Invalid input(s): POCBNP CBG: No results for input(s): GLUCAP in the last 168 hours. D-Dimer No results for input(s): DDIMER in the last 72 hours. Hgb A1c No results for input(s): HGBA1C in the last 72 hours. Lipid Profile No results for input(s): CHOL, HDL, LDLCALC, TRIG, CHOLHDL, LDLDIRECT in the last 72 hours. Thyroid function studies No results for input(s): TSH, T4TOTAL, T3FREE, THYROIDAB in the last 72 hours.  Invalid input(s): FREET3 Anemia work up No results for input(s): VITAMINB12,  FOLATE, FERRITIN, TIBC, IRON, RETICCTPCT in the last 72 hours. Urinalysis    Component Value Date/Time   COLORURINE STRAW (A) 08/24/2018 1840   APPEARANCEUR CLEAR 08/24/2018 1840   LABSPEC 1.036 (H) 08/24/2018 1840   PHURINE 5.0 08/24/2018 1840   GLUCOSEU NEGATIVE 08/24/2018 1840   HGBUR SMALL (A) 08/24/2018 1840   BILIRUBINUR NEGATIVE 08/24/2018 1840   KETONESUR NEGATIVE 08/24/2018 1840   PROTEINUR NEGATIVE 08/24/2018 1840   UROBILINOGEN 2.0 (H) 09/19/2012 2319   NITRITE NEGATIVE 08/24/2018 1840   LEUKOCYTESUR NEGATIVE 08/24/2018 1840   Sepsis Labs Invalid input(s): PROCALCITONIN,  WBC,  LACTICIDVEN Microbiology Recent Results (from the past 240 hour(s))  Novel Coronavirus, NAA ([facility])     Status: None   Collection Time: 10/19/21 10:54 AM   Specimen: Nasopharyngeal(NP) swabs in vial transport medium  Result Value Ref Range Status   SARS-CoV-2, NAA Not Detected Not Detected Final    Comment: This nucleic acid amplification test was developed and its performance characteristics determined by [facility]. Nucleic acid amplification tests include RT-PCR and TMA. This test has not been FDA cleared or approved. This test has been authorized by FDA under an Emergency Use Authorization (EUA). This test is only authorized for the duration of time the declaration that circumstances exist justifying the authorization of the emergency use of in vitro diagnostic tests for detection of SARS-CoV-2 virus and/or diagnosis of COVID-19 infection under section 564(b)(1) of the Act, 21 U.S.C. 161WRU-0(A) (1), unless the authorization is terminated or revoked sooner. When diagnostic testing is negative, the possibility of a false negative result should be considered in the context of a patient's recent exposures and the presence of clinical signs and symptoms consistent with COVID-19. An individual without symptoms of COVID-19 and who is not shedding SARS-CoV-2 virus wo uld expect to  have a negative (not detected) result in this assay.   SARS-COV-2, NAA 2 DAY TAT     Status: None   Collection Time: 10/19/21 10:54 AM  Result Value Ref Range Status   SARS-CoV-2, NAA 2 DAY TAT Performed  Final  Resp Panel by RT-PCR (Flu A&B, Covid) Nasopharyngeal Swab  Status: None   Collection Time: 10/22/21  7:33 AM   Specimen: Nasopharyngeal Swab; Nasopharyngeal(NP) swabs in vial transport medium  Result Value Ref Range Status   SARS Coronavirus 2 by RT PCR NEGATIVE NEGATIVE Final    Comment: (NOTE) SARS-CoV-2 target nucleic acids are NOT DETECTED.  The SARS-CoV-2 RNA is generally detectable in upper respiratory specimens during the acute phase of infection. The lowest concentration of SARS-CoV-2 viral copies this assay can detect is 138 copies/mL. A negative result does not preclude SARS-Cov-2 infection and should not be used as the sole basis for treatment or other patient management decisions. A negative result may occur with  improper specimen collection/handling, submission of specimen other than nasopharyngeal swab, presence of viral mutation(s) within the areas targeted by this assay, and inadequate number of viral copies(<138 copies/mL). A negative result must be combined with clinical observations, patient history, and epidemiological information. The expected result is Negative.  Fact Sheet for Patients:  BloggerCourse.comhttps://www.fda.gov/media/152166/download  Fact Sheet for Healthcare Providers:  SeriousBroker.ithttps://www.fda.gov/media/152162/download  This test is no t yet approved or cleared by the [place] FDA and  has been authorized for detection and/or diagnosis of SARS-CoV-2 by FDA under an Emergency Use Authorization (EUA). This EUA will remain  in effect (meaning this test can be used) for the duration of the COVID-19 declaration under Section 564(b)(1) of the Act, 21 U.S.C.section 360bbb-3(b)(1), unless the authorization is terminated  or revoked sooner.        Influenza A by PCR NEGATIVE NEGATIVE Final   Influenza B by PCR NEGATIVE NEGATIVE Final    Comment: (NOTE) The Xpert Xpress SARS-CoV-2/FLU/RSV plus assay is intended as an aid in the diagnosis of influenza from Nasopharyngeal swab specimens and should not be used as a sole basis for treatment. Nasal washings and aspirates are unacceptable for Xpert Xpress SARS-CoV-2/FLU/RSV testing.  Fact Sheet for Patients: BloggerCourse.comhttps://www.fda.gov/media/152166/download  Fact Sheet for Healthcare Providers: SeriousBroker.ithttps://www.fda.gov/media/152162/download  This test is not yet approved or cleared by the [place] FDA and has been authorized for detection and/or diagnosis of SARS-CoV-2 by FDA under an Emergency Use Authorization (EUA). This EUA will remain in effect (meaning this test can be used) for the duration of the COVID-19 declaration under Section 564(b)(1) of the Act, 21 U.S.C. section 360bbb-3(b)(1), unless the authorization is terminated or revoked.  Performed at [facility], [address] [place], [place] [address]      Time coordinating discharge: 35 minutes  SIGNED:   Erick BlinksPratik D Wylie Russon, DO [facility] 10/23/2021, 9:41 AM  If 7PM-7AM, please contact night-coverage [email or web address]

## 2021-10-24 ENCOUNTER — Ambulatory Visit: Payer: Medicare HMO

## 2021-10-25 LAB — HIV ANTIBODY (ROUTINE TESTING W REFLEX): HIV Screen 4th Generation wRfx: NONREACTIVE

## 2021-10-26 ENCOUNTER — Other Ambulatory Visit: Payer: Self-pay

## 2021-10-26 ENCOUNTER — Encounter: Payer: Self-pay | Admitting: Internal Medicine

## 2021-10-26 ENCOUNTER — Ambulatory Visit (INDEPENDENT_AMBULATORY_CARE_PROVIDER_SITE_OTHER): Payer: Medicare HMO | Admitting: Internal Medicine

## 2021-10-26 DIAGNOSIS — Z87891 Personal history of nicotine dependence: Secondary | ICD-10-CM | POA: Diagnosis not present

## 2021-10-26 DIAGNOSIS — J449 Chronic obstructive pulmonary disease, unspecified: Secondary | ICD-10-CM

## 2021-10-26 DIAGNOSIS — F17210 Nicotine dependence, cigarettes, uncomplicated: Secondary | ICD-10-CM | POA: Insufficient documentation

## 2021-10-26 MED ORDER — BREZTRI AEROSPHERE 160-9-4.8 MCG/ACT IN AERO: 2.0000 | INHALATION_SPRAY | Freq: Two times a day (BID) | RESPIRATORY_TRACT | 0 refills | Status: DC

## 2021-10-26 MED ORDER — BREZTRI AEROSPHERE 160-9-4.8 MCG/ACT IN AERO: INHALATION_SPRAY | RESPIRATORY_TRACT | 11 refills | Status: DC

## 2021-10-26 NOTE — Assessment & Plan Note (Addendum)
Quit 08/2012  - congratulated and reinforced.  - referred for lung cancer screeing 10/26/2021   Low-dose CT lung cancer screening is recommended for patients who are 64-68 years of age with a 20+ pack-year history of smoking, and who are currently smoking or quit <=15 years ago. No coughing up blood  No unintentional weight loss of > 15 pounds in the last 6 months  >>> she is eligible for the next 2 y   Discussed in detail all the  indications, usual  risks and alternatives  relative to the benefits with patient who agrees to proceed with w/u as outlined.            Each maintenance medication was reviewed in detail including emphasizing most importantly the difference between maintenance and prns and under what circumstances the prns are to be triggered using an action plan format where appropriate.  Total time for H and P, chart review, counseling, reviewing hfa device(s) and generating customized AVS unique to this new pt  office visit / same day charting = 45 min

## 2021-10-26 NOTE — Progress Notes (Signed)
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 [facility]    Admit date: 10/22/2021   Discharge date: 10/23/2021   Admitted From:Home   Disposition:  Home   Recommendations for Outpatient Follow-up:  Follow up with PCP in 1-2 weeks Follow-up with pulmonology as scheduled in the next 1-2 weeks for COPD Continue prednisone as prescribed for 5 more days DuoNebs every 6 hours as needed for shortness of breath or wheezing Counseled on smoking cessation   Brief/Interim Summary:  Stephanie Hendrix is a 66 y.o. female with medical history significant for chronic tobacco abuse and suspected COPD who presented to the ED with cough and shortness of breath for the last 2 weeks.  She saw her PCP last Thursday and was prescribed an inhaler and was noted to have a negative COVID test.  She was admitted with acute hypoxemic respiratory failure secondary to COPD exacerbation with some bronchitis.  She was only requiring 2 L nasal cannula oxygen and was quickly weaned off.  She was started on doxycycline as well as inhaled and systemic steroids as well as breathing treatments with significant improvement the following day.  She states that she will work on smoking cessation and agrees to follow-up with pulmonology outpatient.  No other acute events noted throughout the course of this brief stay.  She has no home oxygen requirement on discharge.   Discharge Diagnoses:  Principal Problem:   COPD with acute exacerbation ([place])   Principal discharge diagnosis: Acute hypoxemic respiratory failure secondary to COPD exacerbation with bronchitis.  Ongoing tobacco abuse.   History of Present Illness  10/26/2021  Pulmonary/ 1st office eval/ [facility] Nov[facility] / [facility]  Chief Complaint  Patient presents with   [place] admission from   10/22/2021-10/23/2021.   Dyspnea:  baseline = no problem walking including hills an hour at a time  then around mid Jan 2013 cough/ chest congestion/ wheeze > gradually worse x 2 weeks and  admit and back to ok with adls since d/c but not doing much walking yet.  Cough: none now  Sleep: ok flat bed  SABA use: 2puffs of albuterol this am / has neb not used today   No obvious day to day or daytime variability or assoc excess/ purulent sputum or mucus plugs or hemoptysis or cp or chest tightness, subjective wheeze or overt sinus or hb symptoms.   Sleeping now  without nocturnal  or early am exacerbation  of respiratory  c/o's or need for noct saba. Also denies any obvious fluctuation of symptoms with weather or environmental changes or other aggravating or alleviating factors except as outlined above   No unusual exposure hx or h/o childhood pna/ asthma or knowledge of premature birth.  Current Allergies, Complete Past Medical History, Past Surgical History, Family History, and Social History were reviewed in [employer] record.  ROS  The following are not active complaints unless bolded Hoarseness, sore throat, dysphagia, dental problems, itching, sneezing,  nasal congestion or discharge of excess mucus or purulent secretions, ear ache,   fever, chills, sweats, unintended wt loss or wt gain, classically pleuritic or exertional cp,  orthopnea pnd or arm/hand swelling  or leg swelling, presyncope, palpitations, abdominal pain, anorexia, nausea, vomiting, diarrhea  or change in bowel habits or  change in bladder habits, change in stools or change in urine, dysuria, hematuria,  rash, arthralgias, visual complaints, headache, numbness, weakness or ataxia or problems with walking or coordination,  change in mood or  memory.             Past Medical History:  Diagnosis Date   Allergy    Syncope    Tobacco abuse 12/22/2016    Outpatient Medications Prior to Visit  Medication Sig  Dispense Refill   albuterol (VENTOLIN HFA) 108 (90 Base) MCG/ACT inhaler Inhale 2 puffs into the lungs every 6 (six) hours as needed for wheezing or shortness of breath. 8 g 0   benzonatate (TESSALON) 100 MG capsule Take 1 capsule (100 mg total) by mouth 2 (two) times daily as needed for cough. 20 capsule 0   doxycycline (VIBRA-TABS) 100 MG tablet Take 1 tablet (100 mg total) by mouth every 12 (twelve) hours for 6 days. 12 tablet 0   ibuprofen (ADVIL) 800 MG tablet Take 800 mg by mouth every 8 (eight) hours as needed.     ipratropium-albuterol (DUONEB) 0.5-2.5 (3) MG/3ML SOLN Take 3 mLs by nebulization every 6 (six) hours as needed (for shortness of breath or wheezing). 360 mL 1   predniSONE (DELTASONE) 10 MG tablet Take 4 tablets (40 mg total) by mouth daily for 5 days. 20 tablet 0   No facility-administered medications prior to visit.     Objective:     BP 130/68 (BP Location: Left Arm, Patient Position: Sitting)    Pulse 60    Temp 98.4 F (36.9 C) (Temporal)    Ht 5' 6.5" (1.689 m)    Wt 126 lb 1.9 oz (57.2 kg)    SpO2 98% Comment: ra   BMI 20.05 kg/m   SpO2: 98 % (ra)   HEENT : pt wearing mask not removed for exam due to covid - 19 concerns.    NECK :  without JVD/Nodes/TM/ nl carotid upstrokes bilaterally   LUNGS: no acc muscle use,  Mild barrel  contour chest wall with bilateral  exp rhonchi s audible wheeze and  without cough on insp or exp maneuvers  and mild  Hyperresonant  to  percussion bilaterally     CV:  RRR  no s3 or murmur or increase in P2, and no edema   ABD:  soft and nontender with pos end  insp Hoover's  in the supine position. No bruits or organomegaly appreciated, bowel sounds nl  MS:   Nl gait/  ext warm without deformities, calf tenderness, cyanosis or clubbing No obvious joint restrictions   SKIN: warm and dry without lesions    NEURO:  alert, approp, nl sensorium with  no motor or cerebellar deficits apparent.           I personally reviewed  images and agree with radiology impression as follows:  CXR:   portable 10/22/21 1. Severe diffuse peribronchial cuffing, concerning for an acute bronchitis. 2. Aortic atherosclerosis. 3. Prominent nodules in the mid to lower lungs bilaterally, favored to represent nipple shadows.   Assessment   Asthmatic bronchitis , chronic ([place]) Quit smoking 2023  - 10/26/2021  After extensive coaching inhaler device,  effectiveness =    75% (short Ti) so rx breztri 2 bid    Group D in terms of symptom/risk and laba/lama/ICS  therefore appropriate rx at this point >>>  That is more of an AB picture than copd/ emphysema so return in 3 m p trial of breztri  and no smoking and see where her new baseline is with PFTs on return.       Former smoker Quit 08/2012  - congratulated and reinforced.  - referred for lung cancer screeing 10/26/2021   Low-dose CT lung cancer screening is recommended for patients who are 62-28 years of age with a 20+ pack-year history of smoking, and who are currently smoking or quit <=15 years ago. No coughing up blood  No unintentional weight loss of > 15 pounds in the last 6 months  >>> she is eligible for the next 25 y   Discussed in detail all the  indications, usual  risks and alternatives  relative to the benefits with patient who agrees to proceed with w/u as outlined.            Each maintenance medication was reviewed in detail including emphasizing most importantly the difference between maintenance and prns and under what circumstances the prns are to be triggered using an action plan format where appropriate.  Total time for H and P, chart review, counseling, reviewing hfa device(s) and generating customized AVS unique to this new pt  office visit / same day charting = 45 min        Christinia Gully, MD 10/26/2021

## 2021-10-26 NOTE — Assessment & Plan Note (Signed)
Quit smoking 2023  - 10/26/2021  After extensive coaching inhaler device,  effectiveness =    75% (short Ti) so rx breztri 2 bid    Group D in terms of symptom/risk and laba/lama/ICS  therefore appropriate rx at this point >>>  That is more of an AB picture than copd/ emphysema so return in 3 m p trial of breztri and no smoking and see where her new baseline is with PFTs on return.

## 2021-10-26 NOTE — Patient Instructions (Addendum)
Plan A = Automatic = Always=    Breztri up to 2 pffs every 12 hours   Work on inhaler technique:  relax and gently blow all the way out then take a nice smooth full deep breath back in, triggering the inhaler at same time you start breathing in.  Hold for up to 5 seconds if you can. Blow out thru nose. Rinse and gargle with water when done.  If mouth or throat bother you at all,  try brushing teeth/gums/tongue with arm and hammer toothpaste/ make a slurry and gargle and spit out.       Plan B = Backup (to supplement plan A, not to replace it) Only use your albuterol inhaler as a rescue medication to be used if you can't catch your breath by resting or doing a relaxed purse lip breathing pattern.  - The less you use it, the better it will work when you need it. - Ok to use the inhaler up to 2 puffs  twice daily but if needing it more you must start the [facility] [place]   Plan C = Crisis (instead of Plan B but only if Plan B stops working) - only use your albuterol nebulizer if you first try Plan B and it fails to help > ok to use the nebulizer up to every 4 hours but if start needing it regularly call for immediate appointment  Congratulations on stopping smoking - most important aspect of your care    Please schedule a follow up visit in 3 months but call sooner if needed

## 2021-11-01 ENCOUNTER — Ambulatory Visit (INDEPENDENT_AMBULATORY_CARE_PROVIDER_SITE_OTHER): Payer: Medicare HMO | Admitting: Nurse Practitioner

## 2021-11-01 ENCOUNTER — Encounter: Payer: Self-pay | Admitting: Nurse Practitioner

## 2021-11-01 ENCOUNTER — Other Ambulatory Visit: Payer: Self-pay

## 2021-11-01 VITALS — BP 108/70 | HR 63 | Ht 66.0 in | Wt 127.1 lb

## 2021-11-01 DIAGNOSIS — Z139 Encounter for screening, unspecified: Secondary | ICD-10-CM

## 2021-11-01 DIAGNOSIS — J449 Chronic obstructive pulmonary disease, unspecified: Secondary | ICD-10-CM | POA: Diagnosis not present

## 2021-11-01 DIAGNOSIS — Z09 Encounter for follow-up examination after completed treatment for conditions other than malignant neoplasm: Secondary | ICD-10-CM | POA: Diagnosis not present

## 2021-11-01 NOTE — Progress Notes (Signed)
° °  HATSUYE KNICKREHM     MRN: [id number]      DOB: 1956-03-12   HPI Ms. Wohlwend is here for follow up for hospital admission for COPD exacerbation on 10/22/21.Marland Kitchen She has finished her Prednisone , doxycycline . She has had to use her albuterol inhaler only twice in a week .  Pt stated that her breathing is fine, no more sob, wheezing, she quit smoking 10 days ago. She has smoked for 54 years, smoked between 2 packs to 22.5 packs a day.   She has seen pulmonology last week. She is scheduled for low dose CT scan of the lung.  She has received the letter for her to get colonoscopy done. Has dental appointment coming up.   ROS Denies recent fever or chills. Denies sinus pressure, nasal congestion, ear pain or sore throat. Denies chest congestion, productive cough or wheezing. Denies chest pains, palpitations and leg swelling Denies abdominal pain, nausea, vomiting,diarrhea or constipation.   Denies dysuria, frequency, hesitancy or incontinence. Denies joint pain, swelling and limitation in mobility. Denies headaches, seizures, numbness, or tingling. Denies depression, anxiety or insomnia. Denies skin break down or rash.   PE  BP 108/70 (BP Location: Left Arm, Patient Position: Sitting, Cuff Size: Normal)    Pulse 63    Ht 5\' 6"  (1.676 m)    Wt 127 lb 1.3 oz (57.6 kg)    SpO2 99%    BMI 20.51 kg/m   Patient alert and oriented and in no cardiopulmonary distress.  Chest: Clear to auscultation bilaterally.  CVS: S1, S2 no murmurs, no S3.Regular rate.  ABD: Soft non tender.   Ext: No edema  MS: Adequate ROM spine, shoulders, hips and knees.  Skin: Intact, no ulcerations or rash noted.  Psych: Good eye contact, normal affect. Memory intact not anxious or depressed appearing.  CNS: CN 2-12 intact, power,  normal throughout.no focal deficits noted.   Assessment & Plan

## 2021-11-01 NOTE — Assessment & Plan Note (Addendum)
Was admitted in the hospital for COPD exacerbation on 10/22/21. She has finished her Prednisone , doxycycline . She has had to use her albuterol inhaler only twice in a week .  Pt stated that her breathing is fine, no more sob, wheezing, she quit smoking 10 days ago. She has smoked for 54 years, smoked between 2 packs to 22.5 packs a day.   She has seen pulmonology last week. She is scheduled for low dose CT scan of the lung.  She has received the letter for her to get colonoscopy done. Has dental appointment coming up.

## 2021-11-01 NOTE — Assessment & Plan Note (Signed)
Pt has been taking her medications as prescribed. She has quit smoking 10 days ago. Pt educated on the need to not go back to smoking , she verbalized understanding. Pt encouraged to keep all scheduled appointments.

## 2021-11-01 NOTE — Patient Instructions (Addendum)
Please get your shingles vaccine, Covid booster, pneumonia vaccine and TDAP vaccine at your pharmacy.  Pleas get your lab work done 3-5 days before your next visit/  It is important that you exercise regularly at least 30 minutes 5 times a week.  Think about what you will eat, plan ahead. Choose " clean, green, fresh or frozen" over canned, processed or packaged foods which are more sugary, salty and fatty. 70 to 75% of food eaten should be vegetables and fruit. Three meals at set times with snacks allowed between meals, but they must be fruit or vegetables. Aim to eat over a 12 hour period , example 7 am to 7 pm, and STOP after  your last meal of the day. Drink water,generally about 64 ounces per day, no other drink is as healthy. Fruit juice is best enjoyed in a healthy way, by EATING the fruit.  Thanks for choosing [facility], we consider it a privelige to serve you.

## 2021-11-09 ENCOUNTER — Telehealth: Payer: Self-pay

## 2021-11-09 NOTE — Telephone Encounter (Signed)
° °  Telephone encounter was:  Successful.  11/09/2021 Name: Stephanie Hendrix MRN: [id number] DOB: [birth year]  Stephanie Hendrix is a 66 y.o. year old female who is a primary care patient of Donell Beers, FNP . The community resource team was consulted for assistance with Home Modifications  Care guide performed the following interventions: Follow up call placed to the patient to discuss status of referral.  Follow Up Plan:  Care guide will follow up with patient by phone over the next few days with resources  [facility] Guide, Embedded Care Coordination [facility]  [place], [place] [address]  Main Phone: [phone]   E-mail: [name]  Website: [email or web address]

## 2021-11-15 ENCOUNTER — Telehealth: Payer: Self-pay

## 2021-11-15 NOTE — Telephone Encounter (Signed)
° °  Telephone encounter was:  Successful.  11/15/2021 Name: Stephanie Hendrix MRN: [id number] DOB: 10-24-1955  Stephanie Hendrix is a 66 y.o. year old female who is a primary care patient of Donell Beers, FNP . The community resource team was consulted for assistance with Home Modifications, Financial Difficulties related to bills, and Landlord-Tenant dispute  Care guide performed the following interventions:  Sent pt needed resources: [place] [facility] Education officer, museum and Programs avaiable, Home Rehabilitation Program, Educational psychologist, Holiday representative Information, Dietitian Dispute Form, & Single Family Housing Public relations account executive and Merchandiser, retail.  Follow Up Plan:  Care guide will follow up with patient by phone over the next few days regarding mail.  [facility] Guide, Embedded Care Coordination [facility]  [place], [place] [address]  Main Phone: [phone]   E-mail: [email or web address]  Website: [email or web address]

## 2021-11-24 ENCOUNTER — Ambulatory Visit (HOSPITAL_COMMUNITY)
Admission: RE | Admit: 2021-11-24 | Discharge: 2021-11-24 | Disposition: A | Discharge status: Home / Self Care | Payer: Medicare HMO | Source: Ambulatory Visit | Attending: Internal Medicine | Admitting: Internal Medicine

## 2021-11-24 ENCOUNTER — Other Ambulatory Visit: Payer: Self-pay

## 2021-11-24 DIAGNOSIS — Z122 Encounter for screening for malignant neoplasm of respiratory organs: Secondary | ICD-10-CM | POA: Insufficient documentation

## 2021-11-24 DIAGNOSIS — J439 Emphysema, unspecified: Secondary | ICD-10-CM | POA: Insufficient documentation

## 2021-11-24 DIAGNOSIS — I700 Atherosclerosis of aorta: Secondary | ICD-10-CM | POA: Diagnosis not present

## 2021-11-24 DIAGNOSIS — I2510 Atherosclerotic heart disease of native coronary artery without angina pectoris: Secondary | ICD-10-CM | POA: Insufficient documentation

## 2021-11-24 DIAGNOSIS — F17210 Nicotine dependence, cigarettes, uncomplicated: Secondary | ICD-10-CM | POA: Insufficient documentation

## 2021-11-24 DIAGNOSIS — R69 Illness, unspecified: Secondary | ICD-10-CM | POA: Diagnosis not present

## 2021-11-29 ENCOUNTER — Telehealth: Payer: Self-pay

## 2021-11-29 NOTE — Telephone Encounter (Signed)
? ?  Telephone encounter was:  Successful.  ?11/29/2021 ?Name: Stephanie Hendrix MRN: [id number] DOB: Nov 18, 1955 ? ?Stephanie Hendrix is a 66 y.o. year old female who is a primary care patient of Donell Beers, FNP . The community resource team was consulted for assistance with Home Modifications, Financial Difficulties related to bills, and Landlord-Tenant Dispute ? ?Care guide performed the following interventions:  Pt advised mail has not been received yet.I am checking with Admin to see if I need to resend or wait til Friday to see if pt receives it. ? ?Follow Up Plan:  Care guide will follow up with patient by phone over the next few days. ? ?[name] ?Care Guide, Embedded Care Coordination ?[facility] Health  Care management  ?[place], [place] [address]  ?Main Phone: [phone]  E-mail: [email or web address]  ?Website: [email or web address] ? ? ? ? ?

## 2021-11-29 NOTE — Progress Notes (Signed)
Noted, thank you Dr Allena Katz.

## 2021-11-29 NOTE — Progress Notes (Signed)
Please review result with pt. Lung cancer screening was negative for malignancy. Pt has atherosclerosis, we will discus need for statin at next visit. Thank you

## 2021-11-30 ENCOUNTER — Telehealth: Payer: Self-pay

## 2021-11-30 NOTE — Telephone Encounter (Signed)
? ?  Telephone encounter was:  Unsuccessful.  11/30/2021 ?Name: Stephanie Hendrix MRN: [id number] DOB: 02-28-1956 ? ?Unsuccessful outbound call made today to assist with:  Home Modifications, Financial Difficulties related to Bills, and Landlord-Tenant Dispute ? ?Outreach Attempt:  1st Attempt ? ?Unable to leave message as mailbox is full. Update from [place] advised to wait til Friday before resending mail out. ? ?Cameron Proud ?Care Guide, Embedded Care Coordination ?[facility] Health  Care management  ?[place], [place]  ?Main Phone: [phone]  E-mail: [email or web address]  ?Website: [email or web address] ? ? ? ?

## 2021-12-02 ENCOUNTER — Telehealth: Payer: Self-pay

## 2021-12-02 NOTE — Telephone Encounter (Signed)
? ?  Telephone encounter was:  Unsuccessful.  12/02/2021 ?Name: Stephanie Hendrix MRN: [id number] DOB: Oct 08, 1955 ? ?Unsuccessful outbound call made today to assist with:  Home Modifications, Financial Difficulties related to bills, and landlord-tenant dispute ? ?Outreach Attempt:  2nd Attempt ? ?Unable to leave voicemail on pt's phone. ? ?[name] ?Care Guide, Embedded Care Coordination ?[facility] Health  Care management  ?[place], [place] [address]  ?Main Phone: [phone]  E-mail: [email or web address]  ?Website: [email or web address] ? ? ?

## 2021-12-05 ENCOUNTER — Telehealth: Payer: Self-pay

## 2021-12-05 NOTE — Telephone Encounter (Signed)
? ?  Telephone encounter was:  Unsuccessful.  12/05/2021 ?Name: KINGSLEE DOWSE MRN: [id number] DOB: January 20, 1956 ? ?Unsuccessful outbound call made today to assist with:  Home Modifications and Financial Difficulties related to bills ? ?Outreach Attempt:  3rd Attempt.  Referral closed unable to contact patient. ? ?Unable to [employer] - Called twice 12:53p call rung twice and went quickly to voicemail that it not set up, so I called again to ensure call actually went threw at 12:55p, however, I still got a voicemail that was not set up. 3rd attempt. However, Care guide performed the following interventions:  Sent pt needed resources: [place] [facility] Education officer, museum and Programs avaiable, Home Rehabilitation Program, Educational psychologist, Physicist, medical, Dietitian Dispute Form, & Single Family Housing Public relations account executive and Merchandiser, retail. Mail was sent twice. I sent on 11/15/2021 and Admin re-sent on 11/30/2021. ? ?[name] ?Care Guide, Embedded Care Coordination ?[facility] Health  Care management  ?[place], [place] [address]  ?Main Phone: [phone]  E-mail: [email or web address]  ?Website: [email or web address] ?  ? ?

## 2021-12-05 NOTE — Telephone Encounter (Signed)
? ?  Telephone encounter was:  Successful.  ?12/05/2021 ?Name: Stephanie Hendrix MRN: [id number] DOB: 10-Jan-1956 ? ?Stephanie Hendrix is a 66 y.o. year old female who is a primary care patient of Donell Beers, FNP . The community resource team was consulted for assistance with Home Modifications and Financial Difficulties related to bills ? ?Care guide performed the following interventions:  Caller advised pt was still sleep and unable to confirm if mail received at this time. I advised I would call again later this afternoon. ? ?Follow Up Plan:  Care guide will follow up with patient by phone over the next few hours ? ?[name] ?Care Guide, Embedded Care Coordination ?[facility] Health  Care management  ?[place], [place] [address]  ?Main Phone: [phone]  E-mail: [email or web address]  ?Website: [email or web address] ? ? ? ?

## 2021-12-12 ENCOUNTER — Ambulatory Visit: Payer: Medicare HMO

## 2022-02-02 ENCOUNTER — Ambulatory Visit: Payer: Medicare HMO | Admitting: Internal Medicine

## 2022-02-02 NOTE — Progress Notes (Deleted)
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 [facility]    Admit date: 10/22/2021   Discharge date: 10/23/2021   Admitted From:Home   Disposition:  Home   Recommendations for Outpatient Follow-up:  Follow up with PCP in 1-2 weeks Follow-up with pulmonology as scheduled in the next 1-2 weeks for COPD Continue prednisone as prescribed for 5 more days DuoNebs every 6 hours as needed for shortness of breath or wheezing Counseled on smoking cessation   Brief/Interim Summary:  Stephanie Hendrix is a 66 y.o. female with medical history significant for chronic tobacco abuse and suspected COPD who presented to the ED with cough and shortness of breath for the last 2 weeks.  She saw her PCP last Thursday and was prescribed an inhaler and was noted to have a negative COVID test.  She was admitted with acute hypoxemic respiratory failure secondary to COPD exacerbation with some bronchitis.  She was only requiring 2 L nasal cannula oxygen and was quickly weaned off.  She was started on doxycycline as well as inhaled and systemic steroids as well as breathing treatments with significant improvement the following day.  She states that she will work on smoking cessation and agrees to follow-up with pulmonology outpatient.  No other acute events noted throughout the course of this brief stay.  She has no home oxygen requirement on discharge.   Discharge Diagnoses:  Principal Problem:   COPD with acute exacerbation (HCC)   Principal discharge diagnosis: Acute hypoxemic respiratory failure secondary to COPD exacerbation with bronchitis.  Ongoing tobacco abuse.   History of Present Illness  10/26/2021  Pulmonary/ 1st office eval/ [facility] / [facility]  Chief Complaint  Patient presents with   Consult    Hosp admission from   10/22/2021-10/23/2021.   Dyspnea:  baseline = no problem walking including hills an hour at a time  then around mid Jan 2013 cough/ chest congestion/ wheeze > gradually worse x 2 weeks and  admit and back to ok with adls since d/c but not doing much walking yet.  Cough: none now  Sleep: ok flat bed  SABA use: 2puffs of albuterol this am / has neb not used today  Rec Plan A = Automatic = Always=    Breztri up to 2 pffs every 12 hours  Work on inhaler technique:   Plan B = Backup (to supplement plan A, not to replace it) Only use your albuterol inhaler as a rescue medication Plan C = Crisis (instead of Plan B but only if Plan B stops working) - only use your albuterol nebulizer if you first try Plan B  Congratulations on stopping smoking - most important aspect of your care     02/02/2022  f/u ov/[place] office/Kourtlyn Charlet re: *** maint on ***  No chief complaint on file.   Dyspnea:  *** Cough: *** Sleeping: *** SABA use: *** 02: *** Covid status: *** Lung cancer screening: ***   No obvious day to day or daytime variability or assoc excess/ purulent sputum or mucus plugs or hemoptysis or cp or chest tightness, subjective wheeze or overt sinus or hb symptoms.   *** without nocturnal  or early am exacerbation  of respiratory  c/o's or need for noct saba. Also denies any obvious fluctuation of symptoms with weather  or environmental changes or other aggravating or alleviating factors except as outlined above   No unusual exposure hx or h/o childhood pna/ asthma or knowledge of premature birth.  Current Allergies, Complete Past Medical History, Past Surgical History, Family History, and Social History were reviewed in [employer] record.  ROS  The following are not active complaints unless bolded Hoarseness, sore throat, dysphagia, dental problems, itching, sneezing,  nasal congestion or discharge of excess mucus or purulent secretions, ear ache,   fever, chills, sweats,  unintended wt loss or wt gain, classically pleuritic or exertional cp,  orthopnea pnd or arm/hand swelling  or leg swelling, presyncope, palpitations, abdominal pain, anorexia, nausea, vomiting, diarrhea  or change in bowel habits or change in bladder habits, change in stools or change in urine, dysuria, hematuria,  rash, arthralgias, visual complaints, headache, numbness, weakness or ataxia or problems with walking or coordination,  change in mood or  memory.        No outpatient medications have been marked as taking for the 02/02/22 encounter (Appointment) with Nyoka Cowden, MD.            Objective:       Wt Readings from Last 3 Encounters:  11/01/21 127 lb 1.3 oz (57.6 kg)  10/26/21 126 lb 1.9 oz (57.2 kg)  10/22/21 135 lb (61.2 kg)      Vital signs reviewed  02/02/2022  - Note at rest 02 sats  ***% on ***   General appearance:    ***      Mild bar***            Assessment    .

## 2022-03-01 ENCOUNTER — Encounter: Payer: Self-pay | Admitting: Nurse Practitioner

## 2022-03-01 ENCOUNTER — Ambulatory Visit (INDEPENDENT_AMBULATORY_CARE_PROVIDER_SITE_OTHER): Payer: Medicare HMO | Admitting: Nurse Practitioner

## 2022-03-01 VITALS — BP 125/77 | HR 60 | Ht 66.0 in | Wt 119.0 lb

## 2022-03-01 DIAGNOSIS — Z1231 Encounter for screening mammogram for malignant neoplasm of breast: Secondary | ICD-10-CM

## 2022-03-01 DIAGNOSIS — I2510 Atherosclerotic heart disease of native coronary artery without angina pectoris: Secondary | ICD-10-CM | POA: Diagnosis not present

## 2022-03-01 DIAGNOSIS — J438 Other emphysema: Secondary | ICD-10-CM | POA: Diagnosis not present

## 2022-03-01 DIAGNOSIS — Z0000 Encounter for general adult medical examination without abnormal findings: Secondary | ICD-10-CM | POA: Insufficient documentation

## 2022-03-01 DIAGNOSIS — Z0001 Encounter for general adult medical examination with abnormal findings: Secondary | ICD-10-CM | POA: Diagnosis not present

## 2022-03-01 DIAGNOSIS — I700 Atherosclerosis of aorta: Secondary | ICD-10-CM | POA: Diagnosis not present

## 2022-03-01 DIAGNOSIS — Z1211 Encounter for screening for malignant neoplasm of colon: Secondary | ICD-10-CM

## 2022-03-01 DIAGNOSIS — J439 Emphysema, unspecified: Secondary | ICD-10-CM | POA: Insufficient documentation

## 2022-03-01 DIAGNOSIS — G4700 Insomnia, unspecified: Secondary | ICD-10-CM

## 2022-03-01 DIAGNOSIS — R69 Illness, unspecified: Secondary | ICD-10-CM | POA: Diagnosis not present

## 2022-03-01 DIAGNOSIS — R634 Abnormal weight loss: Secondary | ICD-10-CM

## 2022-03-01 DIAGNOSIS — F17200 Nicotine dependence, unspecified, uncomplicated: Secondary | ICD-10-CM

## 2022-03-01 NOTE — Assessment & Plan Note (Deleted)
condition well-controlled on Breztri inhaler Stated that she has not needed albuterol inhaler in a while Continue breztri inhaler 106-9-4.8 mcg/act.aero 2 puffs 2 times daily Patient encouraged to maintain close follow-up with pulmonology. Smoking cessation education completed

## 2022-03-01 NOTE — Assessment & Plan Note (Addendum)
Incidentally found on recent CT Current everyday smoker Not on a statin Check fasting lipid panel I discussed to starting  patient on  A statin if  appropriate when labs are back.  Smoking cessation education completed  The 10-year ASCVD risk score (Arnett DK, et al., 2019) is: 8.2%   Values used to calculate the score:     Age: 66 years     Sex: Female     Is Non-Hispanic African American: Yes     Diabetic: No     Tobacco smoker: No     Systolic Blood Pressure: 125 mmHg     Is BP treated: No     HDL Cholesterol: 68 mg/dL     Total Cholesterol: 187 mg/dL

## 2022-03-01 NOTE — Assessment & Plan Note (Signed)
Annual exam as documented.  Counseling done include healthy lifestyle involving committing to 150 minutes of exercise per week, heart healthy diet, and attaining healthy weight. The importance of adequate sleep also discussed.  Regular use of seat belt and home safety were also discussed . Changes in health habits are decided on by patient with goals and time frames set for achieving them.  Due for pneumonia vaccine, Tdap vaccine, shingles vaccine need for all vaccines discussed with patient patient encouraged to get all vaccines at her pharmacy.  Screening mammogram, Cologuard ordered today

## 2022-03-01 NOTE — Assessment & Plan Note (Signed)
Chronic condition Sleeps 4 to 5 hours at night Take OTC melatonin at bedtime as needed

## 2022-03-01 NOTE — Progress Notes (Signed)
Complete physical exam  Patient: Stephanie Hendrix   DOB: 30-Oct-1955   66 y.o. Female  MRN: [id number]  Subjective:    Chief Complaint  Patient presents with   Annual Exam    cpe    Stephanie Hendrix is a 66 y.o. female with past medical history of COPD, nicotine addiction, who presents today for a complete physical exam. She reports consuming a general diet. Does exercises by playing with her grand kids.  She generally feels fairly well. She reports sleeping poorly. She does have additional problems to discuss today.   Unintentional weight loss .  Patient stated that has a great appetite, reports eating well, denies fever, chills , trouble swallowing,night sweat, bloody stool.    Smoker was smoking 2.5 packs of cigartees daily , she has cut down to half a pack of cigarettes  daily since February. Pt denies wheezing SOB, Recent lung cancer chest CT showed few scattered solid pulmonary nodules, largest 4.2 mm in volumederived mean diameter in the right middle lobe , with recommednadtiosn for yearly screening .   Insomnia.  Chronic condition patient reports that she sleeps 4 to 5 hours nightly denies snoring, apnea she has never tried taking any medication for her condition.     Most recent fall risk assessment:    03/01/2022   11:07 AM  [place] in the past year? 0  Number falls in past yr: 0  Injury with Fall? 0  Risk for fall due to : No Fall Risks  Follow up Falls evaluation completed     Most recent depression screenings:    03/01/2022   11:07 AM 11/01/2021   11:45 AM  PHQ 2/9 Scores  PHQ - 2 Score 0 0        Patient Care Team: Renee Rival, FNP as PCP - General (Nurse Practitioner) Arnoldo Lenis, MD as PCP - Cardiology (Cardiology)   Outpatient Medications Prior to Visit  Medication Sig   Budeson-Glycopyrrol-Formoterol (BREZTRI AEROSPHERE) 160-9-4.8 MCG/ACT AERO Take 2 puffs first thing in am and then another 2 puffs about 12 hours later.    ibuprofen (ADVIL) 800 MG tablet Take 800 mg by mouth every 8 (eight) hours as needed.   albuterol (VENTOLIN HFA) 108 (90 Base) MCG/ACT inhaler Inhale 2 puffs into the lungs every 6 (six) hours as needed for wheezing or shortness of breath. (Patient not taking: Reported on 03/01/2022)   Budeson-Glycopyrrol-Formoterol (BREZTRI AEROSPHERE) 160-9-4.8 MCG/ACT AERO Inhale 2 puffs into the lungs in the morning and at bedtime. (Patient not taking: Reported on 03/01/2022)   ipratropium-albuterol (DUONEB) 0.5-2.5 (3) MG/3ML SOLN Take 3 mLs by nebulization every 6 (six) hours as needed (for shortness of breath or wheezing).   [DISCONTINUED] benzonatate (TESSALON) 100 MG capsule Take 1 capsule (100 mg total) by mouth 2 (two) times daily as needed for cough. (Patient not taking: Reported on 11/01/2021)   No facility-administered medications prior to visit.    Review of Systems  Constitutional:  Positive for weight loss. Negative for chills and fever.  HENT: Negative.  Negative for ear pain, hearing loss and tinnitus.   Eyes: Negative.  Negative for pain, discharge and redness.  Respiratory: Negative.  Negative for cough, hemoptysis and sputum production.   Cardiovascular: Negative.  Negative for chest pain, palpitations, orthopnea and claudication.  Gastrointestinal: Negative.  Negative for heartburn, nausea and vomiting.  Genitourinary: Negative.  Negative for dysuria, frequency and urgency.  Musculoskeletal: Negative.  Negative for back pain,  myalgias and neck pain.  Skin: Negative.  Negative for itching and rash.  Neurological: Negative.  Negative for dizziness, tingling and headaches.  Endo/Heme/Allergies: Negative.   Psychiatric/Behavioral: Negative.  Negative for depression, substance abuse and suicidal ideas.          Objective:     BP 125/77 (BP Location: Right Arm, Patient Position: Sitting, Cuff Size: Normal)   Pulse 60   Ht 5\' 6"  (1.676 m)   Wt 119 lb (54 kg)   SpO2 97%   BMI 19.21 kg/m    Patient declined breast exam today Physical Exam Constitutional:      General: She is not in acute distress.    Appearance: She is normal weight. She is not ill-appearing, toxic-appearing or diaphoretic.  HENT:     Head: Normocephalic and atraumatic.     Right Ear: Tympanic membrane, ear canal and external ear normal. There is no impacted cerumen.     Left Ear: Tympanic membrane, ear canal and external ear normal. There is no impacted cerumen.     Nose: Nose normal. No congestion or rhinorrhea.     Mouth/Throat:     Mouth: Mucous membranes are moist.     Pharynx: Oropharynx is clear. No oropharyngeal exudate or posterior oropharyngeal erythema.  Eyes:     General: No scleral icterus.       Right eye: No discharge.        Left eye: No discharge.     Extraocular Movements: Extraocular movements intact.     Conjunctiva/sclera: Conjunctivae normal.     Pupils: Pupils are equal, round, and reactive to light.  Neck:     Vascular: No carotid bruit.  Cardiovascular:     Rate and Rhythm: Normal rate and regular rhythm.     Pulses: Normal pulses.     Heart sounds: Normal heart sounds. No murmur heard.   No friction rub. No gallop.  Pulmonary:     Effort: Pulmonary effort is normal. No respiratory distress.     Breath sounds: No stridor. No wheezing, rhonchi or rales.     Comments: Decreased breath sound bilaterally  Chest:     Chest wall: No tenderness.  Abdominal:     General: Abdomen is flat. There is no distension.     Palpations: There is no mass.     Tenderness: There is no abdominal tenderness. There is no right CVA tenderness, left CVA tenderness, guarding or rebound.     Hernia: No hernia is present.  Musculoskeletal:        General: No swelling, tenderness, deformity or signs of injury. Normal range of motion.     Cervical back: Normal range of motion and neck supple. No rigidity or tenderness.     Right lower leg: No edema.     Left lower leg: No edema.   Lymphadenopathy:     Cervical: No cervical adenopathy.  Skin:    General: Skin is warm and dry.     Capillary Refill: Capillary refill takes less than 2 seconds.     Coloration: Skin is not jaundiced or pale.     Findings: No bruising, erythema, lesion or rash.  Neurological:     Mental Status: She is alert and oriented to person, place, and time.     Cranial Nerves: No cranial nerve deficit.     Sensory: No sensory deficit.     Motor: No weakness.     Coordination: Coordination normal.     Gait: Gait normal.  Deep Tendon Reflexes: Reflexes normal.  Psychiatric:        Mood and Affect: Mood normal.        Behavior: Behavior normal.        Thought Content: Thought content normal.        Judgment: Judgment normal.     No results found for any visits on 03/01/22.     Assessment & Plan:    Routine Health Maintenance and Physical Exam  Immunization History  Administered Date(s) Administered   Fluad Quad(high Dose 65+) 10/13/2021   Alphonsa Overall ([employer]) SARS-COV-2 Vaccination 12/07/2019   Moderna SARS-COV2 Booster Vaccination 10/22/2020    Health Maintenance  Topic Date Due   TETANUS/TDAP  Never done   COLONOSCOPY (Pts 45-54yrs Insurance coverage will need to be confirmed)  Never done   Zoster Vaccines- Shingrix (1 of 2) Never done   Pneumonia Vaccine 75+ Years old (1 - PCV) Never done   DEXA SCAN  Never done   INFLUENZA VACCINE  04/25/2022   MAMMOGRAM  01/20/2023   Hepatitis C Screening  Completed   HPV VACCINES  Aged Out   COVID-19 Vaccine  Discontinued    Discussed health benefits of physical activity, and encouraged her to engage in regular exercise appropriate for her age and condition.  Problem List Items Addressed This Visit       Cardiovascular and Mediastinum   Aortic atherosclerosis ([place])    The 10-year ASCVD risk score (Arnett DK, et al., 2019) is: 8.2%   Values used to calculate the score:     Age: 13 years     Sex: Female     Is Non-Hispanic African  American: Yes     Diabetic: No     Tobacco smoker: No     Systolic Blood Pressure: 0000000 mmHg     Is BP treated: No     HDL Cholesterol: 68 mg/dL     Total Cholesterol: 187 mg/dL       Atherosclerosis of native coronary artery of native heart without angina pectoris    Incidentally found on recent CT Current everyday smoker Not on a statin Check fasting lipid panel I discussed to starting  patient on  A statin if  appropriate when labs are back.  Smoking cessation education completed  The 10-year ASCVD risk score (Arnett DK, et al., 2019) is: 8.2%   Values used to calculate the score:     Age: 1 years     Sex: Female     Is Non-Hispanic African American: Yes     Diabetic: No     Tobacco smoker: No     Systolic Blood Pressure: 0000000 mmHg     Is BP treated: No     HDL Cholesterol: 68 mg/dL     Total Cholesterol: 187 mg/dL         Respiratory   COPD (chronic obstructive pulmonary disease) with emphysema (HCC)     condition well-controlled on Breztri inhaler Stated that she has not needed albuterol inhaler in a while Continue breztri inhaler 106-9-4.8 mcg/act.aero 2 puffs 2 times daily Patient encouraged to maintain close follow-up with pulmonology. Smoking cessation education completed         Other   Smoker    Smokes about 0.5 pack/day, was previously smoking 2.5 packs daily until 5 months ago.  Asked about quitting: confirms that he/she currently smokes cigarettes Advise to quit smoking: Educated about QUITTING to reduce the risk of cancer, cardio and cerebrovascular disease. Assess willingness: Unwilling  to quit at this time, but is working on cutting back. Assist with counseling and pharmacotherapy: Counseled for 5 minutes and literature provided. Arrange for follow up: follow up in 12 months and continue to offer help.      Annual physical exam - Primary    Annual exam as documented.  Counseling done include healthy lifestyle involving committing to 150  minutes of exercise per week, heart healthy diet, and attaining healthy weight. The importance of adequate sleep also discussed.  Regular use of seat belt and home safety were also discussed . Changes in health habits are decided on by patient with goals and time frames set for achieving them.  Due for pneumonia vaccine, Tdap vaccine, shingles vaccine need for all vaccines discussed with patient patient encouraged to get all vaccines at her pharmacy.  Screening mammogram, Cologuard ordered today       Insomnia    Chronic condition Sleeps 4 to 5 hours at night Take OTC melatonin at bedtime as needed       Weight loss, non-intentional    Wt Readings from Last 3 Encounters:  03/01/22 119 lb (54 kg)  11/01/21 127 lb 1.3 oz (57.6 kg)  10/26/21 126 lb 1.9 oz (57.2 kg)  Has lost 8 pounds since last visit Denies fever chills loss of appetite, bloody stool, night sweats, bloody sputum. Patient told to take Ensure nutritional supplements drink twice daily in between meals. Colon cancer screening, screening mammogram ordered today. Checking vitamin D levels CBC, TSH      Other Visit Diagnoses     Screening for colon cancer       Relevant Orders   Cologuard   Encounter for screening mammogram for malignant neoplasm of breast       Relevant Orders   MM 3D SCREEN BREAST BILATERAL      Return in about 1 year (around 03/02/2023) for CPE .     Renee Rival, FNP

## 2022-03-01 NOTE — Assessment & Plan Note (Signed)
condition well-controlled on Breztri inhaler Stated that she has not needed albuterol inhaler in a while Continue breztri inhaler 106-9-4.8 mcg/act.aero 2 puffs 2 times daily Patient encouraged to maintain close follow-up with pulmonology. Smoking cessation education completed 

## 2022-03-01 NOTE — Assessment & Plan Note (Signed)
Smokes about 0.5 pack/day, was previously smoking 2.5 packs daily until 5 months ago.  Asked about quitting: confirms that he/she currently smokes cigarettes Advise to quit smoking: Educated about QUITTING to reduce the risk of cancer, cardio and cerebrovascular disease. Assess willingness: Unwilling to quit at this time, but is working on cutting back. Assist with counseling and pharmacotherapy: Counseled for 5 minutes and literature provided. Arrange for follow up: follow up in 12 months and continue to offer help.

## 2022-03-01 NOTE — Assessment & Plan Note (Addendum)
Wt Readings from Last 3 Encounters:  03/01/22 119 lb (54 kg)  11/01/21 127 lb 1.3 oz (57.6 kg)  10/26/21 126 lb 1.9 oz (57.2 kg)  Has lost 8 pounds since last visit Denies fever chills loss of appetite, bloody stool, night sweats, bloody sputum. Patient told to take Ensure nutritional supplements drink twice daily in between meals. Colon cancer screening, screening mammogram ordered today. Checking vitamin D levels CBC, TSH

## 2022-03-01 NOTE — Patient Instructions (Addendum)
Please get your TDAP vaccine, shingles vaccine and pneumonia vaccine at your pharmacy   Please take ensure nutritional supplement drink  two times daily between meal   Pleas take over the counter melatonin as needed for help you sleep.    It is important that you exercise regularly at least 30 minutes 5 times a week.  Think about what you will eat, plan ahead. Choose " clean, green, fresh or frozen" over canned, processed or packaged foods which are more sugary, salty and fatty. 70 to 75% of food eaten should be vegetables and fruit. Three meals at set times with snacks allowed between meals, but they must be fruit or vegetables. Aim to eat over a 12 hour period , example 7 am to 7 pm, and STOP after  your last meal of the day. Drink water,generally about 64 ounces per day, no other drink is as healthy. Fruit juice is best enjoyed in a healthy way, by EATING the fruit.  Thanks for choosing [facility], we consider it a privelige to serve you.

## 2022-03-01 NOTE — Assessment & Plan Note (Signed)
The 10-year ASCVD risk score (Arnett DK, et al., 2019) is: 8.2%   Values used to calculate the score:     Age: 66 years     Sex: Female     Is Non-Hispanic African American: Yes     Diabetic: No     Tobacco smoker: No     Systolic Blood Pressure: 125 mmHg     Is BP treated: No     HDL Cholesterol: 68 mg/dL     Total Cholesterol: 187 mg/dL

## 2022-03-02 DIAGNOSIS — Z8249 Family history of ischemic heart disease and other diseases of the circulatory system: Secondary | ICD-10-CM | POA: Diagnosis not present

## 2022-03-02 DIAGNOSIS — Z809 Family history of malignant neoplasm, unspecified: Secondary | ICD-10-CM | POA: Diagnosis not present

## 2022-03-02 DIAGNOSIS — R69 Illness, unspecified: Secondary | ICD-10-CM | POA: Diagnosis not present

## 2022-03-02 DIAGNOSIS — K08109 Complete loss of teeth, unspecified cause, unspecified class: Secondary | ICD-10-CM | POA: Diagnosis not present

## 2022-03-02 DIAGNOSIS — J439 Emphysema, unspecified: Secondary | ICD-10-CM | POA: Diagnosis not present

## 2022-03-13 ENCOUNTER — Ambulatory Visit (INDEPENDENT_AMBULATORY_CARE_PROVIDER_SITE_OTHER): Payer: Medicare HMO | Admitting: Family Medicine

## 2022-03-13 ENCOUNTER — Encounter: Payer: Self-pay | Admitting: Family Medicine

## 2022-03-13 DIAGNOSIS — J440 Chronic obstructive pulmonary disease with acute lower respiratory infection: Secondary | ICD-10-CM | POA: Diagnosis not present

## 2022-03-13 DIAGNOSIS — J209 Acute bronchitis, unspecified: Secondary | ICD-10-CM | POA: Diagnosis not present

## 2022-03-13 MED ORDER — DOXYCYCLINE HYCLATE 100 MG PO TABS: 100.0000 mg | ORAL_TABLET | Freq: Two times a day (BID) | ORAL | 0 refills | Status: AC

## 2022-03-13 MED ORDER — AMOXICILLIN-POT CLAVULANATE 875-125 MG PO TABS: 1.0000 | ORAL_TABLET | Freq: Two times a day (BID) | ORAL | 0 refills | Status: DC

## 2022-03-13 NOTE — Progress Notes (Signed)
Virtual Visit via Telephone Note   This visit type was conducted due to national recommendations for restrictions regarding the COVID-19 Pandemic (e.g. social distancing) in an effort to limit this patient's exposure and mitigate transmission in our community.  Due to her co-morbid illnesses, this patient is at least at moderate risk for complications without adequate follow up.  This format is felt to be most appropriate for this patient at this time.  The patient did not have access to video technology/had technical difficulties with video requiring transitioning to audio format only (telephone).  All issues noted in this document were discussed and addressed.  No physical exam could be performed with this format.  Please refer to the patient's chart for her  consent to telehealth for [facility].   Evaluation Performed:  Follow-up visit  Date:  03/13/2022   ID:  Stephanie Hendrix, DOB 01/13/1956, MRN [id number]  Patient Location: Home Provider Location: Office/Clinic  Participants: Patient Location of Patient: Home Location of Provider: Telehealth Consent was obtain for visit to be over via telehealth. I verified that I am speaking with the correct person using two identifiers.  PCP:  Donell Beers, FNP   Chief Complaint: coughing yellow-green phlegm  History of Present Illness:   Stephanie Hendrix is a 66 y.o. female with c/o slight fever, chills, SOB only while coughing with yellow-green phlegm. Onset of symptoms is one week. She denies chest pain, fatigue, headaches, sinus pain, pressure, and myalgia.    The patient does not have symptoms concerning for COVID-19 infection (fever, chills, cough, or new shortness of breath).   Past Medical, Surgical, Social History, Allergies, and Medications have been Reviewed.  Past Medical History:  Diagnosis Date   Allergy    COPD (chronic obstructive pulmonary disease) (HCC)    Syncope    Tobacco abuse 12/22/2016   Past  Surgical History:  Procedure Laterality Date   CHOLECYSTECTOMY       Current Meds  Medication Sig   albuterol (VENTOLIN HFA) 108 (90 Base) MCG/ACT inhaler Inhale 2 puffs into the lungs every 6 (six) hours as needed for wheezing or shortness of breath.   Budeson-Glycopyrrol-Formoterol (BREZTRI AEROSPHERE) 160-9-4.8 MCG/ACT AERO Take 2 puffs first thing in am and then another 2 puffs about 12 hours later.   Budeson-Glycopyrrol-Formoterol (BREZTRI AEROSPHERE) 160-9-4.8 MCG/ACT AERO Inhale 2 puffs into the lungs in the morning and at bedtime.   doxycycline (VIBRA-TABS) 100 MG tablet Take 1 tablet (100 mg total) by mouth 2 (two) times daily for 7 days.   ibuprofen (ADVIL) 800 MG tablet Take 800 mg by mouth every 8 (eight) hours as needed.   [DISCONTINUED] amoxicillin-clavulanate (AUGMENTIN) 875-125 MG tablet Take 1 tablet by mouth 2 (two) times daily for 7 days.     Allergies:   Neomycin   ROS:   Please see the history of present illness.    All other systems reviewed and are negative.   Labs/Other Tests and Data Reviewed:    Recent Labs: 10/23/2021: BUN 16; Creatinine, Ser 0.85; Hemoglobin 12.7; Magnesium 2.2; Platelets 241; Potassium 4.7; Sodium 138   Recent Lipid Panel Lab Results  Component Value Date/Time   CHOL 187 12/24/2020 10:28 AM   TRIG 100 12/24/2020 10:28 AM   HDL 68 12/24/2020 10:28 AM   LDLCALC 101 (H) 12/24/2020 10:28 AM    Wt Readings from Last 3 Encounters:  03/01/22 119 lb (54 kg)  11/01/21 127 lb 1.3 oz (57.6 kg)  10/26/21 126 lb  1.9 oz (57.2 kg)     Objective:    Vital Signs:  There were no vitals taken for this visit.     ASSESSMENT & PLAN:   Acute bronchitis with COPD -will be returning to the clinic on 03/15/22 to get tested for COVID -will treat with doxycycline due to the patient's having yellow-green sputum, fever, and chills -encouraged to continue supportive treatments   Time:   Today, I have spent 11 minutes reviewing the chart, including  problem list, medications, and with the patient with telehealth technology discussing the above problems.   Medication Adjustments/Labs and Tests Ordered: Current medicines are reviewed at length with the patient today.  Concerns regarding medicines are outlined above.   Tests Ordered: No orders of the defined types were placed in this encounter.   Medication Changes: Meds ordered this encounter  Medications   DISCONTD: amoxicillin-clavulanate (AUGMENTIN) 875-125 MG tablet    Sig: Take 1 tablet by mouth 2 (two) times daily for 7 days.    Dispense:  14 tablet    Refill:  0   doxycycline (VIBRA-TABS) 100 MG tablet    Sig: Take 1 tablet (100 mg total) by mouth 2 (two) times daily for 7 days.    Dispense:  14 tablet    Refill:  0       Disposition:  Follow up  Signed, Gilmore Laroche, FNP  03/13/2022 10:26 PM     [facility]

## 2022-03-14 ENCOUNTER — Telehealth: Payer: Medicare HMO | Admitting: Nurse Practitioner

## 2022-03-15 ENCOUNTER — Ambulatory Visit
Admission: RE | Admit: 2022-03-15 | Discharge: 2022-03-15 | Disposition: A | Discharge status: Home / Self Care | Payer: Medicare HMO | Source: Ambulatory Visit | Attending: Nurse Practitioner | Admitting: Nurse Practitioner

## 2022-03-15 ENCOUNTER — Ambulatory Visit: Payer: Medicare HMO

## 2022-03-15 DIAGNOSIS — R051 Acute cough: Secondary | ICD-10-CM

## 2022-03-15 DIAGNOSIS — Z139 Encounter for screening, unspecified: Secondary | ICD-10-CM | POA: Diagnosis not present

## 2022-03-15 DIAGNOSIS — J441 Chronic obstructive pulmonary disease with (acute) exacerbation: Secondary | ICD-10-CM | POA: Diagnosis not present

## 2022-03-15 DIAGNOSIS — Z1231 Encounter for screening mammogram for malignant neoplasm of breast: Secondary | ICD-10-CM | POA: Diagnosis not present

## 2022-03-16 ENCOUNTER — Encounter: Payer: Self-pay | Admitting: Internal Medicine

## 2022-03-16 ENCOUNTER — Ambulatory Visit (INDEPENDENT_AMBULATORY_CARE_PROVIDER_SITE_OTHER): Payer: Medicare HMO | Admitting: Internal Medicine

## 2022-03-16 ENCOUNTER — Other Ambulatory Visit: Payer: Self-pay | Admitting: Nurse Practitioner

## 2022-03-16 VITALS — BP 128/70 | HR 60 | Temp 97.6°F | Ht 66.5 in | Wt 118.0 lb

## 2022-03-16 DIAGNOSIS — J449 Chronic obstructive pulmonary disease, unspecified: Secondary | ICD-10-CM

## 2022-03-16 DIAGNOSIS — F17210 Nicotine dependence, cigarettes, uncomplicated: Secondary | ICD-10-CM | POA: Diagnosis not present

## 2022-03-16 DIAGNOSIS — E559 Vitamin D deficiency, unspecified: Secondary | ICD-10-CM

## 2022-03-16 DIAGNOSIS — R69 Illness, unspecified: Secondary | ICD-10-CM | POA: Diagnosis not present

## 2022-03-16 LAB — CMP14+EGFR
ALT: 13 IU/L (ref 0–32)
AST: 13 IU/L (ref 0–40)
Albumin/Globulin Ratio: 1.5 (ref 1.2–2.2)
Albumin: 4 g/dL (ref 3.8–4.8)
Alkaline Phosphatase: 93 IU/L (ref 44–121)
BUN/Creatinine Ratio: 6 — ABNORMAL LOW (ref 12–28)
BUN: 5 mg/dL — ABNORMAL LOW (ref 8–27)
Bilirubin Total: <0.2 mg/dL (ref 0.0–1.2)
CO2: 19 mmol/L — ABNORMAL LOW (ref 20–29)
Calcium: 9.7 mg/dL (ref 8.7–10.3)
Chloride: 102 mmol/L (ref 96–106)
Creatinine, Ser: 0.79 mg/dL (ref 0.57–1.00)
Globulin, Total: 2.7 g/dL (ref 1.5–4.5)
Glucose: 98 mg/dL (ref 70–99)
Potassium: 4.4 mmol/L (ref 3.5–5.2)
Sodium: 141 mmol/L (ref 134–144)
Total Protein: 6.7 g/dL (ref 6.0–8.5)
eGFR: 82 mL/min/{1.73_m2} (ref >59)

## 2022-03-16 LAB — CBC WITH DIFFERENTIAL/PLATELET
Basophils Absolute: 0 10*3/uL (ref 0.0–0.2)
Basos: 0 %
EOS (ABSOLUTE): 0.1 10*3/uL (ref 0.0–0.4)
Eos: 1 %
Hematocrit: 40 % (ref 34.0–46.6)
Hemoglobin: 12.5 g/dL (ref 11.1–15.9)
Immature Grans (Abs): 0 10*3/uL (ref 0.0–0.1)
Immature Granulocytes: 0 %
Lymphocytes Absolute: 3.1 10*3/uL (ref 0.7–3.1)
Lymphs: 45 %
MCH: 23.8 pg — ABNORMAL LOW (ref 26.6–33.0)
MCHC: 31.3 g/dL — ABNORMAL LOW (ref 31.5–35.7)
MCV: 76 fL — ABNORMAL LOW (ref 79–97)
Monocytes Absolute: 0.4 10*3/uL (ref 0.1–0.9)
Monocytes: 5 %
Neutrophils Absolute: 3.3 10*3/uL (ref 1.4–7.0)
Neutrophils: 49 %
Platelets: 406 10*3/uL (ref 150–450)
RBC: 5.26 x10E6/uL (ref 3.77–5.28)
RDW: 13.6 % (ref 11.7–15.4)
WBC: 6.8 10*3/uL (ref 3.4–10.8)

## 2022-03-16 LAB — LIPID PANEL
Chol/HDL Ratio: 3 ratio (ref 0.0–4.4)
Cholesterol, Total: 153 mg/dL (ref 100–199)
HDL: 51 mg/dL (ref >39)
LDL Chol Calc (NIH): 86 mg/dL (ref 0–99)
Triglycerides: 84 mg/dL (ref 0–149)
VLDL Cholesterol Cal: 16 mg/dL (ref 5–40)

## 2022-03-16 LAB — TSH: TSH: 0.512 u[IU]/mL (ref 0.450–4.500)

## 2022-03-16 LAB — HEMOGLOBIN A1C
Est. average glucose Bld gHb Est-mCnc: 117 mg/dL
Hgb A1c MFr Bld: 5.7 % — ABNORMAL HIGH (ref 4.8–5.6)

## 2022-03-16 LAB — VITAMIN D 25 HYDROXY (VIT D DEFICIENCY, FRACTURES): Vit D, 25-Hydroxy: 15.5 ng/mL — ABNORMAL LOW (ref 30.0–100.0)

## 2022-03-16 MED ORDER — VITAMIN D (ERGOCALCIFEROL) 1.25 MG (50000 UNIT) PO CAPS: 50000.0000 [IU] | ORAL_CAPSULE | ORAL | 0 refills | Status: DC

## 2022-03-16 NOTE — Assessment & Plan Note (Signed)
Active smoker - screening LDCT per PCP  4-5 min discussion re active cigarette smoking in addition to office E&M  Ask about tobacco use:   ongoing Advise quitting   I took an extended  opportunity with this patient to outline the consequences of continued cigarette use  in airway disorders based on all the data we have from the multiple national lung health studies (perfomed over decades at millions of dollars in cost)  indicating that smoking cessation, not choice of inhalers or physicians, is the most important aspect of her care.   Assess willingness:  Not committed at this point Assist in quit attempt:  Per PCP when ready Arrange follow up:   Follow up per Primary Care planned

## 2022-03-16 NOTE — Patient Instructions (Addendum)
Work on Cabin crew  inhaler technique:  relax and gently blow all the way out then take a nice smooth full deep breath back in, triggering the inhaler at same time you start breathing in.  Hold for up to 5 seconds if you can. Blow out thru nose. Rinse and gargle with water when done.  If mouth or throat bother you at all,  try brushing teeth/gums/tongue with arm and hammer toothpaste/ make a slurry and gargle and spit out.   - remember to take practice breaths with empty inhaler   For cough > mucinex dm 1200 mg every 12 hours as needed  Try prilosec otc 20mg   Take 30-60 min before first meal of the day and Pepcid ac (famotidine) 20 mg one @  bedtime until cough is completely gone for at least a week without the need for cough suppression     The key is to stop smoking completely before smoking completely stops you!       Please schedule a follow up visit in 3-4  months but call sooner if needed with PFTs on return

## 2022-03-16 NOTE — Assessment & Plan Note (Addendum)
Active smoker - 10/26/2021   rx breztri 2 bid  - 03/16/2022  After extensive coaching inhaler device,  effectiveness =    75% from a baseline of < 50%   Group D (now reclassified as E) in terms of symptom/risk and laba/lama/ICS  therefore appropriate rx at this point >>>  breztri plus more approp saba:  Re SABA :  I spent extra time with pt today reviewing appropriate use of albuterol for prn use on exertion with the following points: 1) saba is for relief of sob that does not improve by walking a slower pace or resting but rather if the pt does not improve after trying this first. 2) If the pt is convinced, as many are, that saba helps recover from activity faster then it's easy to tell if this is the case by re-challenging : ie stop, take the inhaler, then p 5 minutes try the exact same activity (intensity of workload) that just caused the symptoms and see if they are substantially diminished or not after saba 3) if there is an activity that reproducibly causes the symptoms, try the saba 15 min before the activity on alternate days   If in fact the saba really does help, then fine to continue to use it prn but advised may need to look closer at the maintenance regimen being used to achieve better control of airways disease with exertion.    >>>f/u with pfts next available         Each maintenance medication was reviewed in detail including emphasizing most importantly the difference between maintenance and prns and under what circumstances the prns are to be triggered using an action plan format where appropriate.  Total time for H and P, chart review, counseling, reviewing hfa device(s) and generating customized AVS unique to this office visit / same day charting = 20 min

## 2022-03-17 LAB — NOVEL CORONAVIRUS, NAA: SARS-CoV-2, NAA: NOT DETECTED

## 2022-04-26 ENCOUNTER — Ambulatory Visit: Payer: Medicare HMO | Admitting: Family Medicine

## 2022-04-28 ENCOUNTER — Ambulatory Visit: Payer: Medicare HMO | Admitting: Internal Medicine

## 2022-04-28 ENCOUNTER — Encounter: Payer: Self-pay | Admitting: Nurse Practitioner

## 2022-07-03 ENCOUNTER — Encounter: Payer: Self-pay | Admitting: Internal Medicine

## 2022-07-03 ENCOUNTER — Ambulatory Visit (INDEPENDENT_AMBULATORY_CARE_PROVIDER_SITE_OTHER): Payer: Medicare HMO | Admitting: Internal Medicine

## 2022-07-03 VITALS — BP 116/68 | HR 52 | Temp 97.6°F | Ht 66.5 in | Wt 114.2 lb

## 2022-07-03 DIAGNOSIS — Z23 Encounter for immunization: Secondary | ICD-10-CM

## 2022-07-03 DIAGNOSIS — J4489 Other specified chronic obstructive pulmonary disease: Secondary | ICD-10-CM | POA: Diagnosis not present

## 2022-07-03 DIAGNOSIS — F17210 Nicotine dependence, cigarettes, uncomplicated: Secondary | ICD-10-CM

## 2022-07-03 DIAGNOSIS — R69 Illness, unspecified: Secondary | ICD-10-CM | POA: Diagnosis not present

## 2022-07-03 NOTE — Assessment & Plan Note (Signed)
Active smoker - screening LDCT per PCP  Counseled re importance of smoking cessation but did not meet time criteria for separate billing           Each maintenance medication was reviewed in detail including emphasizing most importantly the difference between maintenance and prns and under what circumstances the prns are to be triggered using an action plan format where appropriate.  Total time for H and P, chart review, counseling, reviewing hfa  device(s) and generating customized AVS unique to this office visit / same day charting = 23 min

## 2022-07-03 NOTE — Assessment & Plan Note (Signed)
Active smoker - 10/26/2021   rx breztri 2 bid  - 07/03/2022  After extensive coaching inhaler device,  effectiveness =    75% from baseline 25%    Group D (now reclassified as E) in terms of symptom/risk and laba/lama/ICS  therefore appropriate rx at this point >>>  Breztri, approp saba, and return for pfts

## 2022-07-03 NOTE — Progress Notes (Signed)
Stephanie Hendrix, female    DOB: Feb 01, 1956,    MRN: [id number]   Brief patient profile:  64 yobf active smoker retired cook  referred to pulmonary clinic in [place]  10/26/2021 by [facility] hospitalist  for ? [employer]    Admit date: 10/22/2021   Discharge date: 10/23/2021   Admitted From:Home   Disposition:  Home   Recommendations for Outpatient Follow-up:  Follow up with PCP in 1-2 weeks Follow-up with pulmonology as scheduled in the next 1-2 weeks for COPD Continue prednisone as prescribed for 5 more days DuoNebs every 6 hours as needed for shortness of breath or wheezing Counseled on smoking cessation   Brief/Interim Summary:  Stephanie Hendrix is a 66 y.o. female with medical history significant for chronic tobacco abuse and suspected COPD who presented to the ED with cough and shortness of breath for the last 2 weeks.  She saw her PCP last Thursday and was prescribed an inhaler and was noted to have a negative COVID test.  She was admitted with acute hypoxemic respiratory failure secondary to COPD exacerbation with some bronchitis.  She was only requiring 2 L nasal cannula oxygen and was quickly weaned off.  She was started on doxycycline as well as inhaled and systemic steroids as well as breathing treatments with significant improvement the following day.  She states that she will work on smoking cessation and agrees to follow-up with pulmonology outpatient.  No other acute events noted throughout the course of this brief stay.  She has no home oxygen requirement on discharge.   Discharge Diagnoses:  Principal discharge diagnosis: Acute hypoxemic respiratory failure secondary to COPD exacerbation with bronchitis.  Ongoing tobacco abuse.   History of Present Illness  10/26/2021  Pulmonary/ 1st office eval/ Stephanie Hendrix / [facility]  Chief Complaint  Patient presents with   Consult    Hosp admission from  10/22/2021-10/23/2021.   Dyspnea:  baseline = no problem walking including hills an  hour at a time  then around mid Jan 2013 cough/ chest congestion/ wheeze > gradually worse x 2 weeks and  admit and back to ok with adls since d/c but not doing much walking yet.  Cough: none now  Sleep: ok flat bed  SABA use: 2puffs of albuterol this am / has neb not used today  Rec Plan A = Automatic = Always=    Breztri up to 2 pffs every 12 hours  Work on inhaler technique:   Plan B = Backup (to supplement plan A, not to replace it) Only use your albuterol inhaler as a rescue medication  Plan C = Crisis (instead of Plan B but only if Plan B stops working) - only use your albuterol nebulizer if you first try Plan B and it fails to help Congratulations on stopping smoking - most important aspect of your care     03/16/2022  f/u ov/[place] office/Stephanie Hendrix re: AB maint on Breztri and normally not using neb but uses saba p ex  Chief Complaint  Patient presents with   Follow-up    Patient states she was sick for a week other than that patient states she was improving.   Dyspnea:  really not much change with doe unless cough Cough: smoker's rattle  but improving / was yellow clear p rx since 6/19 /23 with augmentin/ dox Sleeping: does fine then cough  worse an hour p stirring  SABA NTI:RWER neb this week but less than prior to breztri 02: none  Covid status:  vax x 3  Lung cancer screening: on program  Rec Work on perfecting  inhaler technique:    - remember to take practice breaths with empty inhaler  For cough > mucinex dm 1200 mg every 12 hours as needed Try prilosec otc 20mg   Take 30-60 min before first meal of the day and Pepcid ac (famotidine) 20 mg one @  bedtime until cough is completely gone for at least a week     The key is to stop smoking completely before smoking completely stops you!   Please schedule a follow up visit in 3-4  months but call sooner if needed with PFTs on return > not done    07/03/2022  f/u ov/[place] office/Stephanie Hendrix re: AB maint on Breztri  2 bid prn   Chief Complaint  Patient presents with   Follow-up    Breathing has greatly improved since last ov.   Dyspnea:  Not limited by breathing from desired activities  / same pace as others Cough: still smoker's rattle Sleeping: ok flat  SABA use: hardly at all  02: none  Lung cancer screening: per PCP    No obvious day to day or daytime variability or assoc excess/ purulent sputum or mucus plugs or hemoptysis or cp or chest tightness, subjective wheeze or overt sinus or hb symptoms.   Sleeping  without nocturnal  or early am exacerbation  of respiratory  c/o's or need for noct saba. Also denies any obvious fluctuation of symptoms with weather or environmental changes or other aggravating or alleviating factors except as outlined above   No unusual exposure hx or h/o childhood pna/ asthma or knowledge of premature birth.  Current Allergies, Complete Past Medical History, Past Surgical History, Family History, and Social History were reviewed in 09/02/2022 record.  ROS  The following are not active complaints unless bolded Hoarseness, sore throat, dysphagia, dental problems, itching, sneezing,  nasal congestion or discharge of excess mucus or purulent secretions, ear ache,   fever, chills, sweats, unintended wt loss or wt gain, classically pleuritic or exertional cp,  orthopnea pnd or arm/hand swelling  or leg swelling, presyncope, palpitations, abdominal pain, anorexia, nausea, vomiting, diarrhea  or change in bowel habits or change in bladder habits, change in stools or change in urine, dysuria, hematuria,  rash, arthralgias, visual complaints, headache, numbness, weakness or ataxia or problems with walking or coordination,  change in mood or  memory.        Current Meds  Medication Sig   albuterol (VENTOLIN HFA) 108 (90 Base) MCG/ACT inhaler Inhale 2 puffs into the lungs every 6 (six) hours as needed for wheezing or shortness of breath.    Budeson-Glycopyrrol-Formoterol (BREZTRI AEROSPHERE) 160-9-4.8 MCG/ACT AERO Take 2 puffs first thing in am and then another 2 puffs about 12 hours later.   ibuprofen (ADVIL) 800 MG tablet Take 800 mg by mouth every 8 (eight) hours as needed.   Vitamin D, Ergocalciferol, (DRISDOL) 1.25 MG (50000 UNIT) CAPS capsule Take 1 capsule (50,000 Units total) by mouth every 7 (seven) days.             Past Medical History:  Diagnosis Date   Allergy    Syncope    Tobacco abuse 12/22/2016      Objective:     07/03/2022       114  03/16/2022       118   03/01/22 119 lb (54 kg)  11/01/21 127 lb 1.3 oz (57.6 kg)  10/26/21  126 lb 1.9 oz (57.2 kg)    Vital signs reviewed  07/03/2022  - Note at rest 02 sats  97% on RA   General appearance:    amb pleasant bf nad     HEENT : Oropharynx  clear/ no upper teeth   Nasal turbinates nl    NECK :  without  apparent JVD/ palpable Nodes/TM    LUNGS: no acc muscle use,  Mild barrel  contour chest wall with bilateral  Distant bs s audible wheeze and  without cough on insp or exp maneuvers  and mild  Hyperresonant  to  percussion bilaterally     CV:  RRR  no s3 or murmur or increase in P2, and no edema   ABD:  soft and nontender with pos end  insp Hoover's  in the supine position.  No bruits or organomegaly appreciated   MS:  Nl gait/ ext warm without deformities Or obvious joint restrictions  calf tenderness, cyanosis or clubbing     SKIN: warm and dry without lesions    NEURO:  alert, approp, nl sensorium with  no motor or cerebellar deficits apparent.         Assessment

## 2022-07-03 NOTE — Patient Instructions (Addendum)
Plan A = Automatic = Always=    Breztri up to 2 puffs every 12 hours   Work on inhaler technique:  relax and gently blow all the way out then take a nice smooth full deep breath back in, triggering the inhaler at same time you start breathing in.  Hold breath in for at least  5 seconds if you can. Blow out breztri thru nose. Rinse and gargle with water when done.  If mouth or throat bother you at all,  try brushing teeth/gums/tongue with arm and hammer toothpaste/ make a slurry and gargle and spit out.  - remember how golfers warm up and use empty cannister to train on   Plan B = Backup (to supplement plan A, not to replace it) Only use your albuterol inhaler as a rescue medication to be used if you can't catch your breath by resting or doing a relaxed purse lip breathing pattern.  - The less you use it, the better it will work when you need it. - Ok to use the inhaler up to 2 puffs  every 4 hours if you must but call for appointment if use goes up over your usual need - Don't leave home without it !!  (think of it like the spare tire for your car)    My office will be contacting you by phone for referral for PFTs at [facility]   - if you don't hear back from my office within one week please call us back or notify us thru MyChart and we'll address it right away.    Please schedule a follow up visit in 6 months but call sooner if needed

## 2022-07-05 ENCOUNTER — Ambulatory Visit (INDEPENDENT_AMBULATORY_CARE_PROVIDER_SITE_OTHER): Payer: Medicare HMO | Admitting: Family Medicine

## 2022-07-05 ENCOUNTER — Encounter: Payer: Self-pay | Admitting: Family Medicine

## 2022-07-05 VITALS — BP 118/84 | HR 56 | Ht 66.5 in | Wt 114.1 lb

## 2022-07-05 DIAGNOSIS — E038 Other specified hypothyroidism: Secondary | ICD-10-CM

## 2022-07-05 DIAGNOSIS — R7301 Impaired fasting glucose: Secondary | ICD-10-CM

## 2022-07-05 DIAGNOSIS — Z720 Tobacco use: Secondary | ICD-10-CM

## 2022-07-05 DIAGNOSIS — Z23 Encounter for immunization: Secondary | ICD-10-CM | POA: Diagnosis not present

## 2022-07-05 DIAGNOSIS — E559 Vitamin D deficiency, unspecified: Secondary | ICD-10-CM

## 2022-07-05 DIAGNOSIS — G4700 Insomnia, unspecified: Secondary | ICD-10-CM

## 2022-07-05 DIAGNOSIS — J438 Other emphysema: Secondary | ICD-10-CM | POA: Diagnosis not present

## 2022-07-05 NOTE — Assessment & Plan Note (Signed)
Continue Breztri inhaler, nebulizer inhaler,and nebulizer or inhaler on an as-needed basis Encouraged to continue following up with her pulmonologist as scheduled

## 2022-07-05 NOTE — Assessment & Plan Note (Addendum)
She reports cutting back on her tobacco use She used to smoke 2 pack of cigarettes daily Now she smokes 8 cigarettes daily She reported that she will be starting her Nicorette gum today Congratulated patient on her desire to quit smoking Encourage patient to inform me of how I can assist her during this process

## 2022-07-05 NOTE — Assessment & Plan Note (Signed)
She reports getting adequate hours of sleep at nighttime No intervention is warranted at this moment

## 2022-07-05 NOTE — Progress Notes (Signed)
Established Patient Office Visit  Subjective:  Patient ID: Stephanie Hendrix, female    DOB: [birth year]  Age: 66 y.o. MRN: [id number]  CC: No chief complaint on file.   HPI Stephanie Hendrix is a 66 y.o. female with past medical history of COPD, tobacco use, and insomnia presents for f/u of  chronic medical conditions.  COPD: She is on Breztri inhaler, nebulizer inhaler,and nebulizer or inhaler.  She reports using all 3 treatments on  an as-needed basis.  She denies increased cough,chest tightness, shortness of breath and chest pain.  She denies recent episodes of COPD exacerbations.   Tobacco use: She reports that she used to smoke 2 packs daily, but now she smokes 8 cigarettes daily and is trying to quit.  She reports picking up Nicorette gum and will start application today.   Insomnia: She reports sleeping well at nighttime with no pharmacological treatments needed.    Past Medical History:  Diagnosis Date   Allergy    COPD (chronic obstructive pulmonary disease) ([place])    Syncope    Tobacco abuse 12/22/2016    Past Surgical History:  Procedure Laterality Date   CHOLECYSTECTOMY      Family History  Problem Relation Age of Onset   COPD Mother    Cancer Father    Hypertension Sister    Diabetes Sister    Heart disease Sister    Breast cancer Maternal Aunt    Alcohol abuse Brother    COPD Brother     Social History   Socioeconomic History   Marital status: Divorced    Spouse name: Not on file   Number of children: 6   Years of education: 12   Highest education level: Not on file  Occupational History   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 Per day.   Vaping Use   Vaping Use: Never used  Substance and Sexual Activity   Alcohol use: No   Drug use:  No   Sexual activity: Not Currently    Birth control/protection: Post-menopausal  Other Topics Concern   Not on file  Social History Narrative   Lives with her mother and her daughter.    Social Determinants of Health   Financial Resource Strain: Low Risk  (10/13/2021)   Overall Financial Resource Strain (CARDIA)    Difficulty of Paying Living Expenses: Not hard at all  Food Insecurity: No Food Insecurity (10/13/2021)   Hunger Vital Sign    Worried About Running Out of Food in the Last Year: Never true    Ran Out of Food in the Last Year: Never true  Transportation Needs: No Transportation Needs (10/13/2021)   PRAPARE - Hydrologist (Medical): No    Lack of Transportation (Non-Medical): No  Physical Activity: Sufficiently Active (10/13/2021)   Exercise Vital Sign    Days of Exercise per Week: 7 days    Minutes of Exercise per Session: 30 min  Stress: No Stress Concern Present (10/13/2021)   [place]    Feeling of Stress : Not at all  Social Connections: Moderately Isolated (10/13/2021)   Social Connection and Isolation Panel [NHANES]  Frequency of Communication with Friends and Family: More than three times a week    Frequency of Social Gatherings with Friends and Family: More than three times a week    Attends Religious Services: 1 to 4 times per year    Active Member of [employer] or Organizations: No    Attends Archivist Meetings: Never    Marital Status: Divorced  Human resources officer Violence: Not At Risk (10/13/2021)   Humiliation, Afraid, Rape, and Kick questionnaire    Fear of Current or Ex-Partner: No    Emotionally Abused: No    Physically Abused: No    Sexually Abused: No    Outpatient Medications Prior to Visit  Medication Sig Dispense Refill   albuterol (VENTOLIN HFA) 108 (90 Base) MCG/ACT inhaler Inhale 2 puffs into the lungs every 6 (six) hours as needed for  wheezing or shortness of breath. 8 g 0   Budeson-Glycopyrrol-Formoterol (BREZTRI AEROSPHERE) 160-9-4.8 MCG/ACT AERO Take 2 puffs first thing in am and then another 2 puffs about 12 hours later. 10.7 g 11   ibuprofen (ADVIL) 800 MG tablet Take 800 mg by mouth every 8 (eight) hours as needed.     Vitamin D, Ergocalciferol, (DRISDOL) 1.25 MG (50000 UNIT) CAPS capsule Take 1 capsule (50,000 Units total) by mouth every 7 (seven) days. 8 capsule 0   ipratropium-albuterol (DUONEB) 0.5-2.5 (3) MG/3ML SOLN Take 3 mLs by nebulization every 6 (six) hours as needed (for shortness of breath or wheezing). 360 mL 1   No facility-administered medications prior to visit.    Allergies  Allergen Reactions   Neomycin Rash    ROS Review of Systems  Constitutional:  Negative for chills, fatigue and fever.  HENT:  Negative for congestion, sinus pressure and sore throat.   Eyes:  Negative for visual disturbance.  Respiratory:  Positive for wheezing. Negative for cough, chest tightness and shortness of breath.   Cardiovascular:  Negative for chest pain and palpitations.  Gastrointestinal:  Negative for nausea and vomiting.  Neurological:  Negative for dizziness and headaches.      Objective:    Physical Exam HENT:     Head: Normocephalic.     Right Ear: External ear normal.     Left Ear: External ear normal.  Cardiovascular:     Rate and Rhythm: Normal rate and regular rhythm.     Pulses: Normal pulses.     Heart sounds: Normal heart sounds.  Pulmonary:     Effort: Pulmonary effort is normal.     Breath sounds: Normal breath sounds.  Neurological:     Mental Status: She is alert.     BP 118/84   Pulse (!) 56   Ht 5' 6.5" (1.689 m)   Wt 114 lb 1.9 oz (51.8 kg)   SpO2 98%   BMI 18.14 kg/m  Wt Readings from Last 3 Encounters:  07/05/22 114 lb 1.9 oz (51.8 kg)  07/03/22 114 lb 3.2 oz (51.8 kg)  03/16/22 118 lb (53.5 kg)    Lab Results  Component Value Date   TSH 0.512 03/15/2022    Lab Results  Component Value Date   WBC 6.8 03/15/2022   HGB 12.5 03/15/2022   HCT 40.0 03/15/2022   MCV 76 (L) 03/15/2022   PLT 406 03/15/2022   Lab Results  Component Value Date   NA 141 03/15/2022   K 4.4 03/15/2022   CO2 19 (L) 03/15/2022   GLUCOSE 98 03/15/2022   BUN 5 (L) 03/15/2022  CREATININE 0.79 03/15/2022   BILITOT <0.2 03/15/2022   ALKPHOS 93 03/15/2022   AST 13 03/15/2022   ALT 13 03/15/2022   PROT 6.7 03/15/2022   ALBUMIN 4.0 03/15/2022   CALCIUM 9.7 03/15/2022   ANIONGAP 8 10/23/2021   EGFR 82 03/15/2022   Lab Results  Component Value Date   CHOL 153 03/15/2022   Lab Results  Component Value Date   HDL 51 03/15/2022   Lab Results  Component Value Date   LDLCALC 86 03/15/2022   Lab Results  Component Value Date   TRIG 84 03/15/2022   Lab Results  Component Value Date   CHOLHDL 3.0 03/15/2022   Lab Results  Component Value Date   HGBA1C 5.7 (H) 03/15/2022      Assessment & Plan:   Problem List Items Addressed This Visit       Respiratory   COPD (chronic obstructive pulmonary disease) with emphysema (HCC)    Continue Breztri inhaler, nebulizer inhaler,and nebulizer or inhaler on an as-needed basis Encouraged to continue following up with her pulmonologist as scheduled        Other   Tobacco use - Primary    She reports cutting back on her tobacco use She used to smoke 2 pack of cigarettes daily Now she smokes 8 cigarettes daily She reported that she will be starting her Nicorette gum today Congratulated patient on her desire to quit smoking Encourage patient to inform me of how I can assist her during this process       Insomnia    She reports getting adequate hours of sleep at nighttime No intervention is warranted at this moment      Other Visit Diagnoses     Immunization due       Relevant Orders   Pneumococcal conjugate vaccine 20-valent (Completed)   Vitamin D deficiency       Relevant Orders   Vitamin D (25  hydroxy)   IFG (impaired fasting glucose)       Relevant Orders   CBC with Differential/Platelet   CMP14+EGFR   Hemoglobin A1C   Lipid Profile   Other specified hypothyroidism       Relevant Orders   TSH + free T4       No orders of the defined types were placed in this encounter.   Follow-up: Return in about 4 months (around 11/05/2022).    Alvira Monday, FNP

## 2022-07-05 NOTE — Patient Instructions (Addendum)
I appreciate the opportunity to provide care to you today!    Follow up:  4 months  Fasting Labs: please stop by the lab during the week to get your blood drawn (CBC, CMP, TSH, Lipid profile, HgA1c, Vit D)      Please continue to a heart-healthy diet and increase your physical activities. Try to exercise for 30mins at least three times a week.      It was a pleasure to see you and I look forward to continuing to work together on your health and well-being. Please do not hesitate to call the office if you need care or have questions about your care.   Have a wonderful day and week. With Gratitude, Viola Placeres MSN, FNP-BC  

## 2022-10-19 ENCOUNTER — Ambulatory Visit (INDEPENDENT_AMBULATORY_CARE_PROVIDER_SITE_OTHER): Payer: Medicare HMO

## 2022-10-19 VITALS — Ht 66.0 in | Wt 119.0 lb

## 2022-10-19 DIAGNOSIS — Z0000 Encounter for general adult medical examination without abnormal findings: Secondary | ICD-10-CM

## 2022-10-19 DIAGNOSIS — Z1211 Encounter for screening for malignant neoplasm of colon: Secondary | ICD-10-CM | POA: Diagnosis not present

## 2022-10-19 NOTE — Progress Notes (Signed)
Subjective:   Stephanie Hendrix is a 67 y.o. female who presents for Medicare Annual (Subsequent) preventive examination.  Review of Systems     Objective:    There were no vitals filed for this visit. There is no height or weight on file to calculate BMI.     10/22/2021    7:13 AM 10/13/2021   10:31 AM 12/24/2020    4:18 PM 12/04/2020    9:47 PM 10/12/2020    8:45 AM 01/27/2020    3:18 PM 08/24/2018    3:35 PM  Advanced Directives  Does Patient Have a Medical Advance Directive? No No No No No No No  Would patient like information on creating a medical advance directive? No - Patient declined Yes (ED - Information included in AVS) No - Patient declined No - Patient declined No - Patient declined No - Patient declined No - Patient declined    Current Medications (verified) Outpatient Encounter Medications as of 10/19/2022  Medication Sig   albuterol (VENTOLIN HFA) 108 (90 Base) MCG/ACT inhaler Inhale 2 puffs into the lungs every 6 (six) hours as needed for wheezing or shortness of breath.   Budeson-Glycopyrrol-Formoterol (BREZTRI AEROSPHERE) 160-9-4.8 MCG/ACT AERO Take 2 puffs first thing in am and then another 2 puffs about 12 hours later.   ibuprofen (ADVIL) 800 MG tablet Take 800 mg by mouth every 8 (eight) hours as needed.   ipratropium-albuterol (DUONEB) 0.5-2.5 (3) MG/3ML SOLN Take 3 mLs by nebulization every 6 (six) hours as needed (for shortness of breath or wheezing).   Vitamin D, Ergocalciferol, (DRISDOL) 1.25 MG (50000 UNIT) CAPS capsule Take 1 capsule (50,000 Units total) by mouth every 7 (seven) days.   No facility-administered encounter medications on file as of 10/19/2022.    Allergies (verified) Neomycin   History: Past Medical History:  Diagnosis Date   Allergy    COPD (chronic obstructive pulmonary disease) ([facility]CC)    Syncope    Tobacco abuse 12/22/2016   Past Surgical History:  Procedure Laterality Date   CHOLECYSTECTOMY     Family History  Problem Relation  Age of Onset   COPD Mother    Cancer Father    Hypertension Sister    Diabetes Sister    Heart disease Sister    Breast cancer Maternal Aunt    Alcohol abuse Brother    COPD Brother    Social History   Socioeconomic History   Marital status: Divorced    Spouse name: Not on file   Number of children: 6   Years of education: 12   Highest education level: Not on file  Occupational History   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 Per day.   Vaping Use   Vaping Use: Never used  Substance and Sexual Activity   Alcohol use: No   Drug use: No   Sexual activity: Not Currently    Birth control/protection: Post-menopausal  Other Topics Concern   Not on file  Social History Narrative   Lives with her mother and her daughter.    Social Determinants of Health   Financial Resource Strain: Low Risk  (10/13/2021)   Overall Financial Resource Strain (CARDIA)    Difficulty of Paying  Living Expenses: Not hard at all  Food Insecurity: No Food Insecurity (10/13/2021)   Hunger Vital Sign    Worried About Running Out of Food in the Last Year: Never true    Ran Out of Food in the Last Year: Never true  Transportation Needs: No Transportation Needs (10/13/2021)   PRAPARE - Hydrologist (Medical): No    Lack of Transportation (Non-Medical): No  Physical Activity: Sufficiently Active (10/13/2021)   Exercise Vital Sign    Days of Exercise per Week: 7 days    Minutes of Exercise per Session: 30 min  Stress: No Stress Concern Present (10/13/2021)   [place]    Feeling of Stress : Not at all  Social Connections: Moderately Isolated (10/13/2021)   Social Connection and Isolation Panel [NHANES]     Frequency of Communication with Friends and Family: More than three times a week    Frequency of Social Gatherings with Friends and Family: More than three times a week    Attends Religious Services: 1 to 4 times per year    Active Member of [employer] or Organizations: No    Attends Archivist Meetings: Never    Marital Status: Divorced    Tobacco Counseling Ready to quit: Not Answered Counseling given: Not Answered Tobacco comments: Cut down to 10 cig. Per day.    Clinical Intake:  Diabetic? No    Activities of Daily Living    10/22/2021   12:45 PM 10/22/2021   12:43 PM  In your present state of health, do you have any difficulty performing the following activities:  Hearing?  0  Vision?  0  Difficulty concentrating or making decisions?  0  Walking or climbing stairs?  1  Dressing or bathing?  0  Doing errands, shopping? 0     Patient Care Team: Alvira Monday, FNP as PCP - General (Family Medicine) Harl Bowie Alphonse Guild, MD as PCP - Cardiology (Cardiology)  Indicate any recent Medical Services you may have received from other than [facility] providers in the past year (date may be approximate).     Assessment:   This is a routine wellness examination for [facility].  Hearing/Vision screen No results found.  Dietary issues and exercise activities discussed:     Goals Addressed   None   Depression Screen    07/05/2022   10:50 AM 03/13/2022    3:20 PM 03/01/2022   11:07 AM 11/01/2021   11:45 AM 10/13/2021   10:26 AM 12/24/2020    9:46 AM 10/12/2020    8:51 AM  PHQ 2/9 Scores  PHQ - 2 Score 0 0 0 0 0 0 0    Fall Risk    07/05/2022   10:50 AM 03/13/2022    3:20 PM 03/01/2022   11:07 AM 11/01/2021   11:45 AM 10/13/2021   10:32 AM  [place] in the past year? 0 0 0 0 0  Number falls in past yr: 0 0 0 0 0  Injury with Fall? 0 0 0 0 0  Risk for fall due to : No Fall Risks No Fall Risks No Fall Risks No Fall Risks No Fall Risks  Follow up Falls evaluation  completed Falls evaluation completed Falls evaluation completed Falls evaluation completed Falls evaluation completed    [place]:  Any stairs in or around the home? Yes  If so, are there any  without handrails? Yes  Home free of loose throw rugs in walkways, pet beds, electrical cords, etc? No  Adequate lighting in your home to reduce risk of falls? Yes   ASSISTIVE DEVICES UTILIZED TO PREVENT FALLS:  Life alert? No  Use of a cane, walker or w/c? No  Grab bars in the bathroom? No  Shower chair or bench in shower? No  Elevated toilet seat or a handicapped toilet? No    Cognitive Function:        10/13/2021   10:34 AM 10/12/2020    8:52 AM  6CIT Screen  What Year? 0 points 0 points  What month? 0 points 0 points  What time? 0 points 0 points  Count back from 20 0 points 0 points  Months in reverse 2 points 0 points  Repeat phrase 0 points 0 points  Total Score 2 points 0 points    Immunizations Immunization History  Administered Date(s) Administered   Fluad Quad(high Dose 65+) 10/13/2021, 07/03/2022   [place] Dibbles (J&J) SARS-COV-2 Vaccination 12/07/2019   Moderna SARS-COV2 Booster Vaccination 10/22/2020   PNEUMOCOCCAL CONJUGATE-20 07/05/2022    TDAP status: Due, Education has been provided regarding the importance of this vaccine. Advised may receive this vaccine at local pharmacy or Health Dept. Aware to provide a copy of the vaccination record if obtained from local pharmacy or Health Dept. Verbalized acceptance and understanding.  Flu Vaccine status: Up to date  Pneumococcal vaccine status: Up to date  Covid-19 vaccine status: Declined, Education has been provided regarding the importance of this vaccine but patient still declined. Advised may receive this vaccine at local pharmacy or Health Dept.or vaccine clinic. Aware to provide a copy of the vaccination record if obtained from local pharmacy or Health Dept. Verbalized acceptance and  understanding.  Qualifies for Shingles Vaccine? Yes   Zostavax completed No   Shingrix Completed?: No.    Education has been provided regarding the importance of this vaccine. Patient has been advised to call insurance company to determine out of pocket expense if they have not yet received this vaccine. Advised may also receive vaccine at local pharmacy or Health Dept. Verbalized acceptance and understanding.  Screening Tests Health Maintenance  Topic Date Due   DTaP/Tdap/Td (1 - Tdap) Never done   COLONOSCOPY (Pts 45-78yrs Insurance coverage will need to be confirmed)  Never done   Zoster Vaccines- Shingrix (1 of 2) Never done   DEXA SCAN  Never done   Lung Cancer Screening  11/25/2022   Medicare Annual Wellness (AWV)  10/20/2023   MAMMOGRAM  03/15/2024   Pneumonia Vaccine 27+ Years old  Completed   INFLUENZA VACCINE  Completed   Hepatitis C Screening  Completed   HPV VACCINES  Aged Out   COVID-19 Vaccine  Discontinued    Health Maintenance  Health Maintenance Due  Topic Date Due   DTaP/Tdap/Td (1 - Tdap) Never done   COLONOSCOPY (Pts 45-70yrs Insurance coverage will need to be confirmed)  Never done   Zoster Vaccines- Shingrix (1 of 2) Never done   DEXA SCAN  Never done    Colorectal cancer screening: Referral to GI placed 2024. Pt aware the office will call re: appt.  Mammogram status: Completed 2023. Repeat every year  Bone density status: Patient given number to call and schedule  Lung Cancer Screening: (Low Dose CT Chest recommended if Age 64-80 years, 30 pack-year currently smoking OR have quit w/in 15years.) does qualify.   Lung Cancer Screening Referral: due 11/2022  Additional Screening:  Hepatitis C Screening: does not qualify; Completed 2022  Vision Screening: Recommended annual ophthalmology exams for early detection of glaucoma and other disorders of the eye. Is the patient up to date with their annual eye exam?  Yes  Who is the provider or what is the  name of the office in which the patient attends annual eye exams? [facility] If pt is not established with a provider, would they like to be referred to a provider to establish care? No .   Dental Screening: Recommended annual dental exams for proper oral hygiene  Community Resource Referral / Chronic Care Management: CRR required this visit?  No   CCM required this visit?  No      Plan:     I have personally reviewed and noted the following in the patient's chart:   Medical and social history Use of alcohol, tobacco or illicit drugs  Current medications and supplements including opioid prescriptions. Patient is not currently taking opioid prescriptions. Functional ability and status Nutritional status Physical activity Advanced directives List of other physicians Hospitalizations, surgeries, and ER visits in previous 12 months Vitals Screenings to include cognitive, depression, and falls Referrals and appointments  In addition, I have reviewed and discussed with patient certain preventive protocols, quality metrics, and best practice recommendations. A written personalized care plan for preventive services as well as general preventive health recommendations were provided to patient.     Smitty Knudsen, CMA   10/19/2022

## 2022-10-24 ENCOUNTER — Encounter: Payer: Self-pay | Admitting: *Deleted

## 2022-11-06 ENCOUNTER — Ambulatory Visit: Payer: Medicare HMO | Admitting: Family Medicine

## 2022-11-09 ENCOUNTER — Encounter: Payer: Self-pay | Admitting: Family Medicine

## 2022-11-17 ENCOUNTER — Ambulatory Visit: Payer: Medicare HMO | Admitting: Family Medicine

## 2022-11-23 ENCOUNTER — Encounter: Payer: Self-pay | Admitting: Radiology

## 2022-12-21 ENCOUNTER — Other Ambulatory Visit: Payer: Self-pay | Admitting: Internal Medicine

## 2022-12-27 DIAGNOSIS — F17210 Nicotine dependence, cigarettes, uncomplicated: Secondary | ICD-10-CM | POA: Diagnosis not present

## 2022-12-27 DIAGNOSIS — R69 Illness, unspecified: Secondary | ICD-10-CM | POA: Diagnosis not present

## 2022-12-27 DIAGNOSIS — J439 Emphysema, unspecified: Secondary | ICD-10-CM | POA: Diagnosis not present

## 2022-12-27 DIAGNOSIS — Z825 Family history of asthma and other chronic lower respiratory diseases: Secondary | ICD-10-CM | POA: Diagnosis not present

## 2022-12-27 DIAGNOSIS — I10 Essential (primary) hypertension: Secondary | ICD-10-CM | POA: Diagnosis not present

## 2022-12-27 DIAGNOSIS — Z91199 Patient's noncompliance with other medical treatment and regimen due to unspecified reason: Secondary | ICD-10-CM | POA: Diagnosis not present

## 2022-12-27 DIAGNOSIS — Z008 Encounter for other general examination: Secondary | ICD-10-CM | POA: Diagnosis not present

## 2022-12-27 DIAGNOSIS — I700 Atherosclerosis of aorta: Secondary | ICD-10-CM | POA: Diagnosis not present

## 2022-12-27 DIAGNOSIS — I739 Peripheral vascular disease, unspecified: Secondary | ICD-10-CM | POA: Diagnosis not present

## 2023-02-20 DIAGNOSIS — F17200 Nicotine dependence, unspecified, uncomplicated: Secondary | ICD-10-CM | POA: Diagnosis not present

## 2023-02-20 DIAGNOSIS — R636 Underweight: Secondary | ICD-10-CM | POA: Diagnosis not present

## 2023-02-20 DIAGNOSIS — J449 Chronic obstructive pulmonary disease, unspecified: Secondary | ICD-10-CM | POA: Diagnosis not present

## 2023-02-22 ENCOUNTER — Other Ambulatory Visit (HOSPITAL_COMMUNITY): Payer: Self-pay | Admitting: Gerontology

## 2023-02-22 ENCOUNTER — Other Ambulatory Visit (HOSPITAL_COMMUNITY)
Admission: RE | Admit: 2023-02-22 | Discharge: 2023-02-22 | Disposition: A | Discharge status: Home / Self Care | Payer: Medicare HMO | Source: Ambulatory Visit | Attending: Internal Medicine | Admitting: Internal Medicine

## 2023-02-22 ENCOUNTER — Ambulatory Visit (HOSPITAL_COMMUNITY)
Admission: RE | Admit: 2023-02-22 | Discharge: 2023-02-22 | Disposition: A | Discharge status: Home / Self Care | Payer: Medicare HMO | Source: Ambulatory Visit | Attending: Gerontology | Admitting: Gerontology

## 2023-02-22 DIAGNOSIS — R059 Cough, unspecified: Secondary | ICD-10-CM | POA: Diagnosis not present

## 2023-02-22 DIAGNOSIS — E038 Other specified hypothyroidism: Secondary | ICD-10-CM | POA: Insufficient documentation

## 2023-02-22 DIAGNOSIS — R636 Underweight: Secondary | ICD-10-CM | POA: Insufficient documentation

## 2023-02-22 DIAGNOSIS — R7301 Impaired fasting glucose: Secondary | ICD-10-CM | POA: Insufficient documentation

## 2023-02-22 DIAGNOSIS — J449 Chronic obstructive pulmonary disease, unspecified: Secondary | ICD-10-CM | POA: Diagnosis not present

## 2023-02-22 DIAGNOSIS — F17200 Nicotine dependence, unspecified, uncomplicated: Secondary | ICD-10-CM | POA: Insufficient documentation

## 2023-02-22 LAB — LIPID PANEL
Cholesterol: 171 mg/dL (ref 0–200)
HDL: 60 mg/dL (ref >40)
LDL Cholesterol: 99 mg/dL (ref 0–99)
Total CHOL/HDL Ratio: 2.9 RATIO
Triglycerides: 58 mg/dL (ref <150)
VLDL: 12 mg/dL (ref 0–40)

## 2023-02-22 LAB — CBC WITH DIFFERENTIAL/PLATELET
Abs Immature Granulocytes: 0.03 10*3/uL (ref 0.00–0.07)
Basophils Absolute: 0 10*3/uL (ref 0.0–0.1)
Basophils Relative: 0 %
Eosinophils Absolute: 0.1 10*3/uL (ref 0.0–0.5)
Eosinophils Relative: 1 %
HCT: 38.3 % (ref 36.0–46.0)
Hemoglobin: 12.6 g/dL (ref 12.0–15.0)
Immature Granulocytes: 0 %
Lymphocytes Relative: 42 %
Lymphs Abs: 3.1 10*3/uL (ref 0.7–4.0)
MCH: 24 pg — ABNORMAL LOW (ref 26.0–34.0)
MCHC: 32.9 g/dL (ref 30.0–36.0)
MCV: 73.1 fL — ABNORMAL LOW (ref 80.0–100.0)
Monocytes Absolute: 0.5 10*3/uL (ref 0.1–1.0)
Monocytes Relative: 6 %
Neutro Abs: 3.6 10*3/uL (ref 1.7–7.7)
Neutrophils Relative %: 51 %
Platelets: 317 10*3/uL (ref 150–400)
RBC: 5.24 MIL/uL — ABNORMAL HIGH (ref 3.87–5.11)
RDW: 15 % (ref 11.5–15.5)
WBC: 7.3 10*3/uL (ref 4.0–10.5)
nRBC: 0 % (ref 0.0–0.2)

## 2023-02-22 LAB — BASIC METABOLIC PANEL
Anion gap: 7 (ref 5–15)
BUN: 13 mg/dL (ref 8–23)
CO2: 24 mmol/L (ref 22–32)
Calcium: 9 mg/dL (ref 8.9–10.3)
Chloride: 105 mmol/L (ref 98–111)
Creatinine, Ser: 0.88 mg/dL (ref 0.44–1.00)
GFR, Estimated: >60 mL/min (ref >60)
Glucose, Bld: 95 mg/dL (ref 70–99)
Potassium: 4.2 mmol/L (ref 3.5–5.1)
Sodium: 136 mmol/L (ref 135–145)

## 2023-02-22 LAB — HEPATIC FUNCTION PANEL
ALT: 13 U/L (ref 0–44)
AST: 15 U/L (ref 15–41)
Albumin: 3.6 g/dL (ref 3.5–5.0)
Alkaline Phosphatase: 70 U/L (ref 38–126)
Bilirubin, Direct: <0.1 mg/dL (ref 0.0–0.2)
Total Bilirubin: 0.4 mg/dL (ref 0.3–1.2)
Total Protein: 7 g/dL (ref 6.5–8.1)

## 2023-03-27 ENCOUNTER — Encounter: Payer: Self-pay | Admitting: *Deleted

## 2023-05-24 ENCOUNTER — Other Ambulatory Visit (HOSPITAL_COMMUNITY): Payer: Self-pay | Admitting: Internal Medicine

## 2023-05-24 ENCOUNTER — Other Ambulatory Visit (HOSPITAL_COMMUNITY): Payer: Self-pay | Admitting: Gerontology

## 2023-05-24 DIAGNOSIS — Z7189 Other specified counseling: Secondary | ICD-10-CM | POA: Diagnosis not present

## 2023-05-24 DIAGNOSIS — J449 Chronic obstructive pulmonary disease, unspecified: Secondary | ICD-10-CM | POA: Diagnosis not present

## 2023-05-24 DIAGNOSIS — Z13820 Encounter for screening for osteoporosis: Secondary | ICD-10-CM

## 2023-05-24 DIAGNOSIS — R636 Underweight: Secondary | ICD-10-CM | POA: Diagnosis not present

## 2023-05-24 DIAGNOSIS — Z23 Encounter for immunization: Secondary | ICD-10-CM | POA: Diagnosis not present

## 2023-05-24 DIAGNOSIS — F17210 Nicotine dependence, cigarettes, uncomplicated: Secondary | ICD-10-CM | POA: Diagnosis not present

## 2023-05-24 DIAGNOSIS — Z1389 Encounter for screening for other disorder: Secondary | ICD-10-CM | POA: Diagnosis not present

## 2023-05-24 DIAGNOSIS — Z1331 Encounter for screening for depression: Secondary | ICD-10-CM | POA: Diagnosis not present

## 2023-05-24 DIAGNOSIS — Z0001 Encounter for general adult medical examination with abnormal findings: Secondary | ICD-10-CM | POA: Diagnosis not present

## 2023-05-24 DIAGNOSIS — Z1231 Encounter for screening mammogram for malignant neoplasm of breast: Secondary | ICD-10-CM

## 2023-05-24 DIAGNOSIS — F17200 Nicotine dependence, unspecified, uncomplicated: Secondary | ICD-10-CM | POA: Diagnosis not present

## 2023-05-29 ENCOUNTER — Other Ambulatory Visit (HOSPITAL_COMMUNITY): Payer: Medicare HMO

## 2023-05-29 ENCOUNTER — Encounter: Payer: Self-pay | Admitting: *Deleted

## 2023-05-31 ENCOUNTER — Ambulatory Visit (HOSPITAL_COMMUNITY)
Admission: RE | Admit: 2023-05-31 | Discharge: 2023-05-31 | Disposition: A | Discharge status: Home / Self Care | Payer: Medicare HMO | Source: Ambulatory Visit | Attending: Internal Medicine | Admitting: Internal Medicine

## 2023-05-31 ENCOUNTER — Ambulatory Visit (HOSPITAL_COMMUNITY)
Admission: RE | Admit: 2023-05-31 | Discharge: 2023-05-31 | Disposition: A | Discharge status: Home / Self Care | Payer: Medicare HMO | Source: Ambulatory Visit | Attending: Gerontology | Admitting: Gerontology

## 2023-05-31 ENCOUNTER — Ambulatory Visit (HOSPITAL_COMMUNITY): Payer: Medicare HMO

## 2023-05-31 ENCOUNTER — Encounter (HOSPITAL_COMMUNITY): Payer: Self-pay

## 2023-05-31 DIAGNOSIS — Z1231 Encounter for screening mammogram for malignant neoplasm of breast: Secondary | ICD-10-CM

## 2023-05-31 DIAGNOSIS — Z13820 Encounter for screening for osteoporosis: Secondary | ICD-10-CM | POA: Insufficient documentation

## 2023-05-31 DIAGNOSIS — M810 Age-related osteoporosis without current pathological fracture: Secondary | ICD-10-CM | POA: Insufficient documentation

## 2023-05-31 DIAGNOSIS — F17200 Nicotine dependence, unspecified, uncomplicated: Secondary | ICD-10-CM | POA: Insufficient documentation

## 2023-05-31 DIAGNOSIS — Z780 Asymptomatic menopausal state: Secondary | ICD-10-CM | POA: Insufficient documentation

## 2023-06-12 DIAGNOSIS — Z23 Encounter for immunization: Secondary | ICD-10-CM | POA: Diagnosis not present

## 2023-07-15 NOTE — Progress Notes (Deleted)
Stephanie Hendrix, female    DOB: 1955-10-25,    MRN: [id number]   Brief patient profile:  39 yobf active smoker retired cook  referred to pulmonary clinic in [place]  10/26/2021 by [facility] hospitalist  for ? Aecopd    Admit date: 10/22/2021   Discharge date: 10/23/2021   Admitted From:Home   Disposition:  Home   Recommendations for Outpatient Follow-up:  Follow up with PCP in 1-2 weeks Follow-up with pulmonology as scheduled in the next 1-2 weeks for COPD Continue prednisone as prescribed for 5 more days DuoNebs every 6 hours as needed for shortness of breath or wheezing Counseled on smoking cessation   Brief/Interim Summary:  Stephanie Hendrix is a 67 y.o. female with medical history significant for chronic tobacco abuse and suspected COPD who presented to the ED with cough and shortness of breath for the last 2 weeks.  She saw her PCP last Thursday and was prescribed an inhaler and was noted to have a negative COVID test.  She was admitted with acute hypoxemic respiratory failure secondary to COPD exacerbation with some bronchitis.  She was only requiring 2 L nasal cannula oxygen and was quickly weaned off.  She was started on doxycycline as well as inhaled and systemic steroids as well as breathing treatments with significant improvement the following day.  She states that she will work on smoking cessation and agrees to follow-up with pulmonology outpatient.  No other acute events noted throughout the course of this brief stay.  She has no home oxygen requirement on discharge.   Discharge Diagnoses:  Principal discharge diagnosis: Acute hypoxemic respiratory failure secondary to COPD exacerbation with bronchitis.  Ongoing tobacco abuse.   History of Present Illness  10/26/2021  Pulmonary/ 1st office eval/ Stephanie Hendrix / [facility]  Chief Complaint  Patient presents with   Consult    Hosp admission from  10/22/2021-10/23/2021.   Dyspnea:  baseline = no problem walking including hills an  hour at a time  then around mid Jan 2013 cough/ chest congestion/ wheeze > gradually worse x 2 weeks and  admit and back to ok with adls since d/c but not doing much walking yet.  Cough: none now  Sleep: ok flat bed  SABA use: 2puffs of albuterol this am / has neb not used today  Rec Plan A = Automatic = Always=    Breztri up to 2 pffs every 12 hours  Work on inhaler technique:   Plan B = Backup (to supplement plan A, not to replace it) Only use your albuterol inhaler as a rescue medication  Plan C = Crisis (instead of Plan B but only if Plan B stops working) - only use your albuterol nebulizer if you first try Plan B and it fails to help Congratulations on stopping smoking - most important aspect of your care     03/16/2022  f/u ov/[place] office/Stephanie Hendrix re: AB maint on Breztri and normally not using neb but uses saba p ex  Chief Complaint  Patient presents with   Follow-up    Patient states she was sick for a week other than that patient states she was improving.   Dyspnea:  really not much change with doe unless cough Cough: smoker's rattle  but improving / was yellow clear p rx since 6/19 /23 with augmentin/ dox Sleeping: does fine then cough  worse an hour p stirring  SABA WUJ:WJXB neb this week but less than prior to breztri 02: none  Covid status:  vax x 3  Lung cancer screening: on program  Rec Work on perfecting  inhaler technique:    - remember to take practice breaths with empty inhaler  For cough > mucinex dm 1200 mg every 12 hours as needed Try prilosec otc 20mg   Take 30-60 min before first meal of the day and Pepcid ac (famotidine) 20 mg one @  bedtime until cough is completely gone for at least a week     The key is to stop smoking completely before smoking completely stops you!   Please schedule a follow up visit in 3-4  months but call sooner if needed with PFTs on return > not done    07/03/2022  f/u ov/[place] office/Stephanie Hendrix re: AB maint on Breztri  2 bid prn   Chief Complaint  Patient presents with   Follow-up    Breathing has greatly improved since last ov.   Dyspnea:  Not limited by breathing from desired activities  / same pace as others Cough: still smoker's rattle Sleeping: ok flat  SABA use: hardly at all  02: none  Lung cancer screening: per PCP  Rec Plan A = Automatic = Always=    Breztri up to 2 puffs every 12 hours  Work on inhaler technique:   - remember how golfers warm up and use empty cannister to train on Plan B = Backup (to supplement plan A, not to replace it) Only use your albuterol inhaler as a rescue medication My office will be contacting you by phone for referral for PFTs at [facility]  > not done by 07/16/2023  - if you don't hear back from my office within one week please call us back or notify us thru MyChart and we'll address it right away.      07/16/2023  f/u ov/ office/Stephanie Hendrix re: AB maint on *** needs pfts*** No chief complaint on file.   Dyspnea:  *** Cough: *** Sleeping: ***   resp cc  SABA use: *** 02: ***  Lung cancer screening: ***   No obvious day to day or daytime variability or assoc excess/ purulent sputum or mucus plugs or hemoptysis or cp or chest tightness, subjective wheeze or overt sinus or hb symptoms.    Also denies any obvious fluctuation of symptoms with weather or environmental changes or other aggravating or alleviating factors except as outlined above   No unusual exposure hx or h/o childhood pna/ asthma or knowledge of premature birth.  Current Allergies, Complete Past Medical History, Past Surgical History, Family History, and Social History were reviewed in [employer] record.  ROS  The following are not active complaints unless bolded Hoarseness, sore throat, dysphagia, dental problems, itching, sneezing,  nasal congestion or discharge of excess mucus or purulent secretions, ear ache,   fever, chills, sweats, unintended wt loss or wt gain,  classically pleuritic or exertional cp,  orthopnea pnd or arm/hand swelling  or leg swelling, presyncope, palpitations, abdominal pain, anorexia, nausea, vomiting, diarrhea  or change in bowel habits or change in bladder habits, change in stools or change in urine, dysuria, hematuria,  rash, arthralgias, visual complaints, headache, numbness, weakness or ataxia or problems with walking or coordination,  change in mood or  memory.        No outpatient medications have been marked as taking for the 07/16/23 encounter (Appointment) with Stephanie Cowden, MD.             Past Medical History:  Diagnosis Date  Allergy    Syncope    Tobacco abuse 12/22/2016      Objective:    Wts  07/16/2023      ***  07/03/2022       114  03/16/2022       118   03/01/22 119 lb (54 kg)  11/01/21 127 lb 1.3 oz (57.6 kg)  10/26/21 126 lb 1.9 oz (57.2 kg)    Vital signs reviewed  07/16/2023  - Note at rest 02 sats  ***% on ***   General appearance:    ***     Mild barr***            Assessment

## 2023-07-16 ENCOUNTER — Ambulatory Visit: Payer: Medicare HMO | Admitting: Internal Medicine

## 2023-07-16 ENCOUNTER — Encounter: Payer: Self-pay | Admitting: Internal Medicine

## 2023-08-02 ENCOUNTER — Encounter: Payer: Self-pay | Admitting: Internal Medicine

## 2023-08-02 ENCOUNTER — Ambulatory Visit: Payer: Medicare HMO | Admitting: Internal Medicine

## 2023-08-02 NOTE — Progress Notes (Deleted)
Stephanie Hendrix, female    DOB: [birth year],    MRN: [id number]   Brief patient profile:  30 yobf active smoker retired cook  referred to pulmonary clinic in [place]  10/26/2021 by [facility] hospitalist  for ? Aecopd    Admit date: 10/22/2021   Discharge date: 10/23/2021   Admitted From:Home   Disposition:  Home   Recommendations for Outpatient Follow-up:  Follow up with PCP in 1-2 weeks Follow-up with pulmonology as scheduled in the next 1-2 weeks for COPD Continue prednisone as prescribed for 5 more days DuoNebs every 6 hours as needed for shortness of breath or wheezing Counseled on smoking cessation   Brief/Interim Summary:  Stephanie Hendrix is a 67 y.o. female with medical history significant for chronic tobacco abuse and suspected COPD who presented to the ED with cough and shortness of breath for the last 2 weeks.  She saw her PCP last Thursday and was prescribed an inhaler and was noted to have a negative COVID test.  She was admitted with acute hypoxemic respiratory failure secondary to COPD exacerbation with some bronchitis.  She was only requiring 2 L nasal cannula oxygen and was quickly weaned off.  She was started on doxycycline as well as inhaled and systemic steroids as well as breathing treatments with significant improvement the following day.  She states that she will work on smoking cessation and agrees to follow-up with pulmonology outpatient.  No other acute events noted throughout the course of this brief stay.  She has no home oxygen requirement on discharge.   Discharge Diagnoses:  Principal discharge diagnosis: Acute hypoxemic respiratory failure secondary to COPD exacerbation with bronchitis.  Ongoing tobacco abuse.   History of Present Illness  10/26/2021  Pulmonary/ 1st office eval/ Sherene Sires / [facility]  Chief Complaint  Patient presents with   Consult    Hosp admission from  10/22/2021-10/23/2021.   Dyspnea:  baseline = no problem walking including hills an  hour at a time  then around mid Jan 2013 cough/ chest congestion/ wheeze > gradually worse x 2 weeks and  admit and back to ok with adls since d/c but not doing much walking yet.  Cough: none now  Sleep: ok flat bed  SABA use: 2puffs of albuterol this am / has neb not used today  Rec Plan A = Automatic = Always=    Breztri up to 2 pffs every 12 hours  Work on inhaler technique:   Plan B = Backup (to supplement plan A, not to replace it) Only use your albuterol inhaler as a rescue medication  Plan C = Crisis (instead of Plan B but only if Plan B stops working) - only use your albuterol nebulizer if you first try Plan B and it fails to help Congratulations on stopping smoking - most important aspect of your care     03/16/2022  f/u ov/[place] office/Meliya Mcconahy re: AB maint on Breztri and normally not using neb but uses saba p ex  Chief Complaint  Patient presents with   Follow-up    Patient states she was sick for a week other than that patient states she was improving.   Dyspnea:  really not much change with doe unless cough Cough: smoker's rattle  but improving / was yellow clear p rx since 6/19 /23 with augmentin/ dox Sleeping: does fine then cough  worse an hour p stirring  SABA QIO:NGEX neb this week but less than prior to breztri 02: none  Covid status:  vax x 3  Lung cancer screening: on program  Rec Work on perfecting  inhaler technique:    - remember to take practice breaths with empty inhaler  For cough > mucinex dm 1200 mg every 12 hours as needed Try prilosec otc 20mg   Take 30-60 min before first meal of the day and Pepcid ac (famotidine) 20 mg one @  bedtime until cough is completely gone for at least a week     The key is to stop smoking completely before smoking completely stops you!   Please schedule a follow up visit in 3-4  months but call sooner if needed with PFTs on return > not done    07/03/2022  f/u ov/[place] office/Mj Willis re: AB maint on Breztri  2 bid prn   Chief Complaint  Patient presents with   Follow-up    Breathing has greatly improved since last ov.   Dyspnea:  Not limited by breathing from desired activities  / same pace as others Cough: still smoker's rattle Sleeping: ok flat  SABA use: hardly at all  02: none  Lung cancer screening: per PCP  Rec Plan A = Automatic = Always=    Breztri up to 2 puffs every 12 hours  Work on inhaler technique:   - remember how golfers warm up and use empty cannister to train on Plan B = Backup (to supplement plan A, not to replace it) Only use your albuterol inhaler as a rescue medication My office will be contacting you by phone for referral for PFTs at [facility]  > not done by 08/02/2023  - if you don't hear back from my office within one week please call us back or notify us thru MyChart and we'll address it right away.      08/02/2023  f/u ov/ office/Kendrell Lottman re: AB maint on *** needs pfts*** No chief complaint on file.   Dyspnea:  *** Cough: *** Sleeping: ***   resp cc  SABA use: *** 02: ***  Lung cancer screening: ***   No obvious day to day or daytime variability or assoc excess/ purulent sputum or mucus plugs or hemoptysis or cp or chest tightness, subjective wheeze or overt sinus or hb symptoms.    Also denies any obvious fluctuation of symptoms with weather or environmental changes or other aggravating or alleviating factors except as outlined above   No unusual exposure hx or h/o childhood pna/ asthma or knowledge of premature birth.  Current Allergies, Complete Past Medical History, Past Surgical History, Family History, and Social History were reviewed in [employer] record.  ROS  The following are not active complaints unless bolded Hoarseness, sore throat, dysphagia, dental problems, itching, sneezing,  nasal congestion or discharge of excess mucus or purulent secretions, ear ache,   fever, chills, sweats, unintended wt loss or wt gain,  classically pleuritic or exertional cp,  orthopnea pnd or arm/hand swelling  or leg swelling, presyncope, palpitations, abdominal pain, anorexia, nausea, vomiting, diarrhea  or change in bowel habits or change in bladder habits, change in stools or change in urine, dysuria, hematuria,  rash, arthralgias, visual complaints, headache, numbness, weakness or ataxia or problems with walking or coordination,  change in mood or  memory.        No outpatient medications have been marked as taking for the 08/02/23 encounter (Appointment) with Nyoka Cowden, MD.             Past Medical History:  Diagnosis Date  Allergy    Syncope    Tobacco abuse 12/22/2016      Objective:    Wts  08/02/2023      ***  07/03/2022       114  03/16/2022       118   03/01/22 119 lb (54 kg)  11/01/21 127 lb 1.3 oz (57.6 kg)  10/26/21 126 lb 1.9 oz (57.2 kg)    Vital signs reviewed  08/02/2023  - Note at rest 02 sats  ***% on ***   General appearance:    ***     Mild barr***            Assessment

## 2023-11-27 ENCOUNTER — Encounter (INDEPENDENT_AMBULATORY_CARE_PROVIDER_SITE_OTHER): Payer: Self-pay | Admitting: *Deleted

## 2023-12-31 DIAGNOSIS — J449 Chronic obstructive pulmonary disease, unspecified: Secondary | ICD-10-CM | POA: Diagnosis not present

## 2023-12-31 DIAGNOSIS — F17200 Nicotine dependence, unspecified, uncomplicated: Secondary | ICD-10-CM | POA: Diagnosis not present

## 2023-12-31 DIAGNOSIS — M80032A Age-related osteoporosis with current pathological fracture, left forearm, initial encounter for fracture: Secondary | ICD-10-CM | POA: Diagnosis not present

## 2023-12-31 DIAGNOSIS — R636 Underweight: Secondary | ICD-10-CM | POA: Diagnosis not present

## 2024-04-05 ENCOUNTER — Emergency Department (HOSPITAL_COMMUNITY)

## 2024-04-05 ENCOUNTER — Encounter (HOSPITAL_COMMUNITY): Payer: Self-pay

## 2024-04-05 ENCOUNTER — Other Ambulatory Visit: Payer: Self-pay

## 2024-04-05 ENCOUNTER — Inpatient Hospital Stay (HOSPITAL_COMMUNITY)
Admission: EM | Admit: 2024-04-05 | Discharge: 2024-04-07 | DRG: 189 | Disposition: A | Discharge status: Home / Self Care | Attending: Internal Medicine | Admitting: Internal Medicine

## 2024-04-05 DIAGNOSIS — Z833 Family history of diabetes mellitus: Secondary | ICD-10-CM

## 2024-04-05 DIAGNOSIS — Z7951 Long term (current) use of inhaled steroids: Secondary | ICD-10-CM | POA: Diagnosis not present

## 2024-04-05 DIAGNOSIS — Z9049 Acquired absence of other specified parts of digestive tract: Secondary | ICD-10-CM | POA: Diagnosis not present

## 2024-04-05 DIAGNOSIS — Z859 Personal history of malignant neoplasm, unspecified: Secondary | ICD-10-CM | POA: Diagnosis not present

## 2024-04-05 DIAGNOSIS — Z825 Family history of asthma and other chronic lower respiratory diseases: Secondary | ICD-10-CM

## 2024-04-05 DIAGNOSIS — Z79899 Other long term (current) drug therapy: Secondary | ICD-10-CM | POA: Diagnosis not present

## 2024-04-05 DIAGNOSIS — J441 Chronic obstructive pulmonary disease with (acute) exacerbation: Principal | ICD-10-CM | POA: Diagnosis present

## 2024-04-05 DIAGNOSIS — Z8249 Family history of ischemic heart disease and other diseases of the circulatory system: Secondary | ICD-10-CM

## 2024-04-05 DIAGNOSIS — I700 Atherosclerosis of aorta: Secondary | ICD-10-CM | POA: Diagnosis not present

## 2024-04-05 DIAGNOSIS — J9602 Acute respiratory failure with hypercapnia: Secondary | ICD-10-CM | POA: Diagnosis not present

## 2024-04-05 DIAGNOSIS — Z881 Allergy status to other antibiotic agents status: Secondary | ICD-10-CM

## 2024-04-05 DIAGNOSIS — Z803 Family history of malignant neoplasm of breast: Secondary | ICD-10-CM

## 2024-04-05 DIAGNOSIS — F17210 Nicotine dependence, cigarettes, uncomplicated: Secondary | ICD-10-CM | POA: Diagnosis present

## 2024-04-05 DIAGNOSIS — Z7983 Long term (current) use of bisphosphonates: Secondary | ICD-10-CM

## 2024-04-05 DIAGNOSIS — Z811 Family history of alcohol abuse and dependence: Secondary | ICD-10-CM

## 2024-04-05 DIAGNOSIS — J9601 Acute respiratory failure with hypoxia: Principal | ICD-10-CM | POA: Diagnosis present

## 2024-04-05 DIAGNOSIS — R0602 Shortness of breath: Secondary | ICD-10-CM | POA: Diagnosis not present

## 2024-04-05 DIAGNOSIS — J449 Chronic obstructive pulmonary disease, unspecified: Secondary | ICD-10-CM | POA: Diagnosis not present

## 2024-04-05 DIAGNOSIS — Z720 Tobacco use: Secondary | ICD-10-CM | POA: Diagnosis present

## 2024-04-05 LAB — CBC WITH DIFFERENTIAL/PLATELET
Abs Immature Granulocytes: 0.1 K/uL — ABNORMAL HIGH (ref 0.00–0.07)
Basophils Absolute: 0 K/uL (ref 0.0–0.1)
Basophils Relative: 0 %
Eosinophils Absolute: 0.3 K/uL (ref 0.0–0.5)
Eosinophils Relative: 4 %
HCT: 40.3 % (ref 36.0–46.0)
Hemoglobin: 13 g/dL (ref 12.0–15.0)
Lymphocytes Relative: 47 %
Lymphs Abs: 3.1 K/uL (ref 0.7–4.0)
MCH: 23.9 pg — ABNORMAL LOW (ref 26.0–34.0)
MCHC: 32.3 g/dL (ref 30.0–36.0)
MCV: 74.1 fL — ABNORMAL LOW (ref 80.0–100.0)
Metamyelocytes Relative: 1 %
Monocytes Absolute: 0.2 K/uL (ref 0.1–1.0)
Monocytes Relative: 3 %
Neutro Abs: 3 K/uL (ref 1.7–7.7)
Neutrophils Relative %: 45 %
Platelets: 239 K/uL (ref 150–400)
RBC: 5.44 MIL/uL — ABNORMAL HIGH (ref 3.87–5.11)
RDW: 15.5 % (ref 11.5–15.5)
WBC: 6.6 K/uL (ref 4.0–10.5)
nRBC: 0 % (ref 0.0–0.2)

## 2024-04-05 LAB — BASIC METABOLIC PANEL WITH GFR
Anion gap: 12 (ref 5–15)
BUN: 8 mg/dL (ref 8–23)
CO2: 25 mmol/L (ref 22–32)
Calcium: 8.9 mg/dL (ref 8.9–10.3)
Chloride: 102 mmol/L (ref 98–111)
Creatinine, Ser: 0.81 mg/dL (ref 0.44–1.00)
GFR, Estimated: >60 mL/min (ref >60)
Glucose, Bld: 124 mg/dL — ABNORMAL HIGH (ref 70–99)
Potassium: 4 mmol/L (ref 3.5–5.1)
Sodium: 139 mmol/L (ref 135–145)

## 2024-04-05 LAB — BLOOD GAS, VENOUS
Acid-Base Excess: 1.1 mmol/L (ref 0.0–2.0)
Bicarbonate: 28.3 mmol/L — ABNORMAL HIGH (ref 20.0–28.0)
Drawn by: 67355
O2 Saturation: 45.1 %
Patient temperature: 36.4
pCO2, Ven: 54 mmHg (ref 44–60)
pH, Ven: 7.33 (ref 7.25–7.43)
pO2, Ven: <31 mmHg — CL (ref 32–45)

## 2024-04-05 LAB — RESP PANEL BY RT-PCR (RSV, FLU A&B, COVID)  RVPGX2
Influenza A by PCR: NEGATIVE
Influenza B by PCR: NEGATIVE
Resp Syncytial Virus by PCR: NEGATIVE
SARS Coronavirus 2 by RT PCR: NEGATIVE

## 2024-04-05 MED ORDER — IPRATROPIUM BROMIDE 0.02 % IN SOLN
RESPIRATORY_TRACT | Status: AC
  Administered 2024-04-05: 1 mg
  Filled 2024-04-05: qty 5

## 2024-04-05 MED ORDER — METHYLPREDNISOLONE SODIUM SUCC 125 MG IJ SOLR
125.0000 mg | Freq: Once | INTRAMUSCULAR | Status: AC
  Administered 2024-04-05: 125 mg via INTRAVENOUS
  Filled 2024-04-05: qty 2

## 2024-04-05 MED ORDER — BUDESONIDE 0.5 MG/2ML IN SUSP
0.5000 mg | Freq: Two times a day (BID) | RESPIRATORY_TRACT | Status: DC
  Administered 2024-04-05 – 2024-04-07 (×5): 0.5 mg via RESPIRATORY_TRACT
  Filled 2024-04-05 (×5): qty 2

## 2024-04-05 MED ORDER — PROCHLORPERAZINE EDISYLATE 10 MG/2ML IJ SOLN: 10.0000 mg | Freq: Four times a day (QID) | INTRAMUSCULAR | Status: DC | PRN

## 2024-04-05 MED ORDER — IPRATROPIUM BROMIDE 0.02 % IN SOLN
RESPIRATORY_TRACT | Status: AC
  Filled 2024-04-05: qty 5

## 2024-04-05 MED ORDER — ACETAMINOPHEN 500 MG PO TABS
1000.0000 mg | ORAL_TABLET | Freq: Three times a day (TID) | ORAL | Status: DC
  Administered 2024-04-05 – 2024-04-07 (×7): 1000 mg via ORAL
  Filled 2024-04-05 (×7): qty 2

## 2024-04-05 MED ORDER — IPRATROPIUM-ALBUTEROL 0.5-2.5 (3) MG/3ML IN SOLN
3.0000 mL | Freq: Four times a day (QID) | RESPIRATORY_TRACT | Status: AC
  Administered 2024-04-05 – 2024-04-07 (×7): 3 mL via RESPIRATORY_TRACT
  Filled 2024-04-05 (×8): qty 3

## 2024-04-05 MED ORDER — METHYLPREDNISOLONE SODIUM SUCC 40 MG IJ SOLR
40.0000 mg | Freq: Two times a day (BID) | INTRAMUSCULAR | Status: DC
  Administered 2024-04-06 (×2): 40 mg via INTRAVENOUS
  Filled 2024-04-05 (×3): qty 1

## 2024-04-05 MED ORDER — ALBUTEROL SULFATE (2.5 MG/3ML) 0.083% IN NEBU
INHALATION_SOLUTION | RESPIRATORY_TRACT | Status: AC
  Administered 2024-04-05: 2.5 mg
  Filled 2024-04-05: qty 12

## 2024-04-05 MED ORDER — ENOXAPARIN SODIUM 40 MG/0.4ML IJ SOSY
40.0000 mg | PREFILLED_SYRINGE | INTRAMUSCULAR | Status: DC
  Administered 2024-04-05 – 2024-04-06 (×2): 40 mg via SUBCUTANEOUS
  Filled 2024-04-05 (×2): qty 0.4

## 2024-04-05 MED ORDER — ARFORMOTEROL TARTRATE 15 MCG/2ML IN NEBU
15.0000 ug | INHALATION_SOLUTION | Freq: Two times a day (BID) | RESPIRATORY_TRACT | Status: DC
  Administered 2024-04-05 – 2024-04-07 (×5): 15 ug via RESPIRATORY_TRACT
  Filled 2024-04-05 (×5): qty 2

## 2024-04-05 MED ORDER — ALBUTEROL SULFATE (2.5 MG/3ML) 0.083% IN NEBU
INHALATION_SOLUTION | RESPIRATORY_TRACT | Status: AC
  Administered 2024-04-05: 7.5 mg
  Filled 2024-04-05: qty 12

## 2024-04-05 NOTE — Progress Notes (Signed)
   04/05/24 0545  Assess: MEWS Score  Pulse Rate (!) 55  Resp (!) 28  SpO2 97 %  O2 Device Nasal Cannula  O2 Flow Rate (L/min) 2 L/min  Assess: MEWS Score  MEWS Temp 0  MEWS Systolic 0  MEWS Pulse 0  MEWS RR 2  MEWS LOC 0  MEWS Score 2  MEWS Score Color Yellow  Assess: if the MEWS score is Yellow or Red  Were vital signs accurate and taken at a resting state? Yes  Does the patient meet 2 or more of the SIRS criteria? Yes  Notify: Charge Nurse/RN  Name of Charge Nurse/RN Notified Metta Sar, RN  Provider Notification  Provider Name/Title Dr. Manfred  Date Provider Notified 04/05/24  Time Provider Notified [phone]  Notification Reason Other (Comment) (increase in mews)  Assess: SIRS CRITERIA  SIRS Temperature  0  SIRS Respirations  1  SIRS Pulse 0  SIRS WBC 0  SIRS Score Sum  1

## 2024-04-05 NOTE — ED Triage Notes (Signed)
 POV from home. Cc of SOB for a couple weeks but worse tonight Upon arrival patient was purse lip breathing and diaphoretic. 87-88% on room air.  Placed on 2l [place] and now 93%.  Used 1 neb at home.  +cough couple weeks. +cigarette smoker

## 2024-04-05 NOTE — ED Notes (Addendum)
 Lab called with critical at this time

## 2024-04-05 NOTE — ED Provider Notes (Signed)
 [place] EMERGENCY DEPARTMENT AT [facility] Provider Note   CSN: [id number] Arrival date & time: 04/05/24  0044     Patient presents with: Shortness of Breath   Stephanie Hendrix is a 68 y.o. female.   Patient with history of COPD presents to the emergency department for evaluation of difficulty breathing.  She has been having more difficulty than usual over the last couple of weeks but symptoms significantly worsened tonight.  Patient does report cough and minimal response to nebulizers.  No fever.       Prior to Admission medications   Medication Sig Start Date End Date Taking? Authorizing Provider  albuterol  (VENTOLIN  HFA) 108 (90 Base) MCG/ACT inhaler Inhale 2 puffs into the lungs every 6 (six) hours as needed for wheezing or shortness of breath. 10/19/21   Paseda, Folashade R, FNP  alendronate (FOSAMAX) 70 MG tablet Take 70 mg by mouth once a week. 06/06/23   [provider]  Budeson-Glycopyrrol-Formoterol (BREZTRI  AEROSPHERE) 160-9-4.8 MCG/ACT AERO TAKE 2 PUFFS BY MOUTH TWICE DAILY (IN THE MORNING AND 12 HOURS LATER). 12/25/22   Darlean Ozell NOVAK, MD  ibuprofen (ADVIL) 800 MG tablet Take 800 mg by mouth every 8 (eight) hours as needed. 08/01/21   [provider]  ipratropium-albuterol  (DUONEB) 0.5-2.5 (3) MG/3ML SOLN Take 3 mLs by nebulization every 6 (six) hours as needed (for shortness of breath or wheezing). 10/23/21 11/22/21  Maree, Pratik D, DO  TRELEGY ELLIPTA 100-62.5-25 MCG/ACT AEPB Inhale 1 puff into the lungs daily. 07/12/23   [provider]  Vitamin D , Ergocalciferol , (DRISDOL ) 1.25 MG (50000 UNIT) CAPS capsule Take 1 capsule (50,000 Units total) by mouth every 7 (seven) days. 03/16/22   Paseda, Folashade R, FNP    Allergies: Neomycin    Review of Systems  Updated Vital Signs BP 96/83   Pulse 60   Temp (!) 97.5 F (36.4 C) (Oral)   Resp 20   Ht 5' 6 (1.676 m)   Wt 54 kg   SpO2 100%   BMI 19.21 kg/m   Physical Exam Vitals  and nursing note reviewed.  Constitutional:      General: She is not in acute distress.    Appearance: She is well-developed.  HENT:     Head: Normocephalic and atraumatic.     Mouth/Throat:     Mouth: Mucous membranes are moist.  Eyes:     General: Vision grossly intact. Gaze aligned appropriately.     Extraocular Movements: Extraocular movements intact.     Conjunctiva/sclera: Conjunctivae normal.  Cardiovascular:     Rate and Rhythm: Normal rate and regular rhythm.     Pulses: Normal pulses.     Heart sounds: Normal heart sounds, S1 normal and S2 normal. No murmur heard.    No friction rub. No gallop.  Pulmonary:     Effort: Tachypnea and accessory muscle usage present.     Breath sounds: Decreased breath sounds and wheezing present.  Abdominal:     General: Bowel sounds are normal.     Palpations: Abdomen is soft.     Tenderness: There is no abdominal tenderness. There is no guarding or rebound.     Hernia: No hernia is present.  Musculoskeletal:        General: No swelling.     Cervical back: Full passive range of motion without pain, normal range of motion and neck supple. No spinous process tenderness or muscular tenderness. Normal range of motion.     Right lower  leg: No edema.     Left lower leg: No edema.  Skin:    General: Skin is warm and dry.     Capillary Refill: Capillary refill takes less than 2 seconds.     Findings: No ecchymosis, erythema, rash or wound.  Neurological:     General: No focal deficit present.     Mental Status: She is alert and oriented to person, place, and time.     GCS: GCS eye subscore is 4. GCS verbal subscore is 5. GCS motor subscore is 6.     Cranial Nerves: Cranial nerves 2-12 are intact.     Sensory: Sensation is intact.     Motor: Motor function is intact.     Coordination: Coordination is intact.  Psychiatric:        Attention and Perception: Attention normal.        Mood and Affect: Mood normal.        Speech: Speech normal.         Behavior: Behavior normal.     (all labs ordered are listed, but only abnormal results are displayed) Labs Reviewed  CBC WITH DIFFERENTIAL/PLATELET - Abnormal; Notable for the following components:      Result Value   RBC 5.44 (*)    MCV 74.1 (*)    MCH 23.9 (*)    Abs Immature Granulocytes 0.10 (*)    All other components within normal limits  BASIC METABOLIC PANEL WITH GFR - Abnormal; Notable for the following components:   Glucose, Bld 124 (*)    All other components within normal limits  BLOOD GAS, VENOUS - Abnormal; Notable for the following components:   pO2, Ven <31 (*)    Bicarbonate 28.3 (*)    All other components within normal limits    EKG: EKG Interpretation Date/Time:  Saturday April 05 2024 01:18:40 EDT Ventricular Rate:  58 PR Interval:  202 QRS Duration:  79 QT Interval:  476 QTC Calculation: 468 R Axis:   84  Text Interpretation: Sinus rhythm Right atrial enlargement Borderline right axis deviation No significant change since last tracing Confirmed by Haze Lonni PARAS ([id number]) on 04/05/2024 1:48:07 AM  Radiology: [facility] Chest Port 1 View Result Date: 04/05/2024 CLINICAL DATA:  Shortness of breath for couple weeks. EXAM: PORTABLE CHEST 1 VIEW COMPARISON:  02/22/2023 FINDINGS: Stable cardiomediastinal silhouette. Aortic atherosclerotic calcification. Hyperinflation and chronic bronchitic change. No focal consolidation, pleural effusion, or pneumothorax. No displaced rib fractures. IMPRESSION: No active disease. COPD. Electronically Signed   By: Norman Gatlin M.D.   On: 04/05/2024 01:30     Procedures   Medications Ordered in the ED  ipratropium (ATROVENT ) 0.02 % nebulizer solution (  Not Given 04/05/24 0111)  albuterol  (PROVENTIL ) (2.5 MG/3ML) 0.083% nebulizer solution (2.5 mg  Given 04/05/24 0110)  ipratropium (ATROVENT ) 0.02 % nebulizer solution (1 mg  Given 04/05/24 0109)  albuterol  (PROVENTIL ) (2.5 MG/3ML) 0.083% nebulizer solution (7.5 mg  Given  04/05/24 0110)                                    Medical Decision Making Amount and/or Complexity of Data Reviewed Labs: ordered. Radiology: ordered.   Differential Diagnosis considered includes, but not limited to: COPD exacerbation; Bronchitis; Pneumonia; CHF; ACS; PE  Presents to the emergency department for evaluation of difficulty breathing.  She has not been feeling well for several weeks but tonight significantly worsened.  Patient  with cough, chest congestion and shortness of breath.  She arrives by POV, was in distress at arrival with tripoding and pursed lip breathing.  Patient's air movement was poor with wheezing.  Patient treated with Solu-Medrol , bronchodilator therapy and has improved.  She is still requiring supplemental oxygen by nasal cannula but looks much more comfortable.  Chest x-ray clear, no pneumonia or signs of heart failure.  Patient will require hospitalization for further management of COPD exacerbation.  CRITICAL CARE Performed by: Lonni JINNY Seats   Total critical care time: 30 minutes  Critical care time was exclusive of separately billable procedures and treating other patients.  Critical care was necessary to treat or prevent imminent or life-threatening deterioration.  Critical care was time spent personally by me on the following activities: development of treatment plan with patient and/or surrogate as well as nursing, discussions with consultants, evaluation of patient's response to treatment, examination of patient, obtaining history from patient or surrogate, ordering and performing treatments and interventions, ordering and review of laboratory studies, ordering and review of radiographic studies, pulse oximetry and re-evaluation of patient's condition.      Final diagnoses:  COPD exacerbation (HCC)  Acute respiratory failure with hypoxia [facility])    ED Discharge Orders     None          Sihaam Chrobak, Lonni JINNY, MD 04/05/24  0246

## 2024-04-05 NOTE — Care Management Obs Status (Signed)
 MEDICARE OBSERVATION STATUS NOTIFICATION   Patient Details  Name: Stephanie Hendrix MRN: [id number] Date of Birth: 20-May-1956   Medicare Observation Status Notification Given:  Yes    Nena LITTIE Coffee, RN 04/05/2024, 4:50 PM

## 2024-04-05 NOTE — Plan of Care (Signed)

## 2024-04-05 NOTE — Progress Notes (Signed)
   04/05/24 1651  TOC Brief Assessment  Insurance and Status Reviewed  Patient has primary care physician Yes  Home environment has been reviewed From home  Prior level of function: Independent  Prior/Current Home Services No current home services  Social Drivers of Health Review SDOH reviewed interventions complete (smoking cessation added to AVS)  Readmission risk has been reviewed Yes  Transition of care needs no transition of care needs at this time   Transition of Care Department [facility]) has reviewed patient and no TOC needs have been identified at this time. We will continue to monitor patient advancement through interdisciplinary progression rounds. If new patient transition needs arise, please place a TOC consult.

## 2024-04-05 NOTE — Hospital Course (Signed)
 68 year old female with a history of tobacco abuse and COPD presenting with 2-week history of shortness of breath that significantly worsened in the past 24 hours prior to admission.  She complains of a largely nonproductive cough.  She denies any fevers, chills, hemoptysis, nausea, vomiting, diarrhea, domino pain.  She does have some right-sided chest pain worsening with coughing.  She denies any diarrhea, hematochezia, melena, dysuria.  She continues to smoke up to 2 packs/day.  She has over 100-pack-year history.  She denies any illicit drugs.  She was using her nebulizer machine at home without much improvement.  As result she presented for further evaluation and treatment.  In the ED, the patient was afebrile and hemodynamically stable with oxygen saturation 88% room air.  She is now placed on 4 L with saturation 95-97%.  WBC 6.6, hemoglobin 13.0, platelets 239.  Sodium 139, potassium 4.0, bicarbonate 25, serum creatinine 0.81.  EKG showed sinus rhythm without any ST-T wave changes.  VBG showed 7.33/54/<31 HCO3 28 Chest x-ray showed chronic bronchitic changes.  The patient was started on bronchodilators and IV Solu-Medrol 

## 2024-04-05 NOTE — H&P (Signed)
 History and Physical    Patient: Stephanie Hendrix FMW:[id number] DOB: 02/25/1956 DOA: 04/05/2024 DOS: the patient was seen and examined on 04/05/2024 PCP: Carlette Benita Area, MD  Patient coming from: Home  Chief Complaint:  Chief Complaint  Patient presents with   Shortness of Breath   HPI: Stephanie Hendrix is a 68 year old female with a history of tobacco abuse and COPD presenting with 2-week history of shortness of breath that significantly worsened in the past 24 hours prior to admission.  She complains of a largely nonproductive cough.  She denies any fevers, chills, hemoptysis, nausea, vomiting, diarrhea, domino pain.  She does have some right-sided chest pain worsening with coughing.  She denies any diarrhea, hematochezia, melena, dysuria.  She continues to smoke up to 2 packs/day.  She has over 100-pack-year history.  She denies any illicit drugs.  She was using her nebulizer machine at home without much improvement.  As result she presented for further evaluation and treatment.  In the ED, the patient was afebrile and hemodynamically stable with oxygen saturation 88% room air.  She is now placed on 4 L with saturation 95-97%.  WBC 6.6, hemoglobin 13.0, platelets 239.  Sodium 139, potassium 4.0, bicarbonate 25, serum creatinine 0.81.  EKG showed sinus rhythm without any ST-T wave changes.  VBG showed 7.33/54/<31 HCO3 28 Chest x-ray showed chronic bronchitic changes.  The patient was started on bronchodilators and IV Solu-Medrol   Review of Systems: As mentioned in the history of present illness. All other systems reviewed and are negative. Past Medical History:  Diagnosis Date   Allergy    COPD (chronic obstructive pulmonary disease) (HCC)    Syncope    Tobacco abuse 12/22/2016   Past Surgical History:  Procedure Laterality Date   CHOLECYSTECTOMY     Social History:  reports that she has been smoking cigarettes. She has a 43 pack-year smoking history. She has never used smokeless  tobacco. She reports that she does not drink alcohol  and does not use drugs.  Allergies  Allergen Reactions   Neomycin Rash    Family History  Problem Relation Age of Onset   COPD Mother    Cancer Father    Hypertension Sister    Diabetes Sister    Heart disease Sister    Breast cancer Maternal Aunt    Alcohol  abuse Brother    COPD Brother     Prior to Admission medications   Medication Sig Start Date End Date Taking? Authorizing Provider  albuterol  (VENTOLIN  HFA) 108 (90 Base) MCG/ACT inhaler Inhale 2 puffs into the lungs every 6 (six) hours as needed for wheezing or shortness of breath. 10/19/21   Paseda, Folashade R, FNP  alendronate (FOSAMAX) 70 MG tablet Take 70 mg by mouth once a week. 06/06/23   [provider]  Budeson-Glycopyrrol-Formoterol (BREZTRI  AEROSPHERE) 160-9-4.8 MCG/ACT AERO TAKE 2 PUFFS BY MOUTH TWICE DAILY (IN THE MORNING AND 12 HOURS LATER). 12/25/22   Darlean Ozell NOVAK, MD  ibuprofen (ADVIL) 800 MG tablet Take 800 mg by mouth every 8 (eight) hours as needed. 08/01/21   [provider]  ipratropium-albuterol  (DUONEB) 0.5-2.5 (3) MG/3ML SOLN Take 3 mLs by nebulization every 6 (six) hours as needed (for shortness of breath or wheezing). 10/23/21 11/22/21  Maree, Pratik D, DO  TRELEGY ELLIPTA 100-62.5-25 MCG/ACT AEPB Inhale 1 puff into the lungs daily. 07/12/23   [provider]  Vitamin D , Ergocalciferol , (DRISDOL ) 1.25 MG (50000 UNIT) CAPS capsule Take 1 capsule (50,000 Units total) by  mouth every 7 (seven) days. 03/16/22   Paseda, Folashade R, FNP    Physical Exam: Vitals:   04/05/24 0111 04/05/24 0145 04/05/24 0510 04/05/24 0545  BP:  96/83 123/74   Pulse:  60 64 (!) 55  Resp:  20 (!) 24 (!) 28  Temp:   97.7 F (36.5 C)   TempSrc:      SpO2: 94% 100% 92% 97%  Weight:   54 kg   Height:       GENERAL:  A&O x 3, NAD, well developed, cooperative, follows commands HEENT: [place]/AT, No thrush, No icterus, No oral ulcers Neck:  No neck mass, No  meningismus, soft, supple CV: RRR, no S3, no S4, no rub, no JVD Lungs: Diminished breath sounds.  Bilateral expiratory wheeze.  Bibasilar rales. Abd: soft/NT +BS, nondistended Ext: No edema, no lymphangitis, no cyanosis, no rashes Neuro:  CN II-XII intact, strength 4/5 in RUE, RLE, strength 4/5 LUE, LLE; sensation intact bilateral; no dysmetria; babinski equivocal  Data Reviewed: Data reviewed above in the history  Assessment and Plan: Acute respiratory failure with hypoxia and hypercarbia - Secondary to COPD exacerbation - Currently on 4 L nasal cannula - Wean oxygen as tolerated for saturation greater than 92% -7/12 VBG 7.33/54/<31/28  COPD exacerbation - Start Brovana  - Start Pulmicort  - Start DuoNebs - Start IV Solu-Medrol  - Check viral respiratory panel  Tobacco abuse - Tobacco cessation discussed   Advance Care Planning: FULL  Consults: none  Family Communication: none  Severity of Illness: The appropriate patient status for this patient is OBSERVATION. Observation status is judged to be reasonable and necessary in order to provide the required intensity of service to ensure the patient's safety. The patient's presenting symptoms, physical exam findings, and initial radiographic and laboratory data in the context of their medical condition is felt to place them at decreased risk for further clinical deterioration. Furthermore, it is anticipated that the patient will be medically stable for discharge from the hospital within 2 midnights of admission.   Author: Alm Schneider, MD 04/05/2024 7:52 AM  For on call review [email or web address].

## 2024-04-06 DIAGNOSIS — Z833 Family history of diabetes mellitus: Secondary | ICD-10-CM | POA: Diagnosis not present

## 2024-04-06 DIAGNOSIS — R0602 Shortness of breath: Secondary | ICD-10-CM | POA: Diagnosis present

## 2024-04-06 DIAGNOSIS — J9602 Acute respiratory failure with hypercapnia: Secondary | ICD-10-CM

## 2024-04-06 DIAGNOSIS — Z811 Family history of alcohol abuse and dependence: Secondary | ICD-10-CM | POA: Diagnosis not present

## 2024-04-06 DIAGNOSIS — Z7983 Long term (current) use of bisphosphonates: Secondary | ICD-10-CM | POA: Diagnosis not present

## 2024-04-06 DIAGNOSIS — Z881 Allergy status to other antibiotic agents status: Secondary | ICD-10-CM | POA: Diagnosis not present

## 2024-04-06 DIAGNOSIS — Z79899 Other long term (current) drug therapy: Secondary | ICD-10-CM | POA: Diagnosis not present

## 2024-04-06 DIAGNOSIS — Z8249 Family history of ischemic heart disease and other diseases of the circulatory system: Secondary | ICD-10-CM | POA: Diagnosis not present

## 2024-04-06 DIAGNOSIS — Z7951 Long term (current) use of inhaled steroids: Secondary | ICD-10-CM | POA: Diagnosis not present

## 2024-04-06 DIAGNOSIS — J441 Chronic obstructive pulmonary disease with (acute) exacerbation: Principal | ICD-10-CM | POA: Diagnosis present

## 2024-04-06 DIAGNOSIS — F17210 Nicotine dependence, cigarettes, uncomplicated: Secondary | ICD-10-CM | POA: Diagnosis present

## 2024-04-06 DIAGNOSIS — J9601 Acute respiratory failure with hypoxia: Secondary | ICD-10-CM | POA: Diagnosis present

## 2024-04-06 DIAGNOSIS — Z803 Family history of malignant neoplasm of breast: Secondary | ICD-10-CM | POA: Diagnosis not present

## 2024-04-06 DIAGNOSIS — Z859 Personal history of malignant neoplasm, unspecified: Secondary | ICD-10-CM | POA: Diagnosis not present

## 2024-04-06 DIAGNOSIS — Z825 Family history of asthma and other chronic lower respiratory diseases: Secondary | ICD-10-CM | POA: Diagnosis not present

## 2024-04-06 DIAGNOSIS — Z9049 Acquired absence of other specified parts of digestive tract: Secondary | ICD-10-CM | POA: Diagnosis not present

## 2024-04-06 LAB — RESPIRATORY PANEL BY PCR

## 2024-04-06 LAB — CBC
HCT: 37.4 % (ref 36.0–46.0)
Hemoglobin: 12.2 g/dL (ref 12.0–15.0)
MCH: 23.8 pg — ABNORMAL LOW (ref 26.0–34.0)
MCHC: 32.6 g/dL (ref 30.0–36.0)
MCV: 73 fL — ABNORMAL LOW (ref 80.0–100.0)
Platelets: 224 K/uL (ref 150–400)
RBC: 5.12 MIL/uL — ABNORMAL HIGH (ref 3.87–5.11)
RDW: 15.6 % — ABNORMAL HIGH (ref 11.5–15.5)
WBC: 9.7 K/uL (ref 4.0–10.5)
nRBC: 0 % (ref 0.0–0.2)

## 2024-04-06 LAB — BASIC METABOLIC PANEL WITH GFR
Anion gap: 7 (ref 5–15)
BUN: 12 mg/dL (ref 8–23)
CO2: 24 mmol/L (ref 22–32)
Calcium: 9.1 mg/dL (ref 8.9–10.3)
Chloride: 104 mmol/L (ref 98–111)
Creatinine, Ser: 0.82 mg/dL (ref 0.44–1.00)
GFR, Estimated: >60 mL/min (ref >60)
Glucose, Bld: 103 mg/dL — ABNORMAL HIGH (ref 70–99)
Potassium: 3.8 mmol/L (ref 3.5–5.1)
Sodium: 135 mmol/L (ref 135–145)

## 2024-04-06 LAB — HIV ANTIBODY (ROUTINE TESTING W REFLEX): HIV Screen 4th Generation wRfx: NONREACTIVE

## 2024-04-06 MED ORDER — ALBUTEROL SULFATE (2.5 MG/3ML) 0.083% IN NEBU
2.5000 mg | INHALATION_SOLUTION | Freq: Four times a day (QID) | RESPIRATORY_TRACT | Status: DC
  Administered 2024-04-07: 2.5 mg via RESPIRATORY_TRACT
  Filled 2024-04-06: qty 3

## 2024-04-06 MED ORDER — REVEFENACIN 175 MCG/3ML IN SOLN
175.0000 ug | Freq: Every day | RESPIRATORY_TRACT | Status: DC
  Administered 2024-04-07: 175 ug via RESPIRATORY_TRACT
  Filled 2024-04-06: qty 3

## 2024-04-06 NOTE — Progress Notes (Signed)
 PROGRESS NOTE  Stephanie Hendrix FMW:[id number] DOB: October 02, 1955 DOA: 04/05/2024 PCP: Carlette Benita Area, MD  Brief History:  68 year old female with a history of tobacco abuse and COPD presenting with 2-week history of shortness of breath that significantly worsened in the past 24 hours prior to admission.  She complains of a largely nonproductive cough.  She denies any fevers, chills, hemoptysis, nausea, vomiting, diarrhea, domino pain.  She does have some right-sided chest pain worsening with coughing.  She denies any diarrhea, hematochezia, melena, dysuria.  She continues to smoke up to 2 packs/day.  She has over 100-pack-year history.  She denies any illicit drugs.  She was using her nebulizer machine at home without much improvement.  As result she presented for further evaluation and treatment.  In the ED, the patient was afebrile and hemodynamically stable with oxygen saturation 88% room air.  She is now placed on 4 L with saturation 95-97%.  WBC 6.6, hemoglobin 13.0, platelets 239.  Sodium 139, potassium 4.0, bicarbonate 25, serum creatinine 0.81.  EKG showed sinus rhythm without any ST-T wave changes.  VBG showed 7.33/54/<31 HCO3 28 Chest x-ray showed chronic bronchitic changes.  The patient was started on bronchodilators and IV Solu-Medrol    Assessment/Plan: Acute respiratory failure with hypoxia and hypercarbia - Secondary to COPD exacerbation - Currently on 4 L nasal cannula>>3L - Wean oxygen as tolerated for saturation greater than 92% -7/12 VBG 7.33/54/<31/28   COPD exacerbation - Continue Brovana  - Continue Pulmicort  - Continue DuoNebs - Continue IV Solu-Medrol  - Check viral respiratory panel--neg - add Yulpelri   Tobacco abuse - Tobacco cessation discussed        Family Communication:   niece at bedside 7/13  Consultants:  none  Code Status:  FULL   DVT Prophylaxis:   [place] Lovenox    Procedures: As Listed in Progress Note  Above  Antibiotics: None     Subjective: Pt states she is breathing better.  Has dry cough.  Denies f/c, cp, n/v/d, abd pain  Objective: Vitals:   04/06/24 0619 04/06/24 0940 04/06/24 1224 04/06/24 1328  BP: 97/73  (!) 97/54   Pulse: 66  60   Resp: 20     Temp: 98.5 F (36.9 C)  98.1 F (36.7 C)   TempSrc: Oral  Oral   SpO2: 92% 95% 98% 98%  Weight:      Height:        Intake/Output Summary (Last 24 hours) at 04/06/2024 1650 Last data filed at 04/06/2024 0900 Gross per 24 hour  Intake 600 ml  Output --  Net 600 ml   Weight change:  Exam:  General:  Pt is alert, follows commands appropriately, not in acute distress HEENT: No icterus, No thrush, No neck mass, [place]/AT Cardiovascular: RRR, S1/S2, no rubs, no gallops Respiratory: bibasilar rales.  Diminished BS.  No wheeze Abdomen: Soft/+BS, non tender, non distended, no guarding Extremities: No edema.  No lymphangitis, No petechiae, No rashes, no synovitis   Data Reviewed: I have personally reviewed following labs and imaging studies Basic Metabolic Panel: Recent Labs  Lab 04/05/24 0114 04/06/24 0314  NA 139 135  K 4.0 3.8  CL 102 104  CO2 25 24  GLUCOSE 124* 103*  BUN 8 12  CREATININE 0.81 0.82  CALCIUM 8.9 9.1   Liver Function Tests: No results for input(s): AST, ALT, ALKPHOS, BILITOT, PROT, ALBUMIN in the last 168 hours. No results for input(s): LIPASE, AMYLASE in the  last 168 hours. No results for input(s): AMMONIA in the last 168 hours. Coagulation Profile: No results for input(s): INR, PROTIME in the last 168 hours. CBC: Recent Labs  Lab 04/05/24 0114 04/06/24 0314  WBC 6.6 9.7  NEUTROABS 3.0  --   HGB 13.0 12.2  HCT 40.3 37.4  MCV 74.1* 73.0*  PLT 239 224   Cardiac Enzymes: No results for input(s): CKTOTAL, CKMB, CKMBINDEX, TROPONINI in the last 168 hours. BNP: Invalid input(s): POCBNP CBG: No results for input(s): GLUCAP in the last 168  hours. HbA1C: No results for input(s): HGBA1C in the last 72 hours. Urine analysis:    Component Value Date/Time   COLORURINE STRAW (A) 08/24/2018 1840   APPEARANCEUR CLEAR 08/24/2018 1840   LABSPEC 1.036 (H) 08/24/2018 1840   PHURINE 5.0 08/24/2018 1840   GLUCOSEU NEGATIVE 08/24/2018 1840   HGBUR SMALL (A) 08/24/2018 1840   BILIRUBINUR NEGATIVE 08/24/2018 1840   KETONESUR NEGATIVE 08/24/2018 1840   PROTEINUR NEGATIVE 08/24/2018 1840   UROBILINOGEN 2.0 (H) 09/19/2012 2319   NITRITE NEGATIVE 08/24/2018 1840   LEUKOCYTESUR NEGATIVE 08/24/2018 1840   Sepsis Labs: @LABRCNTIP (procalcitonin:4,lacticidven:4) ) Recent Results (from the past 240 hours)  Resp panel by RT-PCR (RSV, Flu A&B, Covid) Anterior Nasal Swab     Status: None   Collection Time: 04/05/24  7:35 AM   Specimen: Anterior Nasal Swab  Result Value Ref Range Status   SARS Coronavirus 2 by RT PCR NEGATIVE NEGATIVE Final    Comment: (NOTE) SARS-CoV-2 target nucleic acids are NOT DETECTED.  The SARS-CoV-2 RNA is generally detectable in upper respiratory specimens during the acute phase of infection. The lowest concentration of SARS-CoV-2 viral copies this assay can detect is 138 copies/mL. A negative result does not preclude SARS-Cov-2 infection and should not be used as the sole basis for treatment or other patient management decisions. A negative result may occur with  improper specimen collection/handling, submission of specimen other than nasopharyngeal swab, presence of viral mutation(s) within the areas targeted by this assay, and inadequate number of viral copies(<138 copies/mL). A negative result must be combined with clinical observations, patient history, and epidemiological information. The expected result is Negative.  Fact Sheet for Patients:  BloggerCourse.com  Fact Sheet for Healthcare Providers:  SeriousBroker.it  This test is no t yet approved or  cleared by the [place]  FDA and  has been authorized for detection and/or diagnosis of SARS-CoV-2 by FDA under an Emergency Use Authorization (EUA). This EUA will remain  in effect (meaning this test can be used) for the duration of the COVID-19 declaration under Section 564(b)(1) of the Act, 21 U.S.C.section 360bbb-3(b)(1), unless the authorization is terminated  or revoked sooner.       Influenza A by PCR NEGATIVE NEGATIVE Final   Influenza B by PCR NEGATIVE NEGATIVE Final    Comment: (NOTE) The Xpert Xpress SARS-CoV-2/FLU/RSV plus assay is intended as an aid in the diagnosis of influenza from Nasopharyngeal swab specimens and should not be used as a sole basis for treatment. Nasal washings and aspirates are unacceptable for Xpert Xpress SARS-CoV-2/FLU/RSV testing.  Fact Sheet for Patients: BloggerCourse.com  Fact Sheet for Healthcare Providers: SeriousBroker.it  This test is not yet approved or cleared by the [place]  FDA and has been authorized for detection and/or diagnosis of SARS-CoV-2 by FDA under an Emergency Use Authorization (EUA). This EUA will remain in effect (meaning this test can be used) for the duration of the COVID-19 declaration under Section 564(b)(1) of the Act, 21  U.S.C. section 360bbb-3(b)(1), unless the authorization is terminated or revoked.     Resp Syncytial Virus by PCR NEGATIVE NEGATIVE Final    Comment: (NOTE) Fact Sheet for Patients: BloggerCourse.com  Fact Sheet for Healthcare Providers: SeriousBroker.it  This test is not yet approved or cleared by the [place]  FDA and has been authorized for detection and/or diagnosis of SARS-CoV-2 by FDA under an Emergency Use Authorization (EUA). This EUA will remain in effect (meaning this test can be used) for the duration of the COVID-19 declaration under Section 564(b)(1) of the Act, 21  U.S.C. section 360bbb-3(b)(1), unless the authorization is terminated or revoked.  Performed at [facility], [address] [place], [place] [address]   Respiratory (~20 pathogens) panel by PCR     Status: None   Collection Time: 04/05/24  5:20 PM   Specimen: Nasopharyngeal Swab; Respiratory  Result Value Ref Range Status   Adenovirus NOT DETECTED NOT DETECTED Final   Coronavirus 229E NOT DETECTED NOT DETECTED Final    Comment: (NOTE) The Coronavirus on the Respiratory Panel, DOES NOT test for the novel  Coronavirus (2019 nCoV)    Coronavirus HKU1 NOT DETECTED NOT DETECTED Final   Coronavirus NL63 NOT DETECTED NOT DETECTED Final   Coronavirus OC43 NOT DETECTED NOT DETECTED Final   Metapneumovirus NOT DETECTED NOT DETECTED Final   Rhinovirus / Enterovirus NOT DETECTED NOT DETECTED Final   Influenza A NOT DETECTED NOT DETECTED Final   Influenza B NOT DETECTED NOT DETECTED Final   Parainfluenza Virus 1 NOT DETECTED NOT DETECTED Final   Parainfluenza Virus 2 NOT DETECTED NOT DETECTED Final   Parainfluenza Virus 3 NOT DETECTED NOT DETECTED Final   Parainfluenza Virus 4 NOT DETECTED NOT DETECTED Final   Respiratory Syncytial Virus NOT DETECTED NOT DETECTED Final   Bordetella pertussis NOT DETECTED NOT DETECTED Final   Bordetella Parapertussis NOT DETECTED NOT DETECTED Final   Chlamydophila pneumoniae NOT DETECTED NOT DETECTED Final   Mycoplasma pneumoniae NOT DETECTED NOT DETECTED Final    Comment: Performed at [facility], [address] [place], [place] [address]     Scheduled Meds:  acetaminophen   1,000 mg Oral Q8H   [START ON 04/07/2024] albuterol   2.5 mg Nebulization Q6H   arformoterol   15 mcg Nebulization BID   budesonide  (PULMICORT ) nebulizer solution  0.5 mg Nebulization BID   enoxaparin  (LOVENOX ) injection  40 mg Subcutaneous Q24H   ipratropium-albuterol   3 mL Nebulization Q6H   methylPREDNISolone  (SOLU-MEDROL ) injection  40 mg Intravenous Q12H   [START ON  04/07/2024] revefenacin   175 mcg Nebulization Daily   Continuous Infusions:  Procedures/Studies: DG Chest Port 1 View Result Date: 04/05/2024 CLINICAL DATA:  Shortness of breath for couple weeks. EXAM: PORTABLE CHEST 1 VIEW COMPARISON:  02/22/2023 FINDINGS: Stable cardiomediastinal silhouette. Aortic atherosclerotic calcification. Hyperinflation and chronic bronchitic change. No focal consolidation, pleural effusion, or pneumothorax. No displaced rib fractures. IMPRESSION: No active disease. COPD. Electronically Signed   By: Norman Gatlin M.D.   On: 04/05/2024 01:30    Alm Schneider, DO  [facility]  If 7PM-7AM, please contact night-coverage [email or web address] Password [email or web address] 04/06/2024, 4:50 PM   LOS: 0 days

## 2024-04-06 NOTE — Plan of Care (Signed)

## 2024-04-06 NOTE — Progress Notes (Signed)
 SATURATION QUALIFICATIONS: (This note is used to comply with regulatory documentation for home oxygen)  Patient Saturations on Room Air at Rest = 96%  Patient Saturations on Room Air while Ambulating = 91%  Patient Saturations on 0 Liters of oxygen while Ambulating = 91%  Please briefly explain why patient needs home oxygen:  Pt. Oxygen saturation did well with no oxygen requirements.

## 2024-04-06 NOTE — Progress Notes (Signed)
 Pt. Performed oxygen saturation qualification test. She did not require oxygen while ambulating (O2 sat stayed above 91%) but pt. Had a terrible coughing spell and she felt like she could not catch her breath. Pt. Applied [place] back on for comfort at 2L/min.

## 2024-04-07 DIAGNOSIS — J441 Chronic obstructive pulmonary disease with (acute) exacerbation: Secondary | ICD-10-CM | POA: Diagnosis not present

## 2024-04-07 MED ORDER — PREDNISONE 10 MG PO TABS: 60.0000 mg | ORAL_TABLET | Freq: Every day | ORAL | 0 refills | Status: AC

## 2024-04-07 MED ORDER — PREDNISONE 20 MG PO TABS
60.0000 mg | ORAL_TABLET | Freq: Every day | ORAL | Status: DC
  Administered 2024-04-07: 60 mg via ORAL
  Filled 2024-04-07: qty 3

## 2024-04-07 MED ORDER — ALBUTEROL SULFATE (2.5 MG/3ML) 0.083% IN NEBU: 2.5000 mg | INHALATION_SOLUTION | Freq: Two times a day (BID) | RESPIRATORY_TRACT | Status: DC

## 2024-04-07 NOTE — Progress Notes (Signed)
Patient discharged home with instructions given on medications and follow up visits,verbalized understanding .Prescriptions sent to Pharmacy of choice documented on AVS.IV discontinued,catheter intact. Accompanied by staff to an awaiting vehicle.

## 2024-04-07 NOTE — Discharge Summary (Signed)
 Physician Discharge Summary   Patient: Stephanie Hendrix MRN: [id number] DOB: 08-30-1956  Admit date:     04/05/2024  Discharge date: 04/07/24  Discharge Physician: Alm Shanasia Ibrahim   PCP: Carlette Benita Area, MD   Recommendations at discharge:   Please follow up with primary care provider within 1-2 weeks  Please repeat BMP and CBC in one week     Hospital Course: 68 year old female with a history of tobacco abuse and COPD presenting with 2-week history of shortness of breath that significantly worsened in the past 24 hours prior to admission.  She complains of a largely nonproductive cough.  She denies any fevers, chills, hemoptysis, nausea, vomiting, diarrhea, domino pain.  She does have some right-sided chest pain worsening with coughing.  She denies any diarrhea, hematochezia, melena, dysuria.  She continues to smoke up to 2 packs/day.  She has over 100-pack-year history.  She denies any illicit drugs.  She was using her nebulizer machine at home without much improvement.  As result she presented for further evaluation and treatment.  In the ED, the patient was afebrile and hemodynamically stable with oxygen saturation 88% room air.  She is now placed on 4 L with saturation 95-97%.  WBC 6.6, hemoglobin 13.0, platelets 239.  Sodium 139, potassium 4.0, bicarbonate 25, serum creatinine 0.81.  EKG showed sinus rhythm without any ST-T wave changes.  VBG showed 7.33/54/<31 HCO3 28 Chest x-ray showed chronic bronchitic changes.  The patient was started on bronchodilators and IV Solu-Medrol   Assessment and Plan: Acute respiratory failure with hypoxia and hypercarbia - Secondary to COPD exacerbation - Currently on 4 L nasal cannula>>3L - Wean oxygen as tolerated for saturation greater than 92% -7/12 VBG 7.33/54/<31/28 -weaned to RA -ambulated on RA without desaturation <94% on 7/14   COPD exacerbation - Continue Brovana  - Continue Pulmicort  - Continue DuoNebs - Continue IV Solu-Medrol  - Check  viral respiratory panel--neg - added Yulpelri   Tobacco abuse - Tobacco cessation discussed         Consultants: none Procedures performed: none  Disposition: Home Diet recommendation:  Regular diet DISCHARGE MEDICATION: Allergies as of 04/07/2024       Reactions   Fish Allergy Hives, Itching, Swelling, Rash   Neomycin Rash        Medication List     TAKE these medications    acetaminophen  500 MG tablet Commonly known as: TYLENOL  Take 1,000 mg by mouth every 6 (six) hours as needed for mild pain (pain score 1-3).   albuterol  108 (90 Base) MCG/ACT inhaler Commonly known as: VENTOLIN  HFA Inhale 2 puffs into the lungs every 6 (six) hours as needed for wheezing or shortness of breath.   alendronate 70 MG tablet Commonly known as: FOSAMAX Take 70 mg by mouth once a week.   ipratropium-albuterol  0.5-2.5 (3) MG/3ML Soln Commonly known as: DUONEB Take 3 mLs by nebulization every 6 (six) hours as needed (for shortness of breath or wheezing).   predniSONE  10 MG tablet Commonly known as: DELTASONE  Take 6 tablets (60 mg total) by mouth daily with breakfast. And decrease by one tablet daily Start taking on: April 08, 2024   Trelegy Ellipta 100-62.5-25 MCG/ACT Aepb Generic drug: Fluticasone-Umeclidin-Vilant Inhale 1 puff into the lungs daily.        Discharge Exam: Filed Weights   04/05/24 0054 04/05/24 0510  Weight: 54 kg 54 kg   HEENT:  [place]/AT, No thrush, no icterus CV:  RRR, no rub, no S3, no S4 Lung:  diminished BS.  No wheeze.  Bibasilar rales Abd:  soft/+BS, NT Ext:  No edema, no lymphangitis, no synovitis, no rash   Condition at discharge: stable  The results of significant diagnostics from this hospitalization (including imaging, microbiology, ancillary and laboratory) are listed below for reference.   Imaging Studies: DG Chest Port 1 View Result Date: 04/05/2024 CLINICAL DATA:  Shortness of breath for couple weeks. EXAM: PORTABLE CHEST 1 VIEW  COMPARISON:  02/22/2023 FINDINGS: Stable cardiomediastinal silhouette. Aortic atherosclerotic calcification. Hyperinflation and chronic bronchitic change. No focal consolidation, pleural effusion, or pneumothorax. No displaced rib fractures. IMPRESSION: No active disease. COPD. Electronically Signed   By: Norman Gatlin M.D.   On: 04/05/2024 01:30    Microbiology: Results for orders placed or performed during the hospital encounter of 04/05/24  Resp panel by RT-PCR (RSV, Flu A&B, Covid) Anterior Nasal Swab     Status: None   Collection Time: 04/05/24  7:35 AM   Specimen: Anterior Nasal Swab  Result Value Ref Range Status   SARS Coronavirus 2 by RT PCR NEGATIVE NEGATIVE Final    Comment: (NOTE) SARS-CoV-2 target nucleic acids are NOT DETECTED.  The SARS-CoV-2 RNA is generally detectable in upper respiratory specimens during the acute phase of infection. The lowest concentration of SARS-CoV-2 viral copies this assay can detect is 138 copies/mL. A negative result does not preclude SARS-Cov-2 infection and should not be used as the sole basis for treatment or other patient management decisions. A negative result may occur with  improper specimen collection/handling, submission of specimen other than nasopharyngeal swab, presence of viral mutation(s) within the areas targeted by this assay, and inadequate number of viral copies(<138 copies/mL). A negative result must be combined with clinical observations, patient history, and epidemiological information. The expected result is Negative.  Fact Sheet for Patients:  BloggerCourse.com  Fact Sheet for Healthcare Providers:  SeriousBroker.it  This test is no t yet approved or cleared by the [place]  FDA and  has been authorized for detection and/or diagnosis of SARS-CoV-2 by FDA under an Emergency Use Authorization (EUA). This EUA will remain  in effect (meaning this test can be used)  for the duration of the COVID-19 declaration under Section 564(b)(1) of the Act, 21 U.S.C.section 360bbb-3(b)(1), unless the authorization is terminated  or revoked sooner.       Influenza A by PCR NEGATIVE NEGATIVE Final   Influenza B by PCR NEGATIVE NEGATIVE Final    Comment: (NOTE) The Xpert Xpress SARS-CoV-2/FLU/RSV plus assay is intended as an aid in the diagnosis of influenza from Nasopharyngeal swab specimens and should not be used as a sole basis for treatment. Nasal washings and aspirates are unacceptable for Xpert Xpress SARS-CoV-2/FLU/RSV testing.  Fact Sheet for Patients: BloggerCourse.com  Fact Sheet for Healthcare Providers: SeriousBroker.it  This test is not yet approved or cleared by the [place]  FDA and has been authorized for detection and/or diagnosis of SARS-CoV-2 by FDA under an Emergency Use Authorization (EUA). This EUA will remain in effect (meaning this test can be used) for the duration of the COVID-19 declaration under Section 564(b)(1) of the Act, 21 U.S.C. section 360bbb-3(b)(1), unless the authorization is terminated or revoked.     Resp Syncytial Virus by PCR NEGATIVE NEGATIVE Final    Comment: (NOTE) Fact Sheet for Patients: BloggerCourse.com  Fact Sheet for Healthcare Providers: SeriousBroker.it  This test is not yet approved or cleared by the [place]  FDA and has been authorized for detection and/or diagnosis of SARS-CoV-2 by FDA under an Emergency Use  Authorization (EUA). This EUA will remain in effect (meaning this test can be used) for the duration of the COVID-19 declaration under Section 564(b)(1) of the Act, 21 U.S.C. section 360bbb-3(b)(1), unless the authorization is terminated or revoked.  Performed at [facility], [address], [place], [place] [address]   Respiratory (~20 pathogens) panel by PCR     Status: None    Collection Time: 04/05/24  5:20 PM   Specimen: Nasopharyngeal Swab; Respiratory  Result Value Ref Range Status   Adenovirus NOT DETECTED NOT DETECTED Final   Coronavirus 229E NOT DETECTED NOT DETECTED Final    Comment: (NOTE) The Coronavirus on the Respiratory Panel, DOES NOT test for the novel  Coronavirus (2019 nCoV)    Coronavirus HKU1 NOT DETECTED NOT DETECTED Final   Coronavirus NL63 NOT DETECTED NOT DETECTED Final   Coronavirus OC43 NOT DETECTED NOT DETECTED Final   Metapneumovirus NOT DETECTED NOT DETECTED Final   Rhinovirus / Enterovirus NOT DETECTED NOT DETECTED Final   Influenza A NOT DETECTED NOT DETECTED Final   Influenza B NOT DETECTED NOT DETECTED Final   Parainfluenza Virus 1 NOT DETECTED NOT DETECTED Final   Parainfluenza Virus 2 NOT DETECTED NOT DETECTED Final   Parainfluenza Virus 3 NOT DETECTED NOT DETECTED Final   Parainfluenza Virus 4 NOT DETECTED NOT DETECTED Final   Respiratory Syncytial Virus NOT DETECTED NOT DETECTED Final   Bordetella pertussis NOT DETECTED NOT DETECTED Final   Bordetella Parapertussis NOT DETECTED NOT DETECTED Final   Chlamydophila pneumoniae NOT DETECTED NOT DETECTED Final   Mycoplasma pneumoniae NOT DETECTED NOT DETECTED Final    Comment: Performed at [facility], [address] [place], [place] [address]    Labs: CBC: Recent Labs  Lab 04/05/24 0114 04/06/24 0314  WBC 6.6 9.7  NEUTROABS 3.0  --   HGB 13.0 12.2  HCT 40.3 37.4  MCV 74.1* 73.0*  PLT 239 224   Basic Metabolic Panel: Recent Labs  Lab 04/05/24 0114 04/06/24 0314  NA 139 135  K 4.0 3.8  CL 102 104  CO2 25 24  GLUCOSE 124* 103*  BUN 8 12  CREATININE 0.81 0.82  CALCIUM 8.9 9.1   Liver Function Tests: No results for input(s): AST, ALT, ALKPHOS, BILITOT, PROT, ALBUMIN in the last 168 hours. CBG: No results for input(s): GLUCAP in the last 168 hours.  Discharge time spent: greater than 30 minutes.  Signed: Alm Schneider, MD [facility] 04/07/2024

## 2024-04-08 ENCOUNTER — Telehealth: Payer: Self-pay | Admitting: *Deleted

## 2024-04-08 DIAGNOSIS — J441 Chronic obstructive pulmonary disease with (acute) exacerbation: Secondary | ICD-10-CM

## 2024-04-08 NOTE — Transitions of Care (Post Inpatient/ED Visit) (Signed)
 04/08/2024  Name: Stephanie Hendrix MRN: [id number] DOB: 1955/10/12  Today's TOC FU Call Status: Today's TOC FU Call Status:: Successful TOC FU Call Completed TOC FU Call Complete Date: 04/08/24 Patient's Name and Date of Birth confirmed.  Transition Care Management Follow-up Telephone Call Date of Discharge: 04/07/24 [facility] Type of Discharge: Inpatient Admission Primary Inpatient Discharge Diagnosis:: Acute exacerbation of chronic obstructive pulmonary disease  START taking: How have you been since you were released from the hospital?: Better (just a little cough/ no shortness of breath) Any questions or concerns?: Yes Patient Questions/Concerns:: I was wondering how I can get my daughter as a paid  care giver since she takes care of my mom and me. Patient Questions/Concerns Addressed: Other: (RN sent a referral to Child psychotherapist)  Items Reviewed: Did you receive and understand the discharge instructions provided?: Yes Medications obtained,verified, and reconciled?: Yes (Medications Reviewed) Any new allergies since your discharge?: No Dietary orders reviewed?: No Do you have support at home?: Yes People in Home [RPT]: child(ren), adult, parent(s), grandchild(ren) Name of Support/Comfort Primary Source: Stephanie Hendrix  Medications Reviewed Today: Medications Reviewed Today     Reviewed by Stephanie Hendrix DEL, RN (Case Manager) on 04/08/24 at 1135  Med List Status: <None>   Medication Order Taking? Sig Documenting Provider Last Dose Status Informant  acetaminophen  (TYLENOL ) 500 MG tablet [id number] Yes Take 1,000 mg by mouth every 6 (six) hours as needed for mild pain (pain score 1-3). [provider]  Active Self, Pharmacy Records  albuterol  (VENTOLIN  HFA) 108 [id number] Base) MCG/ACT inhaler [id number] Yes Inhale 2 puffs into the lungs every 6 (six) hours as needed for wheezing or shortness of breath. Paseda, Folashade R, FNP  Active Self, Pharmacy Records   alendronate (FOSAMAX) 70 MG tablet [id number] Yes Take 70 mg by mouth once a week. [provider]  Active Self, Pharmacy Records  ipratropium-albuterol  (DUONEB) 0.5-2.5 (3) MG/3ML SOLN [id number] Yes Take 3 mLs by nebulization every 6 (six) hours as needed (for shortness of breath or wheezing). Maree Bracken D, DO  Active Self, Pharmacy Records  predniSONE  (DELTASONE ) 10 MG tablet [id number] Yes Take 6 tablets (60 mg total) by mouth daily with breakfast. And decrease by one tablet daily Tat, Alm, MD  Active   TRELEGY ELLIPTA 100-62.5-25 MCG/ACT AEPB [id number] Yes Inhale 1 puff into the lungs daily. [provider]  Active Self, Pharmacy Records            Home Care and Equipment/Supplies: Were Home Health Services Ordered?: NA Any new equipment or medical supplies ordered?: NA  Functional Questionnaire: Do you need assistance with bathing/showering or dressing?: No Do you need assistance with meal preparation?: No Do you need assistance with eating?: No Do you have difficulty maintaining continence: No Do you need assistance with getting out of bed/getting out of a chair/moving?: No Do you have difficulty managing or taking your medications?: No  Follow up appointments reviewed: PCP Follow-up appointment confirmed?: No MD Provider Line [phone] Given: Yes (Per patient she will call for a follow up appointment) Specialist Hospital Follow-up appointment confirmed?: NA Do you need transportation to your follow-up appointment?: No Do you understand care options if your condition(s) worsen?: Yes-patient verbalized understanding  SDOH Interventions Today    Flowsheet Row Most Recent Value  SDOH Interventions   Food Insecurity Interventions Intervention Not Indicated  Housing Interventions Intervention Not Indicated  Transportation Interventions Intervention Not Indicated, Patient Resources (Friends/Family)  Utilities Interventions Intervention Not  Indicated  Goals Addressed             This Visit's Progress    VBCI Transitions of Care (TOC) Care Plan       Problems:  Recent Hospitalization for treatment of COPD Knowledge Deficit Related to COPD  and No Hospital Follow Up Provider appointment Patient stated she will call to make an earlier appointment  Goal:  Over the next 30 days, the patient will not experience hospital readmission  Interventions:   COPD Interventions: Advised patient to track and manage COPD triggers Assessed social determinant of health barriers Provided education about and advised patient to utilize infection prevention strategies to reduce risk of respiratory infection  Patient Self Care Activities:  Attend all scheduled provider appointments Call pharmacy for medication refills 3-7 days in advance of running out of medications Call provider office for new concerns or questions  Notify RN Care Manager of TOC call rescheduling needs Participate in Transition of Care Program/Attend TOC scheduled calls Perform all self care activities independently  Take medications as prescribed   listen for public air quality announcements every day eliminate symptom triggers at home  Plan:  An initial telephone outreach has been scheduled for: 04/15/2024 Next PCP appointment scheduled for: Per patient she will call and make appointment Telephone follow up appointment with care management team member scheduled for: Stephanie Hendrix 04/15/2024 1:00      The patient has been provided with contact information for the care management team and has been advised to call with any health-related questions or concerns. The patient verbalized understanding with current POC. The patient is directed to their insurance card regarding availability of benefits coverage   Per patient she will call and make PCP f/u appointment  Referral to Social worker  Stephanie Hendrix BSN RN [facility] Health Care Management  Coordinator [email or web address] Direct Dial: [phone]  Fax: [phone] Website: [email or web address]

## 2024-04-09 ENCOUNTER — Telehealth: Payer: Self-pay

## 2024-04-09 NOTE — Progress Notes (Signed)
 Complex Care Management Note Care Guide Note  04/09/2024 Name: Stephanie Hendrix MRN: [id number] DOB: 1956/02/28   Complex Care Management Outreach Attempts: An unsuccessful telephone outreach was attempted today to offer the patient information about available complex care management services.  Follow Up Plan:  Additional outreach attempts will be made to offer the patient complex care management information and services.   Encounter Outcome:  No Answer  Leotis Rase [facility], [place] Guide  Direct Dial: [phone]  Fax [phone]

## 2024-04-11 NOTE — Progress Notes (Signed)
 Complex Care Management Note Care Guide Note  04/11/2024 Name: Stephanie Hendrix MRN: [id number] DOB: Mar 05, 1956   Complex Care Management Outreach Attempts: A second unsuccessful outreach was attempted today to offer the patient with information about available complex care management services.  Follow Up Plan:  Additional outreach attempts will be made to offer the patient complex care management information and services.   Encounter Outcome:  No Answer  Leotis Rase [facility], [facility] Guide  Direct Dial: [phone]  Fax [phone]

## 2024-04-14 DIAGNOSIS — J9621 Acute and chronic respiratory failure with hypoxia: Secondary | ICD-10-CM | POA: Diagnosis not present

## 2024-04-14 DIAGNOSIS — F17200 Nicotine dependence, unspecified, uncomplicated: Secondary | ICD-10-CM | POA: Diagnosis not present

## 2024-04-14 DIAGNOSIS — J441 Chronic obstructive pulmonary disease with (acute) exacerbation: Secondary | ICD-10-CM | POA: Diagnosis not present

## 2024-04-14 DIAGNOSIS — F17210 Nicotine dependence, cigarettes, uncomplicated: Secondary | ICD-10-CM | POA: Diagnosis not present

## 2024-04-14 NOTE — Progress Notes (Signed)
 Complex Care Management Note Care Guide Note  04/14/2024 Name: Stephanie Hendrix MRN: [id number] DOB: 05-21-1956   Complex Care Management Outreach Attempts: A third unsuccessful outreach was attempted today to offer the patient with information about available complex care management services.  Follow Up Plan:  No further outreach attempts will be made at this time. We have been unable to contact the patient to offer or enroll patient in complex care management services.  Encounter Outcome:  No Answer  Leotis Rase [facility], [facility] Guide  Direct Dial: [phone]  Fax [phone]

## 2024-04-15 ENCOUNTER — Other Ambulatory Visit: Payer: Self-pay

## 2024-04-15 NOTE — Patient Instructions (Signed)
 Visit Information  Thank you for taking time to visit with me today. Please don't hesitate to contact me if I can be of assistance to you before our next scheduled telephone appointment.  Our next appointment is by telephone on 04/24/24 at 100 pm  Following is a copy of your care plan:   Goals Addressed             This Visit's Progress    VBCI Transitions of Care (TOC) Care Plan       Problems:  Recent Hospitalization for treatment of COPD Knowledge Deficit Related to COPD   Goal:  Over the next 30 days, the patient will not experience hospital readmission   Interventions:   COPD Interventions: Advised patient to track and manage COPD triggers Provided education about and advised patient to utilize infection prevention strategies to reduce risk of respiratory infection Provided instruction about proper use of medications used for management of COPD including inhalers Discussed benefits of not  smoking such as: lungs will begin to clear and repair themselves, reduce risk of heart attack and stroke, less coughing, colds, and flu.   Patient Self Care Activities:  Attend all scheduled provider appointments Call pharmacy for medication refills 3-7 days in advance of running out of medications Call provider office for new concerns or questions  Notify RN Care Manager of [facility] call rescheduling needs Participate in Transition of Care Program/Attend [facility] scheduled calls Perform all self care activities independently  Take medications as prescribed   listen for public air quality announcements every day eliminate symptom triggers at home Patient saw PCP 04/14/24  Plan:  Telephone follow up appointment with care management team member scheduled for: Ashla Murph 04/24/2024 1:00        Patient verbalizes understanding of instructions and care plan provided today and agrees to view in MyChart. Active MyChart status and patient understanding of how to access instructions and care  plan via MyChart confirmed with patient.     The patient has been provided with contact information for the care management team and has been advised to call with any health related questions or concerns.   Please call the care guide team at [phone] if you need to cancel or reschedule your appointment.   Please call the Suicide and Crisis Lifeline: 988 if you are experiencing a Mental Health or Behavioral Health Crisis or need someone to talk to.  Tacarra Justo J. Kerrington Sova RN, MSN [facility], [facility] RN Care Manager Direct Dial: [phone]  Fax: [phone] Website: [email or web address]

## 2024-04-15 NOTE — Transitions of Care (Post Inpatient/ED Visit) (Signed)
 Transition of Care week 2  Visit Note  04/15/2024  Name: Stephanie Hendrix MRN: [id number]          DOB: [birth year]  Situation: Patient enrolled in [facility] 30-day program. Visit completed with patient by telephone.   Background:   Initial Transition Care Management Follow-up Telephone Call    Past Medical History:  Diagnosis Date   Allergy    COPD (chronic obstructive pulmonary disease) (HCC)    Syncope    Tobacco abuse 12/22/2016    Assessment: Patient Reported Symptoms: Cognitive Cognitive Status: Alert and oriented to person, place, and time, Normal speech and language skills      Neurological Neurological Review of Symptoms: No symptoms reported    HEENT HEENT Symptoms Reported: No symptoms reported      Cardiovascular Cardiovascular Symptoms Reported: No symptoms reported    Respiratory Respiratory Symptoms Reported: Dry cough Additional Respiratory Details: Patient now using nicotine patches and not smoking.  Encouraged patient not to smoke.  Discussed affects of smoking on the lungs.  She verbalized understanding. Respiratory Management Strategies: Medication therapy Respiratory Self-Management Outcome: 4 (good)  Endocrine Endocrine Symptoms Reported: No symptoms reported    Gastrointestinal Gastrointestinal Symptoms Reported: No symptoms reported      Genitourinary Genitourinary Symptoms Reported: No symptoms reported    Integumentary Integumentary Symptoms Reported: No symptoms reported    Musculoskeletal Musculoskelatal Symptoms Reviewed: No symptoms reported   Falls in the past year?: No    Psychosocial Psychosocial Symptoms Reported: No symptoms reported         There were no vitals filed for this visit.  Medications Reviewed Today     Reviewed by Ajay Strubel, RN (Case Manager) on 04/15/24 at 1310  Med List Status: <None>   Medication Order Taking? Sig Documenting Provider Last Dose Status Informant  acetaminophen  (TYLENOL ) 500 MG tablet  [id number]  Take 1,000 mg by mouth every 6 (six) hours as needed for mild pain (pain score 1-3). [provider]  Active Self, Pharmacy Records  albuterol  (VENTOLIN  HFA) 108 [phone] Base) MCG/ACT inhaler [id number]  Inhale 2 puffs into the lungs every 6 (six) hours as needed for wheezing or shortness of breath. Paseda, Folashade R, FNP  Active Self, Pharmacy Records  alendronate (FOSAMAX) 70 MG tablet [id number]  Take 70 mg by mouth once a week. [provider]  Active Self, Pharmacy Records  ipratropium-albuterol  (DUONEB) 0.5-2.5 (3) MG/3ML SOLN [id number]  Take 3 mLs by nebulization every 6 (six) hours as needed (for shortness of breath or wheezing). Maree, Pratik D, DO  Expired 04/08/24 2359 Self, Pharmacy Records  predniSONE  (DELTASONE ) 10 MG tablet [id number]  Take 6 tablets (60 mg total) by mouth daily with breakfast. And decrease by one tablet daily Tat, Alm, MD  Active   TRELEGY ELLIPTA 100-62.5-25 MCG/ACT AEPB [id number] Yes Inhale 1 puff into the lungs daily. [provider]  Active Self, Pharmacy Records            Goals Addressed             This Visit's Progress    [facility] Transitions of Care (TOC) Care Plan       Problems:  Recent Hospitalization for treatment of COPD Knowledge Deficit Related to COPD   Goal:  Over the next 30 days, the patient will not experience hospital readmission   Interventions:   COPD Interventions: Advised patient to track and manage COPD triggers Provided education about and advised patient to utilize infection prevention strategies to reduce risk  of respiratory infection Provided instruction about proper use of medications used for management of COPD including inhalers Discussed benefits of not  smoking such as: lungs will begin to clear and repair themselves, reduce risk of heart attack and stroke, less coughing, colds, and flu.   Patient Self Care Activities:  Attend all scheduled provider appointments Call pharmacy for  medication refills 3-7 days in advance of running out of medications Call provider office for new concerns or questions  Notify RN Care Manager of [facility] call rescheduling needs Participate in Transition of Care Program/Attend [facility] scheduled calls Perform all self care activities independently  Take medications as prescribed   listen for public air quality announcements every day eliminate symptom triggers at home Patient saw PCP 04/14/24  Plan:  Telephone follow up appointment with care management team member scheduled for: Rayyan Burley 04/24/2024 1:00      \ Recommendation:   Continue Current Plan of Care  Follow Up Plan:   Telephone follow-up in 1 week  Jaxxen Voong J. Kristyna Bradstreet RN, MSN [facility], [facility] RN Care Manager Direct Dial: [phone]  Fax: [phone] Website: [email or web address]

## 2024-04-24 ENCOUNTER — Other Ambulatory Visit: Payer: Self-pay

## 2024-04-24 NOTE — Transitions of Care (Post Inpatient/ED Visit) (Signed)
 Transition of Care week 3  Visit Note  04/24/2024  Name: Stephanie Hendrix MRN: [id number]          DOB: 1955/10/20  Situation: Patient enrolled in [facility] 30-day program. Visit completed with patient by telephone.   Background:   Initial Transition Care Management Follow-up Telephone Call    Past Medical History:  Diagnosis Date   Allergy    COPD (chronic obstructive pulmonary disease) (HCC)    Syncope    Tobacco abuse 12/22/2016    Assessment: Patient Reported Symptoms: Cognitive Cognitive Status: Alert and oriented to person, place, and time, Normal speech and language skills      Neurological Neurological Review of Symptoms: No symptoms reported    HEENT HEENT Symptoms Reported: No symptoms reported      Cardiovascular Cardiovascular Symptoms Reported: No symptoms reported    Respiratory Respiratory Symptoms Reported: No symptoms reported Additional Respiratory Details: Patient reports shortness of breath at times.  However has only used rescue inhaler about 2 times since hospitalization. Respiratory Management Strategies: Medication therapy Respiratory Self-Management Outcome: 4 (good)  Endocrine Endocrine Symptoms Reported: No symptoms reported    Gastrointestinal Gastrointestinal Symptoms Reported: No symptoms reported      Genitourinary Genitourinary Symptoms Reported: No symptoms reported    Integumentary Integumentary Symptoms Reported: No symptoms reported    Musculoskeletal Musculoskelatal Symptoms Reviewed: No symptoms reported        Psychosocial Psychosocial Symptoms Reported: No symptoms reported         There were no vitals filed for this visit.  Medications Reviewed Today     Reviewed by Morelia Cassells, RN (Case Manager) on 04/24/24 at 1307  Med List Status: <None>   Medication Order Taking? Sig Documenting Provider Last Dose Status Informant  acetaminophen  (TYLENOL ) 500 MG tablet [id number] Yes Take 1,000 mg by mouth every 6 (six) hours as  needed for mild pain (pain score 1-3). [provider]  Active Self, Pharmacy Records  albuterol  (VENTOLIN  HFA) 108 (90 Base) MCG/ACT inhaler [id number] Yes Inhale 2 puffs into the lungs every 6 (six) hours as needed for wheezing or shortness of breath. Paseda, Folashade R, FNP  Active Self, Pharmacy Records  alendronate (FOSAMAX) 70 MG tablet [id number] Yes Take 70 mg by mouth once a week. [provider]  Active Self, Pharmacy Records  ipratropium-albuterol  (DUONEB) 0.5-2.5 (3) MG/3ML SOLN [id number] Yes Take 3 mLs by nebulization every 6 (six) hours as needed (for shortness of breath or wheezing). Maree Bracken D, DO  Active Self, Pharmacy Records  nicotine (NICODERM CQ - DOSED IN MG/24 HOURS) 21 mg/24hr patch [id number] Yes Place 21 mg onto the skin daily. [provider]  Active   predniSONE  (DELTASONE ) 10 MG tablet [id number]  Take 6 tablets (60 mg total) by mouth daily with breakfast. And decrease by one tablet daily  Patient not taking: Reported on 04/24/2024   Evonnie Lenis, MD  Active   TRELEGY ELLIPTA 100-62.5-25 MCG/ACT AEPB [id number] Yes Inhale 1 puff into the lungs daily. [provider]  Active Self, Pharmacy Records            Goals Addressed             This Visit's Progress    VBCI Transitions of Care (TOC) Care Plan       Problems:  Recent Hospitalization for treatment of COPD Knowledge Deficit Related to COPD   Goal:  Over the next 30 days, the patient will not experience hospital readmission   Interventions:  COPD Interventions: Advised patient to track and manage COPD triggers Provided education about and advised patient to utilize infection prevention strategies to reduce risk of respiratory infection Provided instruction about proper use of medications used for management of COPD including inhalers Reviewed benefits of not  smoking such as: lungs will begin to clear and repair themselves, reduce risk of heart attack and stroke,  less coughing, colds, and flu.   Discussed with patient current smoking. She reports she is only smoking about 3 cigarettes per day down from 2 packs per day.  Congratulated patient and encouraged to continue trying.  She verbalized understanding.    Patient Self Care Activities:  Attend all scheduled provider appointments Call pharmacy for medication refills 3-7 days in advance of running out of medications Call provider office for new concerns or questions  Notify RN Care Manager of TOC call rescheduling needs Participate in Transition of Care Program/Attend TOC scheduled calls Perform all self care activities independently  Take medications as prescribed   listen for public air quality announcements every day eliminate symptom triggers at home   Plan:  Telephone follow up appointment with care management team member scheduled for: Fern Canova 04/30/2024 1:00 pm        Recommendation:   Continue Current Plan of Care  Follow Up Plan:   Telephone follow-up in 1 week  Jailen Coward J. Guelda Batson RN, MSN [facility], [facility] RN Care Manager Direct Dial: [phone]  Fax: [phone] Website: [email or web address] '

## 2024-04-24 NOTE — Patient Instructions (Signed)
 Visit Information  Thank you for taking time to visit with me today. Please don't hesitate to contact me if I can be of assistance to you before our next scheduled telephone appointment.  Our next appointment is by telephone on 04/30/24 at 100 pm  Following is a copy of your care plan:   Goals Addressed             This Visit's Progress    VBCI Transitions of Care (TOC) Care Plan       Problems:  Recent Hospitalization for treatment of COPD Knowledge Deficit Related to COPD   Goal:  Over the next 30 days, the patient will not experience hospital readmission   Interventions:   COPD Interventions: Advised patient to track and manage COPD triggers Provided education about and advised patient to utilize infection prevention strategies to reduce risk of respiratory infection Provided instruction about proper use of medications used for management of COPD including inhalers Reviewed benefits of not  smoking such as: lungs will begin to clear and repair themselves, reduce risk of heart attack and stroke, less coughing, colds, and flu.   Discussed with patient current smoking. She reports she is only smoking about 3 cigarettes per day down from 2 packs per day.  Congratulated patient and encouraged to continue trying.  She verbalized understanding.    Patient Self Care Activities:  Attend all scheduled provider appointments Call pharmacy for medication refills 3-7 days in advance of running out of medications Call provider office for new concerns or questions  Notify RN Care Manager of TOC call rescheduling needs Participate in Transition of Care Program/Attend TOC scheduled calls Perform all self care activities independently  Take medications as prescribed   listen for public air quality announcements every day eliminate symptom triggers at home   Plan:  Telephone follow up appointment with care management team member scheduled for: Kempton Milne 04/30/2024 1:00 pm         Patient verbalizes understanding of instructions and care plan provided today and agrees to view in MyChart. Active MyChart status and patient understanding of how to access instructions and care plan via MyChart confirmed with patient.     The patient has been provided with contact information for the care management team and has been advised to call with any health related questions or concerns.   Please call the care guide team at [phone] if you need to cancel or reschedule your appointment.   Please call the Suicide and Crisis Lifeline: 988 call the [place]  National Suicide Prevention Lifeline: 867-507-7309 or TTY: 205-588-3236 TTY 646 478 4193) to talk to a trained counselor if you are experiencing a Mental Health or Behavioral Health Crisis or need someone to talk to.  Caci Orren J. Roldan Laforest RN, MSN [facility], [facility] RN Care Manager Direct Dial: [phone]  Fax: [phone] Website: [email or web address]

## 2024-04-30 ENCOUNTER — Telehealth: Payer: Self-pay

## 2024-05-15 ENCOUNTER — Emergency Department (HOSPITAL_COMMUNITY): Admission: EM | Admit: 2024-05-15 | Discharge: 2024-05-15 | Discharge status: Absent without leave

## 2024-06-12 DIAGNOSIS — L299 Pruritus, unspecified: Secondary | ICD-10-CM | POA: Diagnosis not present

## 2024-06-12 DIAGNOSIS — R21 Rash and other nonspecific skin eruption: Secondary | ICD-10-CM | POA: Diagnosis not present

## 2024-06-12 DIAGNOSIS — J309 Allergic rhinitis, unspecified: Secondary | ICD-10-CM | POA: Diagnosis not present

## 2024-06-12 DIAGNOSIS — J4489 Other specified chronic obstructive pulmonary disease: Secondary | ICD-10-CM | POA: Diagnosis not present

## 2024-07-24 ENCOUNTER — Other Ambulatory Visit (HOSPITAL_COMMUNITY): Payer: Self-pay | Admitting: Adult Health

## 2024-07-24 DIAGNOSIS — F17210 Nicotine dependence, cigarettes, uncomplicated: Secondary | ICD-10-CM

## 2024-07-28 ENCOUNTER — Encounter (INDEPENDENT_AMBULATORY_CARE_PROVIDER_SITE_OTHER): Payer: Self-pay | Admitting: *Deleted

## 2024-08-07 ENCOUNTER — Ambulatory Visit (HOSPITAL_COMMUNITY): Admission: RE | Admit: 2024-08-07 | Source: Ambulatory Visit

## 2024-08-12 ENCOUNTER — Ambulatory Visit (HOSPITAL_COMMUNITY)
Admission: RE | Admit: 2024-08-12 | Discharge: 2024-08-12 | Disposition: A | Discharge status: Home / Self Care | Source: Ambulatory Visit | Attending: Adult Health | Admitting: Adult Health

## 2024-08-12 DIAGNOSIS — F17210 Nicotine dependence, cigarettes, uncomplicated: Secondary | ICD-10-CM | POA: Insufficient documentation

## 2024-08-16 ENCOUNTER — Emergency Department (HOSPITAL_COMMUNITY)

## 2024-08-16 ENCOUNTER — Other Ambulatory Visit: Payer: Self-pay

## 2024-08-16 ENCOUNTER — Emergency Department (HOSPITAL_COMMUNITY)
Admission: EM | Admit: 2024-08-16 | Discharge: 2024-08-17 | Disposition: A | Source: Ambulatory Visit | Attending: Emergency Medicine | Admitting: Emergency Medicine

## 2024-08-16 ENCOUNTER — Encounter (HOSPITAL_COMMUNITY): Payer: Self-pay

## 2024-08-16 DIAGNOSIS — R7401 Elevation of levels of liver transaminase levels: Secondary | ICD-10-CM | POA: Insufficient documentation

## 2024-08-16 DIAGNOSIS — Z9049 Acquired absence of other specified parts of digestive tract: Secondary | ICD-10-CM | POA: Insufficient documentation

## 2024-08-16 DIAGNOSIS — J449 Chronic obstructive pulmonary disease, unspecified: Secondary | ICD-10-CM | POA: Diagnosis not present

## 2024-08-16 DIAGNOSIS — L299 Pruritus, unspecified: Secondary | ICD-10-CM | POA: Diagnosis not present

## 2024-08-16 DIAGNOSIS — D259 Leiomyoma of uterus, unspecified: Secondary | ICD-10-CM | POA: Diagnosis not present

## 2024-08-16 DIAGNOSIS — E876 Hypokalemia: Secondary | ICD-10-CM | POA: Insufficient documentation

## 2024-08-16 DIAGNOSIS — K8689 Other specified diseases of pancreas: Secondary | ICD-10-CM | POA: Insufficient documentation

## 2024-08-16 DIAGNOSIS — R17 Unspecified jaundice: Secondary | ICD-10-CM | POA: Insufficient documentation

## 2024-08-16 DIAGNOSIS — F17210 Nicotine dependence, cigarettes, uncomplicated: Secondary | ICD-10-CM | POA: Insufficient documentation

## 2024-08-16 DIAGNOSIS — R748 Abnormal levels of other serum enzymes: Secondary | ICD-10-CM

## 2024-08-16 LAB — BASIC METABOLIC PANEL WITH GFR
Anion gap: 11 (ref 5–15)
BUN: 5 mg/dL — ABNORMAL LOW (ref 8–23)
CO2: 23 mmol/L (ref 22–32)
Calcium: 8.7 mg/dL — ABNORMAL LOW (ref 8.9–10.3)
Chloride: 103 mmol/L (ref 98–111)
Creatinine, Ser: 0.63 mg/dL (ref 0.44–1.00)
GFR, Estimated: >60 mL/min (ref >60)
Glucose, Bld: 88 mg/dL (ref 70–99)
Potassium: 3.3 mmol/L — ABNORMAL LOW (ref 3.5–5.1)
Sodium: 137 mmol/L (ref 135–145)

## 2024-08-16 LAB — URINALYSIS, ROUTINE W REFLEX MICROSCOPIC
Glucose, UA: NEGATIVE mg/dL
Hgb urine dipstick: NEGATIVE
Ketones, ur: NEGATIVE mg/dL
Leukocytes,Ua: NEGATIVE
Nitrite: NEGATIVE
Protein, ur: NEGATIVE mg/dL
Specific Gravity, Urine: >1.046 — ABNORMAL HIGH (ref 1.005–1.030)
pH: 6 (ref 5.0–8.0)

## 2024-08-16 LAB — CBC WITH DIFFERENTIAL/PLATELET
Abs Immature Granulocytes: 0.08 K/uL — ABNORMAL HIGH (ref 0.00–0.07)
Basophils Absolute: 0 K/uL (ref 0.0–0.1)
Basophils Relative: 0 %
Eosinophils Absolute: 0 K/uL (ref 0.0–0.5)
Eosinophils Relative: 0 %
HCT: 32.4 % — ABNORMAL LOW (ref 36.0–46.0)
Hemoglobin: 11.4 g/dL — ABNORMAL LOW (ref 12.0–15.0)
Immature Granulocytes: 1 %
Lymphocytes Relative: 22 %
Lymphs Abs: 2 K/uL (ref 0.7–4.0)
MCH: 23.8 pg — ABNORMAL LOW (ref 26.0–34.0)
MCHC: 35.2 g/dL (ref 30.0–36.0)
MCV: 67.5 fL — ABNORMAL LOW (ref 80.0–100.0)
Monocytes Absolute: 0.8 K/uL (ref 0.1–1.0)
Monocytes Relative: 9 %
Neutro Abs: 5.9 K/uL (ref 1.7–7.7)
Neutrophils Relative %: 68 %
Platelets: 373 K/uL (ref 150–400)
RBC: 4.8 MIL/uL (ref 3.87–5.11)
RDW: 23.5 % — ABNORMAL HIGH (ref 11.5–15.5)
Smear Review: NORMAL
WBC: 8.9 K/uL (ref 4.0–10.5)
nRBC: 0 % (ref 0.0–0.2)

## 2024-08-16 LAB — HEPATITIS PANEL, ACUTE
HCV Ab: NONREACTIVE
Hep A IgM: NONREACTIVE
Hep B C IgM: NONREACTIVE
Hepatitis B Surface Ag: NONREACTIVE

## 2024-08-16 LAB — PROTIME-INR
INR: 1.1 (ref 0.8–1.2)
Prothrombin Time: 14.4 s (ref 11.4–15.2)

## 2024-08-16 LAB — HEPATIC FUNCTION PANEL
ALT: 105 U/L — ABNORMAL HIGH (ref 0–44)
AST: 146 U/L — ABNORMAL HIGH (ref 15–41)
Albumin: 2.9 g/dL — ABNORMAL LOW (ref 3.5–5.0)
Alkaline Phosphatase: 1025 U/L — ABNORMAL HIGH (ref 38–126)
Bilirubin, Direct: 11.6 mg/dL — ABNORMAL HIGH (ref 0.0–0.2)
Indirect Bilirubin: 2.9 mg/dL — ABNORMAL HIGH (ref 0.3–0.9)
Total Bilirubin: 14.5 mg/dL — ABNORMAL HIGH (ref 0.0–1.2)
Total Protein: 6 g/dL — ABNORMAL LOW (ref 6.5–8.1)

## 2024-08-16 LAB — ACETAMINOPHEN LEVEL: Acetaminophen (Tylenol), Serum: <10 ug/mL — ABNORMAL LOW (ref 10–30)

## 2024-08-16 LAB — LIPASE, BLOOD: Lipase: 272 U/L — ABNORMAL HIGH (ref 11–51)

## 2024-08-16 MED ORDER — IOHEXOL 300 MG/ML  SOLN
100.0000 mL | Freq: Once | INTRAMUSCULAR | Status: AC | PRN
  Administered 2024-08-16: 100 mL via INTRAVENOUS

## 2024-08-16 MED ORDER — GADOBUTROL 1 MMOL/ML IV SOLN
5.0000 mL | Freq: Once | INTRAVENOUS | Status: AC | PRN
  Administered 2024-08-16: 5 mL via INTRAVENOUS

## 2024-08-16 MED ORDER — POTASSIUM CHLORIDE CRYS ER 20 MEQ PO TBCR
40.0000 meq | EXTENDED_RELEASE_TABLET | Freq: Once | ORAL | Status: AC
  Administered 2024-08-16: 40 meq via ORAL
  Filled 2024-08-16: qty 2

## 2024-08-16 NOTE — ED Provider Notes (Signed)
 [place] EMERGENCY DEPARTMENT AT [facility] Provider Note  CSN: [id number] Arrival date & time: 08/16/24 1508  Chief Complaint(s) Abnormal Lab  HPI Stephanie Hendrix is a 68 y.o. female with past medical history as below, significant for COPD, tobacco use, atherosclerosis, cholecystectomy who presents to the ED with complaint of abnormal labs, itching  Patient she has been itchy to her torso over the past 2 months, also noticed yellowing to her eyes around the onset of the pruritus.  No abdominal pain, no nausea or vomiting.  She takes Tylenol  periodically but not in excess.  Does not drink alcohol .  No fevers or chills, nausea or vomiting, no recent international travel.  No prior abdominal surgeries.  No change in bowel or bladder function.  Pruritus only reported to torso.  Past Medical History Past Medical History:  Diagnosis Date   Allergy    COPD (chronic obstructive pulmonary disease) (HCC)    Syncope    Tobacco abuse 12/22/2016   Patient Active Problem List   Diagnosis Date Noted   COPD exacerbation (HCC) 04/06/2024   Acute exacerbation of chronic obstructive pulmonary disease (COPD) (HCC) 04/05/2024   Acute respiratory failure with hypoxia and hypercapnia (HCC) 04/05/2024   Aortic atherosclerosis 03/01/2022   Annual physical exam 03/01/2022   Atherosclerosis of native coronary artery of native heart without angina pectoris 03/01/2022   COPD (chronic obstructive pulmonary disease) with emphysema (HCC) 03/01/2022   Insomnia 03/01/2022   Weight loss, non-intentional 03/01/2022   Hospital discharge follow-up 11/01/2021   Asthmatic bronchitis , chronic 10/26/2021   Cigarette smoker 10/26/2021   COPD with acute exacerbation (HCC) 10/22/2021   SOB (shortness of breath) 10/19/2021   Acute cough 10/19/2021   Acute left eye pain 12/24/2020   Encounter for gynecological examination with Papanicolaou smear of cervix 01/23/2020   Screening for colorectal cancer  01/23/2020   Tobacco use 01/23/2020   Encounter for smoking cessation counseling 01/23/2020   Acute pain of left shoulder 01/14/2020   Post-menopausal 01/14/2020   Encounter for screening for malignant neoplasm of cervix 01/14/2020   Encounter for mammogram to establish baseline mammogram 01/14/2020   Impaired fasting glucose 01/14/2020   Nicotine addiction 01/14/2020   Encounter for lipid screening for cardiovascular disease 01/14/2020   QT prolongation 01/14/2020   Encounter for screening for malignant neoplasm of colon 01/14/2020   Tobacco abuse 12/22/2016   Home Medication(s) Prior to Admission medications   Medication Sig Start Date End Date Taking? Authorizing Provider  acetaminophen  (TYLENOL ) 500 MG tablet Take 1,000 mg by mouth every 6 (six) hours as needed for mild pain (pain score 1-3).    [provider]  albuterol  (VENTOLIN  HFA) 108 (90 Base) MCG/ACT inhaler Inhale 2 puffs into the lungs every 6 (six) hours as needed for wheezing or shortness of breath. 10/19/21   Paseda, Folashade R, FNP  alendronate (FOSAMAX) 70 MG tablet Take 70 mg by mouth once a week. 06/06/23   [provider]  ipratropium-albuterol  (DUONEB) 0.5-2.5 (3) MG/3ML SOLN Take 3 mLs by nebulization every 6 (six) hours as needed (for shortness of breath or wheezing). 10/23/21 04/24/24  Maree, Pratik D, DO  nicotine (NICODERM CQ - DOSED IN MG/24 HOURS) 21 mg/24hr patch Place 21 mg onto the skin daily.    [provider]  predniSONE  (DELTASONE ) 10 MG tablet Take 6 tablets (60 mg total) by mouth daily with breakfast. And decrease by one tablet daily Patient not taking: Reported on 04/24/2024 04/08/24   Evonnie Lenis,  MD  TRELEGY ELLIPTA 100-62.5-25 MCG/ACT AEPB Inhale 1 puff into the lungs daily. 07/12/23   [provider]                                                                                                                                    Past Surgical History Past Surgical  History:  Procedure Laterality Date   CHOLECYSTECTOMY     Family History Family History  Problem Relation Age of Onset   COPD Mother    Cancer Father    Hypertension Sister    Diabetes Sister    Heart disease Sister    Breast cancer Maternal Aunt    Alcohol  abuse Brother    COPD Brother     Social History Social History   Tobacco Use   Smoking status: Every Day    Current packs/day: 1.00    Average packs/day: 1 pack/day for 43.0 years (43.0 ttl pk-yrs)    Types: Cigarettes   Smokeless tobacco: Never   Tobacco comments:    Cut down to 10 cig. Per day.   Vaping Use   Vaping status: Never Used  Substance Use Topics   Alcohol  use: No   Drug use: No   Allergies Fish allergy and Neomycin  Review of Systems A thorough review of systems was obtained and all systems are negative except as noted in the HPI and PMH.   Physical Exam Vital Signs  I have reviewed the triage vital signs BP 106/66   Pulse 64   Temp 98.1 F (36.7 C) (Axillary)   Resp 18   Wt 49 kg   SpO2 96%   BMI 17.43 kg/m  Physical Exam Vitals and nursing note reviewed.  Constitutional:      General: She is not in acute distress.    Appearance: Normal appearance. She is well-developed. She is not ill-appearing.  HENT:     Head: Normocephalic and atraumatic.     Right Ear: External ear normal.     Left Ear: External ear normal.     Nose: Nose normal.     Mouth/Throat:     Mouth: Mucous membranes are moist.     Comments: Infra lingual space is jaundiced Eyes:     General: Scleral icterus present.        Right eye: No discharge.        Left eye: No discharge.  Cardiovascular:     Rate and Rhythm: Normal rate.  Pulmonary:     Effort: Pulmonary effort is normal. No respiratory distress.     Breath sounds: No stridor.  Abdominal:     General: Abdomen is flat. There is no distension.     Palpations: Abdomen is soft.     Tenderness: There is no abdominal tenderness. There is no guarding.  Negative signs include Murphy's sign and Rovsing's sign.  Musculoskeletal:        General: No deformity.  Cervical back: No rigidity.  Skin:    General: Skin is warm and dry.     Coloration: Skin is not cyanotic, jaundiced or pale.     Comments: Chronic appearing papular/pustular lesions to her torso, excoriations noted   Neurological:     Mental Status: She is alert and oriented to person, place, and time.     GCS: GCS eye subscore is 4. GCS verbal subscore is 5. GCS motor subscore is 6.  Psychiatric:        Speech: Speech normal.        Behavior: Behavior normal. Behavior is cooperative.     ED Results and Treatments Labs (all labs ordered are listed, but only abnormal results are displayed) Labs Reviewed  CBC WITH DIFFERENTIAL/PLATELET - Abnormal; Notable for the following components:      Result Value   Hemoglobin 11.4 (*)    HCT 32.4 (*)    MCV 67.5 (*)    MCH 23.8 (*)    RDW 23.5 (*)    Abs Immature Granulocytes 0.08 (*)    All other components within normal limits  LIPASE, BLOOD - Abnormal; Notable for the following components:   Lipase 272 (*)    All other components within normal limits  URINALYSIS, ROUTINE W REFLEX MICROSCOPIC - Abnormal; Notable for the following components:   Color, Urine AMBER (*)    Specific Gravity, Urine >1.046 (*)    Bilirubin Urine MODERATE (*)    All other components within normal limits  HEPATIC FUNCTION PANEL - Abnormal; Notable for the following components:   Total Protein 6.0 (*)    Albumin 2.9 (*)    AST 146 (*)    ALT 105 (*)    Alkaline Phosphatase 1,025 (*)    Total Bilirubin 14.5 (*)    Bilirubin, Direct 11.6 (*)    Indirect Bilirubin 2.9 (*)    All other components within normal limits  BASIC METABOLIC PANEL WITH GFR - Abnormal; Notable for the following components:   Potassium 3.3 (*)    BUN 5 (*)    Calcium 8.7 (*)    All other components within normal limits  ACETAMINOPHEN  LEVEL - Abnormal; Notable for the  following components:   Acetaminophen  (Tylenol ), Serum <10 (*)    All other components within normal limits  PROTIME-INR  HEPATITIS PANEL, ACUTE                                                                                                                          Radiology MR ABDOMEN MRCP W WO CONTAST Result Date: 08/16/2024 CLINICAL DATA:  Biliary obstruction.  Abnormal CT scan. EXAM: MRI ABDOMEN WITHOUT AND WITH CONTRAST (INCLUDING MRCP) TECHNIQUE: Multiplanar multisequence MR imaging of the abdomen was performed both before and after the administration of intravenous contrast. Heavily T2-weighted images of the biliary and pancreatic ducts were obtained, and three-dimensional MRCP images were rendered by post processing. CONTRAST:  5mL GADAVIST  GADOBUTROL  1 MMOL/ML IV SOLN COMPARISON:  Remote CT scan from 2019 and abdominal CT scan 08/16/2024 FINDINGS: Lower chest: The lung bases are grossly clear. No pulmonary lesions, pleural or pericardial effusion. Hepatobiliary: Severe biliary dilatation markedly progressive since 2019. The common bile measures up to 21 mm. The gallbladder is surgically absent. No hepatic lesions are identified. The portal and hepatic veins are patent. Pancreas: Findings suspicious for a lower pancreatic head mass or ampullary lesion. It is somewhat difficult to evaluate given the distorted anatomy and massive biliary dilatation but best estimate is a 2 point 2.4 cm lesion causing obstruction of both common bile duct and the pancreatic duct. Spleen:  Normal size.  No focal lesions. Adrenals/Urinary Tract: Adrenal glands and kidneys unremarkable. No adrenal renal lesions. Stomach/Bowel: The stomach and duodenum are grossly normal. Again, there appears to be mass bulging into the duodenum possibly an ampullary tumor. The other visualized bowel is grossly normal. Vascular/Lymphatic: The aorta is normal in caliber. The branch vessels are patent. The major venous structures are patent.  Obvious abdominal lymphadenopathy. Other:  No ascites abdominal wall hernia. Musculoskeletal: No significant bony findings. IMPRESSION: 1. Severe biliary dilatation markedly progressive since 2019. 2. Findings suspicious for a lower pancreatic head mass or ampullary lesion. It is somewhat difficult to evaluate given the distorted anatomy and massive biliary dilatation but best estimate is a 2.4 cm lesion causing obstruction of both common bile duct and the pancreatic duct. 3. Recommend GI consult and ERCP for further evaluation. 4. No abdominal lymphadenopathy or metastatic disease. Electronically Signed   By: MYRTIS Stammer M.D.   On: 08/16/2024 21:06   MR 3D Recon At Scanner Result Date: 08/16/2024 CLINICAL DATA:  Biliary obstruction.  Abnormal CT scan. EXAM: MRI ABDOMEN WITHOUT AND WITH CONTRAST (INCLUDING MRCP) TECHNIQUE: Multiplanar multisequence MR imaging of the abdomen was performed both before and after the administration of intravenous contrast. Heavily T2-weighted images of the biliary and pancreatic ducts were obtained, and three-dimensional MRCP images were rendered by post processing. CONTRAST:  5mL GADAVIST  GADOBUTROL  1 MMOL/ML IV SOLN COMPARISON:  Remote CT scan from 2019 and abdominal CT scan 08/16/2024 FINDINGS: Lower chest: The lung bases are grossly clear. No pulmonary lesions, pleural or pericardial effusion. Hepatobiliary: Severe biliary dilatation markedly progressive since 2019. The common bile measures up to 21 mm. The gallbladder is surgically absent. No hepatic lesions are identified. The portal and hepatic veins are patent. Pancreas: Findings suspicious for a lower pancreatic head mass or ampullary lesion. It is somewhat difficult to evaluate given the distorted anatomy and massive biliary dilatation but best estimate is a 2 point 2.4 cm lesion causing obstruction of both common bile duct and the pancreatic duct. Spleen:  Normal size.  No focal lesions. Adrenals/Urinary Tract: Adrenal  glands and kidneys unremarkable. No adrenal renal lesions. Stomach/Bowel: The stomach and duodenum are grossly normal. Again, there appears to be mass bulging into the duodenum possibly an ampullary tumor. The other visualized bowel is grossly normal. Vascular/Lymphatic: The aorta is normal in caliber. The branch vessels are patent. The major venous structures are patent. Obvious abdominal lymphadenopathy. Other:  No ascites abdominal wall hernia. Musculoskeletal: No significant bony findings. IMPRESSION: 1. Severe biliary dilatation markedly progressive since 2019. 2. Findings suspicious for a lower pancreatic head mass or ampullary lesion. It is somewhat difficult to evaluate given the distorted anatomy and massive biliary dilatation but best estimate is a 2.4 cm lesion causing obstruction of both common bile duct and the pancreatic duct. 3. Recommend GI consult and ERCP for further  evaluation. 4. No abdominal lymphadenopathy or metastatic disease. Electronically Signed   By: MYRTIS Stammer M.D.   On: 08/16/2024 21:06   CT ABDOMEN PELVIS W CONTRAST Result Date: 08/16/2024 EXAM: CT ABDOMEN AND PELVIS WITH CONTRAST 08/16/2024 06:04:13 PM TECHNIQUE: CT of the abdomen and pelvis was performed with the administration of 100 mL of iohexol  (OMNIPAQUE ) 300 MG/ML solution. Multiplanar reformatted images are provided for review. Automated exposure control, iterative reconstruction, and/or weight-based adjustment of the mA/kV was utilized to reduce the radiation dose to as low as reasonably achievable. COMPARISON: 08/24/2018 CLINICAL HISTORY: Liver failure; jaundice x2 mos, pruritus, hyperbili, lft's +, alk phos ++ FINDINGS: LOWER CHEST: Lung bases are clear. No acute abnormality. LIVER: The liver is unremarkable. GALLBLADDER AND BILE DUCTS: Surgical absence of the gallbladder. Severe intrahepatic and extrahepatic bile duct dilatation, progressed since prior study. This likely indicates an ampullary mass lesion. Consider  follow-up MRI or endoscopic ultrasound for further evaluation. SPLEEN: No acute abnormality. PANCREAS: Diffuse pancreatic ductal dilatation. This likely indicates an ampullary mass lesion. Consider follow-up MRI or endoscopic ultrasound for further evaluation. ADRENAL GLANDS: No acute abnormality. KIDNEYS, URETERS AND BLADDER: No stones in the kidneys or ureters. No hydronephrosis. No perinephric or periureteral stranding. Urinary bladder is unremarkable. GI AND BOWEL: Stomach demonstrates no acute abnormality. There is no bowel obstruction. PERITONEUM AND RETROPERITONEUM: No ascites. No free air. VASCULATURE: Calcification of the aorta. No aortic aneurysm. LYMPH NODES: No lymphadenopathy. REPRODUCTIVE ORGANS: Nodular appearance of the uterus consistent with uterine fibroids. BONES AND SOFT TISSUES: No acute osseous abnormality. No focal soft tissue abnormality. IMPRESSION: 1. Severe intrahepatic and extrahepatic biliary dilatation progressed from prior with diffuse pancreatic ductal dilatation, findings most suggestive of an ampullary mass; recommend MRI or endoscopic ultrasound for further evaluation. 2. Surgical absence of the gallbladder. 3. Nodular uterine contour consistent with uterine fibroids. Electronically signed by: Elsie Gravely MD 08/16/2024 06:10 PM EST RP Workstation: [id number]    Pertinent labs & imaging results that were available during my care of the patient were reviewed by me and considered in my medical decision making (see MDM for details).  Medications Ordered in ED Medications  iohexol  (OMNIPAQUE ) 300 MG/ML solution 100 mL (100 mLs Intravenous Contrast Given 08/16/24 1748)  potassium chloride  SA (KLOR-CON  M) CR tablet 40 mEq (40 mEq Oral Given 08/16/24 1915)  gadobutrol  (GADAVIST ) 1 MMOL/ML injection 5 mL (5 mLs Intravenous Contrast Given 08/16/24 1902)                                                                                                                                      Procedures Procedures  (including critical care time)  Medical Decision Making / ED Course    Medical Decision Making:    JAKEIA CARRERAS is a 68 y.o. female  with past medical history as below, significant for COPD, tobacco use, atherosclerosis, cholecystectomy who presents to the ED with complaint of abnormal  labs, itchingThe complaint involves an extensive differential diagnosis and also carries with it a high risk of complications and morbidity.  Serious etiology was considered. Ddx includes but is not limited to: Cirrhosis, hepatic steatosis, NASH, biliary obstruction, cholecystitis, acetaminophen  overuse, liver failure, etc.  Complete initial physical exam performed, notably the patient was in acute distress.    Reviewed and confirmed nursing documentation for past medical history, family history, social history.  Vital signs reviewed.    Pancreatic mass hyperbilirubinemia > - no heavy etoh, no excessive apap use, no fever, no abd pain, no n/v, no stool change - hepatic function abnormal as outlined below - no abd pain, no n/v, no fever, no emesis, no change to stool - consulted GI as below - plan txfr to [facility] for advanced endo/gi, oncology    Clinical Course as of 08/16/24 2326  Sat Aug 16, 2024  1714 Hemoglobin(!): 11.4 Similar to prior [SG]  1714 Lipase(!): 272 Patient denies abdominal pain [SG]  1714 Liver enzymes acutely elevated, alk phos severely elevated, bilirubin is also severely elevated.  She is jaundiced with abdominal pain or fever.  No regular alcohol  or excessive use of acetaminophen .  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  Pending callback from medicine team [SG]    Clinical Course User Index [SG] Elnor Jayson LABOR, DO     Spoke w/ Dr Jerlean heme/onc at [facility], accepts pt for transfer EMTALA complete               Additional history obtained: -Additional history obtained from family -External records from outside source obtained and reviewed including: Chart review including previous notes, labs, imaging, consultation notes including  Prior labs Prior admission    Lab Tests: -I ordered, reviewed, and interpreted labs.   The pertinent results include:   Labs Reviewed  CBC WITH DIFFERENTIAL/PLATELET - Abnormal; Notable for the following components:      Result Value   Hemoglobin 11.4 (*)    HCT 32.4 (*)    MCV 67.5 (*)    MCH 23.8 (*)    RDW 23.5 (*)    Abs Immature Granulocytes 0.08 (*)    All other components within normal limits  LIPASE, BLOOD - Abnormal; Notable for the following components:   Lipase 272 (*)    All other components within normal limits  URINALYSIS, ROUTINE W REFLEX MICROSCOPIC - Abnormal; Notable for the following components:   Color, Urine AMBER (*)    Specific Gravity, Urine >1.046 (*)    Bilirubin Urine MODERATE (*)    All other components within normal limits  HEPATIC FUNCTION PANEL - Abnormal; Notable for the following components:   Total Protein 6.0 (*)    Albumin 2.9 (*)    AST 146 (*)    ALT 105 (*)    Alkaline Phosphatase 1,025 (*)    Total Bilirubin 14.5 (*)  Bilirubin, Direct 11.6 (*)    Indirect Bilirubin 2.9 (*)    All other components within normal limits  BASIC METABOLIC PANEL WITH GFR - Abnormal; Notable for the following components:   Potassium 3.3 (*)    BUN 5 (*)     Calcium 8.7 (*)    All other components within normal limits  ACETAMINOPHEN  LEVEL - Abnormal; Notable for the following components:   Acetaminophen  (Tylenol ), Serum <10 (*)    All other components within normal limits  PROTIME-INR  HEPATITIS PANEL, ACUTE    Notable for as above  EKG   EKG Interpretation Date/Time:    Ventricular Rate:    PR Interval:    QRS Duration:    QT Interval:    QTC Calculation:   R Axis:      Text Interpretation:           Imaging Studies ordered: I ordered imaging studies including CTAP, MRCP I independently visualized the following imaging with scope of interpretation limited to determining acute life threatening conditions related to emergency care; findings noted above I agree with the radiologist interpretation If any imaging was obtained with contrast I closely monitored patient for any possible adverse reaction a/w contrast administration in the emergency department   Medicines ordered and prescription drug management: Meds ordered this encounter  Medications   iohexol  (OMNIPAQUE ) 300 MG/ML solution 100 mL   potassium chloride  SA (KLOR-CON  M) CR tablet 40 mEq   gadobutrol  (GADAVIST ) 1 MMOL/ML injection 5 mL    -I have reviewed the patients home medicines and have made adjustments as needed   Consultations Obtained: I requested consultation with the Dr Cindie, Dr Kemp, Dr Maree Dr Jerlean,  and discussed lab and imaging findings as well as pertinent plan - they recommend: txfr [facility]   Cardiac Monitoring: The patient was maintained on a cardiac monitor.  I personally viewed and interpreted the cardiac monitored which showed an underlying rhythm of: nsr Continuous pulse oximetry interpreted by myself, 98% on RA.    Social Determinants of Health:  Diagnosis or treatment significantly limited by social determinants of health: current smoker Counseled patient for approximately 3 minutes regarding smoking cessation. Discussed risks of  smoking and how they applied and affected their visit here today. Patient not ready to quit at this time, however will follow up with their primary doctor when they are.   CPT code: 00593: intermediate counseling for smoking cessation     Reevaluation: After the interventions noted above, I reevaluated the patient and found that they have stayed the same  Co morbidities that complicate the patient evaluation  Past Medical History:  Diagnosis Date   Allergy    COPD (chronic obstructive pulmonary disease) (HCC)    Syncope    Tobacco abuse 12/22/2016      Dispostion: Disposition decision including need for hospitalization was considered, and patient transferred.    Final Clinical Impression(s) / ED Diagnoses Final diagnoses:  Jaundice  Hyperbilirubinemia  Elevated liver enzymes  Uterine leiomyoma, unspecified location  Hypokalemia  Pancreatic mass        Elnor Jayson LABOR, DO 08/16/24 2326

## 2024-08-16 NOTE — ED Triage Notes (Signed)
 Pt reports she has been itching x 1 month and saw her doctor yesterday and was told today to come to the ER for abnormal liver results.

## 2024-08-17 NOTE — ED Provider Notes (Signed)
 Patient evaluated prior to transport.  Is awake, alert.  Vital signs reviewed and reassuring.  EMTALA form completed.   [name], MD 08/17/24 [phone]

## 2024-08-18 LAB — CBG MONITORING, ED
Glucose-Capillary: 74 mg/dL (ref 70–99)
Glucose-Capillary: 71 mg/dL (ref 70–99)

## 2024-08-20 NOTE — Progress Notes (Signed)
 Case Management Discharge Note        CSN: [id number] DOB: 04/26/1956 Service: Oncology Location: C725/A  Patient Class: Inpatient  DC Disposition: : Home or Self Care  Discharge DC Disposition: : Home or Self Care  Discharge Referrals Case closed, patient/family agree with disposition plan: Yes  Case Management Coordination Status: Coordination Complete   Tinnie VEAR Haskell, RN

## 2024-08-20 NOTE — Care Plan (Addendum)
 Stephanie Hendrix received a diagnostic laparoscopy with R sided port placement on 11/25. She did well post operatively and advanced her diet as tolerated without nausea or vomiting. Her surgical incisions on her abdomen were clean dry and intact without oozing or signs of dehiscence. The port site was flushing adequately in the OR and looked clean on post operative check without hematoma formation or oozing from the incision.   We will arrange post operative follow up.  The patient is medically adequate to discharge from a Surgical Oncology perspective.  Rest of medical care per primary team.   Electronically signed by: Ludie Lonni School, MD 08/20/2024 9:00 AM

## 2024-08-20 NOTE — Telephone Encounter (Signed)
 I have spoken with Stephanie Hendrix at [facility]. Patient is now scheduled on 12/2 with Dr. Mickiel Dry at 11:30. Son is aware of appointment. Pathology report has been faxed and Stephanie Hendrix said they can see records in care everywhere.

## 2024-08-20 NOTE — Discharge Summary (Signed)
 ------------------------------------------------------------------------------- Attestation signed by Fredia Von Fletcher, MD at 08/20/2024  9:23 PM I saw and evaluated the patient and reviewed the above PA-C's note and agree with the findings, assessment and plan with any exceptions as noted below. I was present for the critical portion of the service for this patient and discussed the care and management of the patient's care with the PA-C. In doing so, the patient's chart , laboratory data, and imaging if available, has been reviewed.  Discharged home today with plan to have medonc appt locally. Jaundice improving.  -------------------------------------------------------------------------------  Hematology Oncology Discharge Summary Patient ID: Stephanie Hendrix [id number] 68 y.o.  [birth year]  Admit date: 08/17/2024 Admitting Physician: Fredia Von Fletcher, MD Admission Diagnoses: Pancreatic mass (CMD) [K86.89] Admission Condition: fair  Discharge date: Wed 08/20/2024  Discharge Physician: Fredia Von Fletcher, MD  Discharge Diagnoses:  Principal Problem:   Primary adenocarcinoma of ampulla of Vater    (CMD) Resolved Problems:   Biliary obstruction (CMD)   S/P ERCP  Discharged Condition: fair  Indication for Admission: malignant biliary obstruction, new ampullary mass  Hospital Course: For full HPI, please see H&P from 08/17/24. Briefly, Stephanie Hendrix is a 68 y.o. female who was admitted to [facility] service for management of malignant biliary obstruction and newly identified ampullary mass. The remainder of the hospital course will be summarized in problem based format below.  #Newly diagnosed periampullary adenocarcinoma  #Malignant biliary obstruction s/p stenting, POA, resolved #Obstructive jaundice, POA, improving Patient transferred from an OSH for further evaluation of concerns for pancreatic mass. Patient saw her PCP earlier in the week and labs were obtained for scleral icterus.  Patient was instructed to present to ED for abnormal liver values. On arrival to OSH, tbili noted to be 14.9 and ALP >1000. CT A/P with contrast was obtained at OSH concerning for severe intra- and extrahepatic biliary dilation suggestive of an ampullary mass and uterine fibroids. MRCP was completed and noted concerns for a 2.4 cm ampullary/pancreatic head mass causing obstruction with biliary ductal dilation. No lymph node or liver metastasis seen. She was transferred here for ongoing workup and evaluation with GI involvement. ROS notable for significant weight loss from prior 140 pounds to 108 pounds despite no abdominal pain or lack of appetite or changes in dietary habits. No other complaints. CT A/P W contrast on admission with periampullary pancreatic mass with biliary obstruction, and scattered liver lesions too small to characterize although MRCP at OSH with no liver metastasis. CT Chest WO contrast with no evidence of metastasis in the lungs. Patient ultimately underwent EUS/ERCP with GI with metal stent placement and biopsy of ampullary mass 08/18/24. Pathology resulted with poorly differentiated adenocarcinoma. Surgery consulted and patient underwent diagnostic laparoscopy 08/19/24 with no obvious metastatic lesions seen; bilious ascites seen and washout performed with ascites sent for cytology. Given hypoalbuminemia and poor nutritional status patient was referred to [facility] cancer center in [place] for neoadjuvant chemotherapy. Educated by RD and will continue protein supplements at home. Patient will follow up outpatient with surgery to continue discussing candidacy for surgical resection. Mild post-surgical leukocytosis of 14k noted without fever or signs or symptoms concerning for infection.   Daily labs including CDP, CMP and Mg were monitored. Patient was monitored daily, LFTs improved daily after biliary stenting and was deemed stable for discharge home once surgical team completed needed  workup. Patient was provided with appropriate prescriptions, and follow-up appointments were arranged prior to discharge. She will establish with [facility] within one  week of discharge on 08/26/24 with repeat labs. [facility] was [facility] with any questions or concerns.  #Anemia 2/2 chronic illness Patient was monitored with daily CBC. Inpatient transfusion goals were hgb >7 and platelet count >10K. Patient did not require any transfusions during admission. Folate, B12 and iron levels checked and all adequate.   The following chronic conditions were considered in the patient's care for this hospitalization; however there were no changes this hospitalization for medication regimen or management with respect to the following chronic, inactive, stable conditions:  - COPD, not in exacerbation: Continue home inhalers as hospital equivalent  - Tobacco use disorder: Ordered nicotine patch while inpatient  Current status: NAEON. Stephanie Hendrix reports very mild pain at her umbilicus from laparoscopy yesterday which was very well managed with a PRN tylenol . She is eating and drinking well. Moving her bowels. Denies fever, chills, night sweats, N/V/D/C, abdominal pain, CP, SOB. No questions or concerns.   Physical Exam:  Constitutional: Well developed 68 y.o. female in no acute distress. Musculoskeletal: Head is normocephalic and atraumatic; No cyanosis, edema or erythema noted in extremities. Eyes: PERRL, anicteric sclera, conjunctiva without injection;  ENT: oropharynx pink & moist without mucositis or thrush. Lymphatics: No submandibular, cervical, supraclavicular, or axillary adenopathy. Respiratory: CTAB with no wheezes, rhonchi, or rales. Non-labored.  Cardiac: RRR with no murmurs, rubs, or gallops. Gastrointestinal: Bowel sounds normoactive. Abdomen soft, non-tender, non-distended.  Neuro: Alert and oriented x3. Cranial nerves II-XII grossly intact. Moves all  extremities.  Skin: Warm and dry without bruising or rashes. Psych: Normal Hendrix and affect.   Access: Patient has a newly placed Port-a-Cath placed in OR on 08/19/24 which functioned properly during admission and showed no signs of infection.  Patient's Ordered Code Status: Full Code  Consults:  Consult Orders             IP CONSULT TO PULMONOLOGY       Specialty:  Pulmonary Disease  Provider:  (Not yet assigned)      IP CONSULT TO SURGICAL ONCOLOGY       Provider:  (Not yet assigned)      IP CONSULT TO GASTROENTEROLOGY       Specialty:  Gastroenterology  Provider:  (Not yet assigned)              Significant Diagnostic Studies:  Radiology Results (last 72 hours)     Procedure Component Value Units Date/Time   XR Chest 1 View [[id number]] Collected: 08/19/24 1408   Order Status: Completed Updated: 08/19/24 1433   Narrative:     XR CHEST 1 VIEW, 08/19/2024 12:22 PM  INDICATION:s/p Port placement; evaluate PTX  COMPARISON: CT chest 08/17/2024.  FINDINGS:   Supportive devices: Placement of right IJ approach Port-A-Cath with tip projecting over the mid SVC. Cardiovascular/lungs/pleura: Borderline enlarged cardiac silhouette with tortuous descending thoracic aorta. Aortic calcifications. Background changes of the lung with streaky and linear opacities, more prominent at the lung bases and the left midlung. No pleural effusion or pneumothorax.  Thoracic aorta calcification. Other: Unremarkable.    Impression:     1.  Placement of right IJ approach Port-A-Cath with tip projecting over the mid SVC. No pneumothorax. 2.  Background changes of the lung with streaky and linear opacities bilaterally, favored to be combination of atelectasis and/or chronic scarring.    FL Non Result Fluoro Up To 1 Hour [[id number]] Resulted: 08/19/24 1112   Order Status: Completed Updated: 08/19/24 1112   Narrative:  This order was created for film storage, please see performing providers   notes for details.   CT Chest WO Contrast [[id number]] Collected: 08/18/24 1038   Order Status: Completed Updated: 08/18/24 1105   Narrative:     CT CHEST WO CONTRAST, 08/17/2024 3:00 PM  INDICATION: staging for likely pancreatic cancer   COMPARISON: CT abdomen 08/17/2024  TECHNIQUE: Multislice axial images were obtained through the chest without administration of iodinated intravenous contrast material. Multi-planar reformatted images were generated for additional analysis. Nongated technique limits cardiac detail.  All CT scans at [facility] and [facility] are performed using radiation dose optimization techniques as appropriate to a performed exam, including but not limited to one or more of the following: automatic exposure control, adjustment of the mA and/or kV according to patient size, use of iterative reconstruction technique. In addition, our institution participates in a radiation dose monitoring program to optimize patient radiation exposure.  FINDINGS:   Tubes and Lines: None.  Thoracic inlet/central airways: Visualized thyroid  gland is normal.  Central airway is patent.  No cervical lymphadenopathy.   Mediastinum/hila/axilla: No adenopathy. Patulous esophagus.  Heart/vessels: Normal heart size. No pericardial effusion.  There is mild coronary artery calcification.   Aorta is normal in caliber.  Moderate atherosclerotic calcification.  Main pulmonary artery is not dilated.   Lungs/pleura: Moderate centrilobular emphysema. Bibasilar scarring/atelectasis. No focal airspace consolidations, pleural effusions, or pneumothoraces.  Upper abdomen: See separately dictated CT abdomen pelvis for findings below the diaphragm.  Chest wall/MSK: Osteopenia. Polyarticular degenerative change.    Impression:     No evidence of metastatic disease in the chest. See separately dictated CT abdomen pelvis for findings below the diaphragm.          Daily CBC, CMP, Mg Recent Results (from the past 24 hours)  Comprehensive Metabolic Panel   Collection Time: 08/20/24  7:43 AM  Result Value Ref Range   Sodium 133 (L) 136 - 145 mmol/L   Potassium 4.1 3.5 - 5.1 mmol/L   Chloride 101 98 - 107 mmol/L   CO2 30 21 - 31 mmol/L   Anion Gap 2 (L) 6 - 14 mmol/L   Glucose, Random 107 (H) 70 - 99 mg/dL   Blood Urea Nitrogen (BUN) 10 7 - 25 mg/dL   Creatinine 9.36 9.39 - 1.20 mg/dL   eGFR >09 >40 fO/fpw/8.26f7   Albumin 2.8 (L) 3.5 - 5.7 g/dL   Total Protein 5.5 (L) 6.4 - 8.9 g/dL   Bilirubin, Total 89.7 (H) 0.3 - 1.0 mg/dL   Alkaline Phosphatase (ALP) 753 (H) 34 - 104 U/L   Aspartate Aminotransferase (AST) 101 (H) 13 - 39 U/L   Alanine Aminotransferase (ALT) 74 (H) 7 - 52 U/L   Calcium 8.7 8.6 - 10.3 mg/dL   BUN/Creatinine Ratio     Corrected Calcium 9.7 mg/dL  Magnesium    Collection Time: 08/20/24  7:43 AM  Result Value Ref Range   Magnesium  1.8 (L) 1.9 - 2.7 mg/dL  CBC with Differential   Collection Time: 08/20/24  7:43 AM  Result Value Ref Range   WBC 14.40 (H) 4.40 - 11.00 10*3/uL   RBC 4.66 4.10 - 5.10 10*6/uL   Hemoglobin 11.0 (L) 12.3 - 15.3 g/dL   Hematocrit 66.3 (L) 64.0 - 44.6 %   Mean Corpuscular Volume (MCV) 72.0 (L) 80.0 - 96.0 fL   Mean Corpuscular Hemoglobin (MCH) 23.6 (L) 27.5 - 33.2 pg   Mean Corpuscular Hemoglobin Conc (MCHC) 32.8 (L)  33.0 - 37.0 g/dL   Red Cell Distribution Width (RDW) 19.9 (H) 12.3 - 17.0 %   Platelet Count (PLT) 334 150 - 450 10*3/uL   Mean Platelet Volume (MPV) 8.5 6.8 - 10.2 fL   Neutrophils % 71 %   Lymphocytes % 25 %   Monocytes % 4 %   Eosinophils % 0 %   Basophils % 0 %   Neutrophil Absolute (Man Diff) 10.20 (H) 1.80 - 7.80 10*3/uL   Lymphocytes Absolute (Man Diff) 3.60 1.00 - 4.80 10*3/uL   Monocytes Absolute (Man Diff) 0.60 0.00 - 0.80 10*3/uL   Eosinophils Absolute (Man Diff) 0.00 0.00 - 0.50 10*3/uL   Basophils Absolute (Man Diff) 0.00 0.00 - 0.20 10*3/uL   RBC & PLT  Morphology Reviewed    Target Cells 2+     Treatments:  IV fluids Home medications Biliary stenting   Procedures: - EUS/ERCP with metal stent 08/18/24 - Diagnostic laparoscopy and Port-a-Cath placement 08/19/24  Disposition: home  Patient Instructions:  Activity: Activity as Tolerated Diet: Regular Diet Wound Care: Regular Catheter Care Call office or seek medical care if: you experience a temperature >100.21F, intractable pain, nausea, vomiting, or any other concerning signs or symptoms.  Discharge Medications:   Medication List     START taking these medications    Creon 12,000-38,000 -60,000 unit capsule Generic drug: pancrelipase (Lip-Prot-Amyl) Take 2 capsules (24,000 units of lipase total) by mouth in the morning and 2 capsules (24,000 units of lipase total) at noon and 2 capsules (24,000 units of lipase total) in the evening. Take with meals.       CONTINUE taking these medications    * albuterol  2.5 mg /3 mL (0.083 %) nebulizer solution Take 2.5 mg by nebulization every 6 (six) hours as needed for wheezing or shortness of breath.   * albuterol  HFA 90 mcg/actuation inhaler Commonly known as: PROVENTIL  HFA;VENTOLIN  HFA;PROAIR  HFA Inhale 2 puffs every 6 (six) hours as needed for wheezing or shortness of breath.   cetirizine 10 mg tablet Commonly known as: ZyrTEC Take 10 mg by mouth daily as needed for allergies or rhinitis.   Trelegy Ellipta 100-62.5-25 mcg inhaler Generic drug: fluticasone-umeclidinium-vilanterol Inhale 1 puff every morning.   triamcinolone acetonide 0.1 % cream Commonly known as: KENALOG Apply topically daily as needed for irritation or rash.      * * This list has 2 medication(s) that are the same as other medications prescribed for you. Read the directions carefully, and ask your doctor or other care provider to review them with you.            Where to Get Your Medications     These medications were sent to [facility] [address] [place] [id number]    Hours: Open Monday 12am to Friday 11:59pm; Sat-Sun: Closed; Holidays: Closed Thanksgiving Phone: [phone]  Creon 12,000-38,000 -60,000 unit capsule    Follow-up Appointments No future appointments.  Time spent on discharge: 25  This is a shared visit with Dr. Jerlean.  Electronically signed by: Leretha Florie Hacker, NP-BC  Wed 08/20/2024 11:02 AM   *Some images could not be shown.

## 2024-08-22 ENCOUNTER — Telehealth: Payer: Self-pay

## 2024-08-22 NOTE — Transitions of Care (Post Inpatient/ED Visit) (Signed)
   08/22/2024  Name: Stephanie Hendrix MRN: [id number] DOB: 06/09/1956  Today's TOC FU Call Status: Today's TOC FU Call Status:: Unsuccessful Call (1st Attempt) Unsuccessful Call (1st Attempt) Date: 08/22/24  Attempted to reach the patient regarding the most recent Inpatient/ED visit.  Follow Up Plan: Additional outreach attempts will be made to reach the patient to complete the Transitions of Care (Post Inpatient/ED visit) call.   Melchor Kirchgessner J. Nyaira Hodgens RN, MSN [facility], [facility] RN Care Manager Direct Dial: [phone]  Fax: [phone] Website: [email or web address]

## 2024-08-25 ENCOUNTER — Telehealth: Payer: Self-pay

## 2024-08-25 NOTE — Transitions of Care (Post Inpatient/ED Visit) (Signed)
   08/25/2024  Name: Stephanie Hendrix MRN: [id number] DOB: 1956-04-15  Today's TOC FU Call Status: Today's TOC FU Call Status::  (Spoke with patient 08/25/24 who states she has changed PCP's and is now with [facility] - no further outreach indicated)  Patient's Name and Date of Birth confirmed.   Shona Prow RN, CCM [place]  [facility] RN Care Manager [phone]

## 2024-08-26 ENCOUNTER — Inpatient Hospital Stay: Attending: Oncology | Admitting: Oncology

## 2024-08-26 ENCOUNTER — Inpatient Hospital Stay

## 2024-08-26 ENCOUNTER — Encounter: Payer: Self-pay | Admitting: Oncology

## 2024-08-26 VITALS — BP 94/72 | HR 65 | Temp 97.2°F | Resp 17 | Ht 66.5 in | Wt 105.0 lb

## 2024-08-26 DIAGNOSIS — Z5111 Encounter for antineoplastic chemotherapy: Secondary | ICD-10-CM | POA: Insufficient documentation

## 2024-08-26 DIAGNOSIS — Z801 Family history of malignant neoplasm of trachea, bronchus and lung: Secondary | ICD-10-CM | POA: Insufficient documentation

## 2024-08-26 DIAGNOSIS — F17210 Nicotine dependence, cigarettes, uncomplicated: Secondary | ICD-10-CM | POA: Insufficient documentation

## 2024-08-26 DIAGNOSIS — R634 Abnormal weight loss: Secondary | ICD-10-CM | POA: Diagnosis not present

## 2024-08-26 DIAGNOSIS — C241 Malignant neoplasm of ampulla of Vater: Secondary | ICD-10-CM | POA: Diagnosis not present

## 2024-08-26 DIAGNOSIS — Z800 Family history of malignant neoplasm of digestive organs: Secondary | ICD-10-CM | POA: Diagnosis not present

## 2024-08-26 DIAGNOSIS — R911 Solitary pulmonary nodule: Secondary | ICD-10-CM | POA: Diagnosis not present

## 2024-08-26 DIAGNOSIS — Z803 Family history of malignant neoplasm of breast: Secondary | ICD-10-CM | POA: Diagnosis not present

## 2024-08-26 DIAGNOSIS — Z720 Tobacco use: Secondary | ICD-10-CM

## 2024-08-26 DIAGNOSIS — R21 Rash and other nonspecific skin eruption: Secondary | ICD-10-CM | POA: Insufficient documentation

## 2024-08-26 LAB — COMPREHENSIVE METABOLIC PANEL WITH GFR
ALT: 62 U/L — ABNORMAL HIGH (ref 0–44)
AST: 75 U/L — ABNORMAL HIGH (ref 15–41)
Albumin: 3.6 g/dL (ref 3.5–5.0)
Alkaline Phosphatase: 582 U/L — ABNORMAL HIGH (ref 38–126)
Anion gap: 14 (ref 5–15)
BUN: 10 mg/dL (ref 8–23)
CO2: 21 mmol/L — ABNORMAL LOW (ref 22–32)
Calcium: 9.3 mg/dL (ref 8.9–10.3)
Chloride: 103 mmol/L (ref 98–111)
Creatinine, Ser: 0.56 mg/dL (ref 0.44–1.00)
GFR, Estimated: >60 mL/min (ref >60)
Glucose, Bld: 101 mg/dL — ABNORMAL HIGH (ref 70–99)
Potassium: 4 mmol/L (ref 3.5–5.1)
Sodium: 139 mmol/L (ref 135–145)
Total Bilirubin: 5.2 mg/dL — ABNORMAL HIGH (ref 0.0–1.2)
Total Protein: 7.3 g/dL (ref 6.5–8.1)

## 2024-08-26 LAB — CBC WITH DIFFERENTIAL/PLATELET
Abs Immature Granulocytes: 0.11 K/uL — ABNORMAL HIGH (ref 0.00–0.07)
Basophils Absolute: 0 K/uL (ref 0.0–0.1)
Basophils Relative: 0 %
Eosinophils Absolute: 0.1 K/uL (ref 0.0–0.5)
Eosinophils Relative: 2 %
HCT: 37.8 % (ref 36.0–46.0)
Hemoglobin: 12 g/dL (ref 12.0–15.0)
Immature Granulocytes: 1 %
Lymphocytes Relative: 20 %
Lymphs Abs: 1.8 K/uL (ref 0.7–4.0)
MCH: 24 pg — ABNORMAL LOW (ref 26.0–34.0)
MCHC: 31.7 g/dL (ref 30.0–36.0)
MCV: 75.4 fL — ABNORMAL LOW (ref 80.0–100.0)
Monocytes Absolute: 0.9 K/uL (ref 0.1–1.0)
Monocytes Relative: 10 %
Neutro Abs: 5.9 K/uL (ref 1.7–7.7)
Neutrophils Relative %: 67 %
Platelets: 462 K/uL — ABNORMAL HIGH (ref 150–400)
RBC: 5.01 MIL/uL (ref 3.87–5.11)
RDW: 20.2 % — ABNORMAL HIGH (ref 11.5–15.5)
WBC: 8.8 K/uL (ref 4.0–10.5)
nRBC: 0 % (ref 0.0–0.2)

## 2024-08-26 NOTE — Patient Instructions (Addendum)
 [facility]  Discharge Instructions  You were seen and examined today by Dr. Davonna. Dr. Davonna is a medical oncologist, meaning that she specializes in the treatment of cancer diagnoses. Dr. Davonna discussed your past medical history, family history of cancers, and the events that led to you being here today.  You were referred to Dr. Davonna for a new diagnosis of Ampullary (Pancreatic) Cancer. Dr. Davonna has recommended additional labs today to be sure your liver function is stable for chemotherapy.  Dr. Davonna recommends starting chemotherapy if your labs allow it.  You will see our nutritionist, social worker and chemotherapy educator prior to starting treatment.  Follow-up as scheduled.  Thank you for choosing [facility] to provide your oncology and hematology care.   To afford each patient quality time with our provider, please arrive at least 15 minutes before your scheduled appointment time. You may need to reschedule your appointment if you arrive late (10 or more minutes). Arriving late affects you and other patients whose appointments are after yours.  Also, if you miss three or more appointments without notifying the office, you may be dismissed from the clinic at the provider's discretion.    Again, thank you for choosing [facility].  Our hope is that these requests will decrease the amount of time that you wait before being seen by our physicians.   If you have a lab appointment with the Cancer Center - please note that after April 8th, all labs will be drawn in the cancer center.  You do not have to check in or register with the main entrance as you have in the past but will complete your check-in at the cancer center.            _____________________________________________________________  Should you have questions after your visit to [facility], please contact our office at [phone] and follow the prompts.  Our office hours are 8:00 a.m. to 4:30 p.m. Monday - Thursday and 8:00 a.m. to 2:30 p.m. Friday.  Please note that voicemails left after 4:00 p.m. may not be returned until the following business day.  We are closed weekends and all major holidays.  You do have access to a nurse 24-7, just call the main number to the clinic [phone] and do not press any options, hold on the line and a nurse will answer the phone.    For prescription refill requests, have your pharmacy contact our office and allow 72 hours.    Masks are no longer required in the cancer centers. If you would like for your care team to wear a mask while they are taking care of you, please let them know. You may have one support person who is at least 70 years old accompany you for your appointments.

## 2024-08-26 NOTE — Progress Notes (Signed)
 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 Summary of oncologic history is as follows:  Diagnosis: Peri ampullary adenocarcinoma  Presentation: to the ED with jaundice and elevated LFT's on 08/05/2024, admitted from 08/17/2024 to 08/20/2024 for pancreatic mass and biliary obstruction -08/16/2024: Hepatic function panel: AST 146. ALT 105. Alkaline Phosphatase 1,025. Total bilirubin 14.5. Albumin 2.9. -08/16/2024: CT AP: Severe intrahepatic and extrahepatic biliary dilatation progressed from prior with diffuse pancreatic ductal dilatation, findings most suggestive of an ampullary mass; recommend MRI or endoscopic ultrasound for further evaluation. -08/16/2024: MRCP: Severe biliary dilatation markedly progressive since 2019. Findings suspicious for a lower pancreatic head mass or ampullary lesion. It is somewhat difficult to evaluate given the distorted anatomy and massive biliary dilatation but best estimate is a 2.4 cm lesion causing obstruction of both common bile duct and the pancreatic duct. No abdominal lymphadenopathy or metastatic disease. -08/17/2024: CT Chest: NED -08/17/2024: CA 19-9: 2,991. CA 125 Normal. CEA Normal.  -08/18/2024: EUS with ERCP and Ampullary mass : a large ulcerated mass in the area of the expected major papilla at the ampulla. The ulcer measured at least 30 mm in size. Duodenal ampullary mass core needle biopsy: Pathology: Poorly differentiated adenocarcinoma.  -08/19/2024: Peritoneal fluid cytology: No malignant cells identified.  - Seen by surgery at [facility] during this admission and recommended neoadjuvant chemotherapy   History of Presenting Illness: Stephanie Hendrix is a 68 y.o. female being  referred for poorly differentiated adenocarcinoma of the ampullary region by Dr. Fredia Fletcher. She is accompanied by her daughter today.   Patient has a medical history of COPD, atherosclerosis, syncope, and tobacco use. She has a surgical history of cholecystectomy.    Epiphany was admitted to the hospital due to labs done by her PCP for jaundice, itching rash along the abdomen and back.Labs revealed hyperbilirubinemia along with elevated ALP. She was then found to have an ampullary mass and had biopsy done finding adenocarcinoma.   She has had jaundiced eyes for the last few weeks and has had decreased activity levels over the past 2 months due to COPD exacerbation and recent hospital admission. Dearra appears weak today and has lost 40 pounds over the past 3 months. She notes diarrhea since August. Since a stent was placed during her ERCP, her diarrhea has resolved. She is not eating large meals as she is on a high protein diet, which causes early satiety after drinking a protein shake. We will refer her to nutrition. Prior to symptoms she was fully ambulatory and active.   We discussed future treatment, including chemotherapy and its side effects and duration, as well as possible surgery. She would like to have surgery done at [facility] if possible.   Multiple maternal aunts had breast cancer. One maternal aunt had stomach cancer. Her sister has lung cancer.  She is currently smoking 0.5 ppd, which is decreased from 1 ppd, and is attempting to quit.   Medical History: Past Medical History:  Diagnosis Date   Allergy    COPD (chronic obstructive pulmonary disease) (HCC)    Syncope    Tobacco abuse 12/22/2016    Surgical history: Past Surgical History:  Procedure Laterality Date   CHOLECYSTECTOMY       Allergies:  is allergic to levonorgestrel-ethinyl estrad, fish allergy,  and neomycin.  Medications:  Current Outpatient Medications  Medication Sig Dispense Refill   acetaminophen   (TYLENOL ) 500 MG tablet Take 1,000 mg by mouth every 6 (six) hours as needed for mild pain (pain score 1-3).     albuterol  (VENTOLIN  HFA) 108 (90 Base) MCG/ACT inhaler Inhale 2 puffs into the lungs every 6 (six) hours as needed for wheezing or shortness of breath. 8 g 0   alendronate (FOSAMAX) 70 MG tablet Take 70 mg by mouth once a week.     ipratropium-albuterol  (DUONEB) 0.5-2.5 (3) MG/3ML SOLN Take 3 mLs by nebulization every 6 (six) hours as needed (for shortness of breath or wheezing). 360 mL 1   lipase/protease/amylase (CREON) 12000-38000 units CPEP capsule Take by mouth.     predniSONE  (DELTASONE ) 10 MG tablet Take 6 tablets (60 mg total) by mouth daily with breakfast. And decrease by one tablet daily 21 tablet 0   TRELEGY ELLIPTA 100-62.5-25 MCG/ACT AEPB Inhale 1 puff into the lungs daily.     No current facility-administered medications for this visit.     Physical Examination: ECOG PERFORMANCE STATUS: 2 - Symptomatic, <50% confined to bed  Vitals:   08/26/24 1140  BP: 94/72  Pulse: 65  Resp: 17  Temp: (!) 97.2 F (36.2 C)  SpO2: 100%   Filed Weights   08/26/24 1140  Weight: 105 lb (47.6 kg)    GENERAL:Thin frail female in no acute distress SKIN: Diffuse papular hyperpigmented circular rash on the abdomen, chest and back.  No erythema or tenderness. LYMPH:  no palpable lymphadenopathy in the cervical, axillary or inguinal LUNGS: clear to auscultation and percussion with normal breathing effort HEART: regular rate & rhythm and no murmurs and no lower extremity edema ABDOMEN:abdomen soft, non-tender and normal bowel sounds Musculoskeletal:no cyanosis of digits and no clubbing  PSYCH: alert & oriented x 3 with fluent speech   Laboratory Data: I have reviewed the data as listed Lab Results  Component Value Date   WBC 8.8 08/26/2024   HGB 12.0 08/26/2024   HCT 37.8 08/26/2024   MCV 75.4 (L) 08/26/2024   PLT 462 (H) 08/26/2024      Chemistry      Component Value  Date/Time   NA 139 08/26/2024 1220   NA 141 03/15/2022 1121   K 4.0 08/26/2024 1220   CL 103 08/26/2024 1220   CO2 21 (L) 08/26/2024 1220   BUN 10 08/26/2024 1220   BUN 5 (L) 03/15/2022 1121   CREATININE 0.56 08/26/2024 1220      Component Value Date/Time   CALCIUM 9.3 08/26/2024 1220   ALKPHOS 582 (H) 08/26/2024 1220   AST 75 (H) 08/26/2024 1220   ALT 62 (H) 08/26/2024 1220   BILITOT 5.2 (H) 08/26/2024 1220   BILITOT <0.2 03/15/2022 1121      Labs from Care Everywhere: Admitted with a total bilirubin of 16.0 on 08/17/2024 CA 19-9: 2991, CEA: 2.3, CA125: 22, LDH: 255 Iron panel: TSAT: 63, ferritin: 657, TIBC: 257  Radiographic Studies: I have personally reviewed the radiological images as listed and agreed with the findings in the report.  CT CHEST LUNG CA SCREEN LOW DOSE W/O CM CLINICAL DATA:  138 pack-year smoking history.  Current smoker.  EXAM: CT CHEST WITHOUT CONTRAST LOW-DOSE FOR LUNG CANCER SCREENING  TECHNIQUE: Multidetector CT imaging of the chest was performed following the standard protocol without IV contrast.  RADIATION DOSE REDUCTION: This exam was performed according to the departmental dose-optimization program which includes automated exposure control,  adjustment of the mA and/or kV according to patient size and/or use of iterative reconstruction technique.  COMPARISON:  11/24/2021  FINDINGS: Cardiovascular: Aortic atherosclerosis. Normal heart size, without pericardial effusion. Lad and right coronary artery calcification.  Mediastinum/Nodes: No mediastinal or hilar adenopathy, given limitations of unenhanced CT.  Lungs/Pleura: No pleural fluid. Moderate centrilobular emphysema. Development of bilateral upper lobe irregular densities. Example right upper lobe at 6.5 mm on image 115. Left upper lobe at 6.4 mm on image 90.  Upper Abdomen: Development of moderate to marked intrahepatic biliary duct dilatation, as detailed on subsequent abdominal  CT of 08/16/2024. Normal imaged portions of the spleen, stomach.  Musculoskeletal: Mild convex right upper thoracic spine curvature. Mild osteopenia.  IMPRESSION: Lung-RADS 4A, suspicious. Follow up low-dose chest CT without contrast in 3 months (please use the following order, CT CHEST LCS NODULE FOLLOW-UP W/O CM) is recommended. Alternatively, PET may be considered when there is a solid component 8mm or larger. Bilateral upper lobe irregular nodular densities are new at maximally 6.5 mm.  Intrahepatic biliary duct dilatation, as evaluated on subsequent CT and MRCP [id number].  Aortic atherosclerosis (ICD10-I70.0), coronary artery atherosclerosis and emphysema (ICD10-J43.9).  These results will be called to the ordering clinician or representative by the Radiologist Assistant, and communication documented in the PACS or [employer].  Electronically Signed   By: Rockey Kilts M.D.   On: 08/18/2024 15:52    ASSESSMENT & PLAN:  Patient is a 68 y.o. female presenting for peri ampullary adenocarcinoma  Periampullary adenocarcinoma Diagnosed by ERCP, EUS at [facility]. CA 19-9: 2991 Evaluated by surgery at [facility] and recommended neoadjuvant chemotherapy  - Discussed various treatment options with the patient at this time.  Concerning patient is from I do not think she is a candidate for modified FOLFIRINOX at this time. - Discussed gemcitabine with Abraxane as a reasonable option. - Considering patient's age and comorbidities and somewhat limited functional status, I think gemcitabine with Abraxane is better tolerated than modified FOLFIRINOX. -The major side effects to include myelosuppression (notably neutropenia and thrombocytopenia), peripheral neuropathy (numbness, tingling, or pain in hands/feet), and substantial fatigue. Other frequent effects are gastrointestinal (nausea, vomiting, diarrhea), alopecia, rash, decreased  appetite, dehydration, and in some cases more serious issues like infection/sepsis or hepatotoxicity. - Patient already has a port placed. - Will obtain baseline labs today.  If patient continues to have elevated bilirubin, can consider doing gemcitabine alone for the first cycle followed by adding Abraxane when bilirubin improves. - Will repeat CT scan at 3 months. - Will schedule for chemotherapy education session - Will send for [employer] NGS on the tissue if there is enough tissue, if not we will send Guardant360 in her blood with next lab draw.  Return to clinic prior to second cycle of chemotherapy to assess for tolerance.   Rash Hyper pigmented rash over abdomen and chest. Sometimes itchy. Probably related to cholestasis  - Recommended cetrizine for itch - Will refer to dermatology for further evaluation.   Weight loss Likely secondary to ampullary adenocarcinoma.  Patient does have an appetite now but has early satiety.  Patient is attempting to cream weight with protein shakes.  - Will refer to nutrition for further assistance.  Lung nodule Patient has questionable lung nodules on screening CT scan done on 08/18/2024 here.  She did have a CT chest without contrast done on 08/17/2021 at [facility] which did not show these lesions.  - Will include a  chest CT with follow-up CT scan.  Tobacco use Patient was previously smoking 1 pack/day and has recently cut down to half a pack per day  -Recommended patient to slowly stop smoking.   Orders Placed This Encounter  Procedures   CBC with Differential    Standing Status:   Future    Number of Occurrences:   1    Expected Date:   08/26/2024    Expiration Date:   11/24/2024   Comprehensive metabolic panel    Standing Status:   Future    Number of Occurrences:   1    Expected Date:   08/26/2024    Expiration Date:   11/24/2024   Ambulatory referral to Genetics    Referral Priority:   Routine    Referral Type:   Consultation     Referral Reason:   Specialty Services Required    Number of Visits Requested:   1   Ambulatory Referral to [facility] Nutrition    Referral Priority:   Routine    Referral Type:   Consultation    Referral Reason:   Specialty Services Required    Number of Visits Requested:   1   Ambulatory referral to Social Work    Referral Priority:   Routine    Referral Type:   Consultation    Referral Reason:   Specialty Services Required    Number of Visits Requested:   1    The total time spent in the appointment was 60 minutes encounter with patients including review of chart and various tests results, discussions about plan of care and coordination of care plan   All questions were answered. The patient knows to call the clinic with any problems, questions or concerns. No barriers to learning was detected.  Mickiel Dry, MD 12/2/202511:18 PM

## 2024-08-26 NOTE — Progress Notes (Signed)
 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 2, BRCA1/2 and PALB2 Mutation Absent/Unknown Therapeutic Status: Preoperative, M0 (Clinical Staging) AJCC T Category: cT1 AJCC N Category: cN0 AJCC M Category: cM0 AJCC 8 Stage Grouping: IA ECOG Performance Status: 2 BRCA1/2 Mutation Status: Awaiting Test Results PALB2 Mutation Status: Awaiting Test Results Intent of Therapy: Curative Intent, Discussed with Patient

## 2024-08-27 ENCOUNTER — Other Ambulatory Visit: Payer: Self-pay

## 2024-08-27 ENCOUNTER — Inpatient Hospital Stay: Admitting: Licensed Clinical Social Worker

## 2024-08-27 ENCOUNTER — Other Ambulatory Visit: Payer: Self-pay | Admitting: Licensed Clinical Social Worker

## 2024-08-27 DIAGNOSIS — Z1379 Encounter for other screening for genetic and chromosomal anomalies: Secondary | ICD-10-CM

## 2024-08-27 DIAGNOSIS — C241 Malignant neoplasm of ampulla of Vater: Secondary | ICD-10-CM

## 2024-08-27 NOTE — Progress Notes (Signed)
 [facility] Clinical Social Work  Initial Assessment   Stephanie Hendrix is a 68 y.o. year old female contacted by phone. Clinical Social Work was referred by medical provider for assessment of psychosocial needs.   SDOH (Social Determinants of Health) assessments performed: Yes SDOH Interventions    Flowsheet Row Telephone from 04/08/2024 in [place] [facility] Clinical Support from 10/19/2022 in [facility] Clinical Support from 10/13/2021 in [facility] Clinical Support from 10/12/2020 in [facility]  SDOH Interventions      Food Insecurity Interventions Intervention Not Indicated Intervention Not Indicated Intervention Not Indicated Intervention Not Indicated  Housing Interventions Intervention Not Indicated Intervention Not Indicated [id number] Referral Intervention Not Indicated  Transportation Interventions Intervention Not Indicated, Patient Resources (Friends/Family) Intervention Not Indicated Intervention Not Indicated Intervention Not Indicated  Utilities Interventions Intervention Not Indicated Intervention Not Indicated -- --  Alcohol  Usage Interventions -- Intervention Not Indicated (Score <7) -- --  Financial Strain Interventions -- Intervention Not Indicated Intervention Not Indicated Intervention Not Indicated  Physical Activity Interventions -- Intervention Not Indicated Intervention Not Indicated Intervention Not Indicated  Stress Interventions -- Intervention Not Indicated Intervention Not Indicated Intervention Not Indicated  Social Connections Interventions -- Intervention Not Indicated Intervention Not Indicated Intervention Not Indicated    SDOH Screenings   Food Insecurity: Low Risk  (08/17/2024)   Received from [facility]  Housing: Low Risk  (08/17/2024)   Received from [facility]  Transportation Needs: No Transportation Needs (08/17/2024)   Received from [facility]   Utilities: Low Risk  (08/17/2024)   Received from [facility]  Alcohol  Screen: Low Risk  (10/19/2022)  Depression (PHQ2-9): Low Risk  (08/26/2024)  Financial Resource Strain: Low Risk  (10/19/2022)  Physical Activity: Sufficiently Active (10/19/2022)  Social Connections: Moderately Integrated (04/05/2024)  Stress: No Stress Concern Present (10/19/2022)  Tobacco Use: High Risk (08/26/2024)    PHQ 2/9:    08/26/2024   11:42 AM 04/08/2024   11:49 AM 10/19/2022    3:04 PM  Depression screen PHQ 2/9  Decreased Interest 0 0 0  Down, Depressed, Hopeless 0 0 0  PHQ - 2 Score 0 0 0     Distress Screen completed: No     No data to display            Family/Social Information:  Housing Arrangement: patient lives with her mother and her daughter.   Family members/support persons in your life? Pt's daughter takes care of both pt and her mother.  CSW spoke w/ pt about having both her PCP and her mother's PCP put in referrals for PCA services in order to see if her daughter could be a paid caretaker. Transportation concerns: no  Employment: Retired .  Income source: Actor concerns: Yes, due to illness and/or loss of work during treatment Type of concern: Medical bills Food access concerns: no Religious or spiritual practice: Yes-[employer] Advanced directives: No, pt informed of availability for CSW to complete documents. Services Currently in place:  none  Coping/ Adjustment to diagnosis: Patient understands treatment plan and what happens next? yes Concerns about diagnosis and/or treatment: Overwhelmed by information, How I will pay for the services I need, How will I care for myself, and Quality of life Patient reported stressors: Finances Hopes and/or priorities: pt's priority is to start treatment w/ the hope of positive results. Patient enjoys time with family/ friends Current coping skills/ strengths: Supportive family/friends  SUMMARY: Current  SDOH Barriers:  Financial constraints related to fixed income  Clinical Social Work Clinical Goal(s):  Explore community resource options for unmet needs related to:  Financial Strain   Interventions: Discussed common feeling and emotions when being diagnosed with cancer, and the importance of support during treatment Informed patient of the support team roles and support services at [facility] Provided CSW contact information and encouraged patient to call with any questions or concerns Referred patient to community resources: Wadie Rung.  Informed of eligibility requirements for [employer].  Pt to apply for SNAP benefits.    Follow Up Plan: Patient will contact CSW with any support or resource needs Patient verbalizes understanding of plan: Yes    Devere JONELLE Manna, LCSW Clinical Social Worker [facility]

## 2024-08-27 NOTE — Progress Notes (Signed)
 Pharmacist Chemotherapy Monitoring - Initial Assessment    Anticipated start date: 09/02/2024   The following has been reviewed per standard work regarding the patient's treatment regimen: The patient's diagnosis, treatment plan and drug doses, and organ/hematologic function Lab orders and baseline tests specific to treatment regimen  The treatment plan start date, drug sequencing, and pre-medications Prior authorization status  Patient's documented medication list, including drug-drug interaction screen and prescriptions for anti-emetics and supportive care specific to the treatment regimen The drug concentrations, fluid compatibility, administration routes, and timing of the medications to be used The patient's access for treatment and lifetime cumulative dose history, if applicable  The patient's medication allergies and previous infusion related reactions, if applicable   Changes made to treatment plan:  treatment plan date  Abraxane removed from first dose due to elevated tBili, will assess with D15.  MD removed day 8 from plan.  Follow up needed:  N/A   Niels FORBES Molt, [facility], 08/27/2024  2:24 PM

## 2024-08-28 ENCOUNTER — Other Ambulatory Visit: Payer: Self-pay

## 2024-08-29 MED ORDER — ONDANSETRON HCL 8 MG PO TABS: 8.0000 mg | ORAL_TABLET | Freq: Three times a day (TID) | ORAL | 1 refills | Status: AC | PRN

## 2024-08-29 MED ORDER — LIDOCAINE-PRILOCAINE 2.5-2.5 % EX CREA: TOPICAL_CREAM | CUTANEOUS | 3 refills | Status: AC

## 2024-08-29 MED ORDER — PROCHLORPERAZINE MALEATE 10 MG PO TABS: 10.0000 mg | ORAL_TABLET | Freq: Four times a day (QID) | ORAL | 1 refills | Status: AC | PRN

## 2024-08-29 NOTE — Patient Instructions (Addendum)
 [facility] Chemotherapy Teaching  You have been diagnosed with Pancreatic cancer by your oncologist. You will receive the treatment Abraxane and Gemzar on Day 1 and Day 8 every 28 days.  The goal of treatment is curative.   You will see the doctor regularly throughout treatment.  We will obtain blood work from you prior to every treatment and monitor your results to make sure it is safe to give your treatment. The doctor monitors your response to treatment by the way you are feeling, your blood work, and by obtaining scans periodically.  There will be wait times while you are here for treatment.  It will take about 30 minutes to 1 hour for your lab work to result.  Then there will be wait times while pharmacy mixes your medications.   Pre Meds before treatment:  Compazine : you will receive this medication before your treatment to help prevent any nausea.    Nab-paclitaxel (protein-bound) (Abraxane)  About This Drug Nab-paclitaxel is used to treat cancer. It is given in the vein (IV).  It takes 30 minutes to infuse.   Possible Side Effects  Bone marrow suppression. This is a decrease in the number of white blood cells, red blood cells, and platelets. This may raise your risk of infection, make you tired and weak (fatigue), and raise your risk of bleeding.   Abnormal heart beat, blood pressure, and/or abnormal EKG (electrocardiogram)   Nausea and vomiting (throwing up)   Diarrhea (loose bowel movements)   Tiredness, weakness   Swelling of your legs, ankles and/or feet   Fever   Changes in your liver function   Infection   Dehydration (lack of water in the body from losing too much fluid)   Decreased appetite (decreased hunger)   Joint, bone and muscle pain   Rash   Effects on the nerves are called peripheral neuropathy. You may feel numbness, tingling, or pain in your hands and feet. It may be hard for you to button your clothes, open jars, or walk as usual.  The effect on the nerves may get worse with more doses of the drug. These effects get better in some people after the drug is stopped but it does not get better in all people.   Hair loss. Hair loss is often temporary, although with certain medicine, hair loss can sometimes be permanent. Hair loss may happen suddenly or gradually. If you lose hair, you may lose it from your head, face, armpits, pubic area, chest, and/or legs. You may also notice your hair getting thin.  Note: Each of the side effects above was reported in 20% or greater of patients treated with nabpaclitaxel. Not all possible side effects are included above.  Warnings and Precautions  Severe bone marrow suppression   Inflammation (swelling) of the lungs. You may have a dry cough or trouble breathing.  Severe infections which can be life-threatening   Severe peripheral neuropathy   Allergic reactions, including anaphylaxis are rare but may happen in some patients, which can be life-threatening. Signs of allergic reaction to this drug may be swelling of the face, feeling like your tongue or throat are swelling, trouble breathing, rash, itching, fever, chills, feeling dizzy, and/or feeling that your heart is beating in a fast or not normal way. If this happens, do not take another dose of this drug. You should get urgent medical treatment.   This drug contains albumin, which is a protein from donated human blood. There is a rare risk  of transmission of viral diseases.  Note: Some of the side effects above are very rare. If you have concerns and/or questions, please discuss them with your medical team.  Important Information  This drug may be present in the saliva, tears, sweat, urine, stool, vomit, semen, and vaginal secretions. Talk to your doctor and/or your nurse about the necessary precautions to take during this time.  Treating Side Effects  To decrease the risk of infection, wash your hands regularly.   Avoid close  contact with people who have a cold, the flu, or other infections.   Take your temperature as your doctor or nurse tells you, and whenever you feel like you may have a fever.   To help decrease the risk of bleeding, use a soft toothbrush. Check with your nurse before using dental floss.   Be very careful when using knives or tools.   Use an electric shaver instead of a razor.   Manage tiredness by pacing your activities for the day. Be sure to include periods of rest between energy-draining activities.   Drink plenty of fluids (a minimum of eight glasses per day is recommended).   To help with decreased appetite, eat small, frequent meals. Eat foods high in calories and protein, such as meat, poultry, fish, dry beans, tofu, eggs, nuts, milk, yogurt, cheese, ice cream, pudding, and nutritional supplements.   Consider using sauces and spices to increase taste. Daily exercise, with your doctor's approval, may increase your appetite.   If you throw up or have loose bowel movements, you should drink more fluids so that you do not become dehydrated (lack of water in the body from losing too much fluid).   To help with nausea and vomiting, eat small, frequent meals instead of three large meals a day. Choose foods and drinks that are at room temperature. Ask your nurse or doctor about other helpful tips and medicine that is available to help stop or lessen these symptoms.   If you have diarrhea, eat low-fiber foods that are high in protein and calories and avoid foods that can irritate your digestive tracts or lead to cramping.   Ask your nurse or doctor about medicine that can lessen or stop your diarrhea.   To help with hair loss, wash hair with a mild shampoo and avoid washing your hair every day.   Avoid rubbing your scalp, pat your hair or scalp dry.   Avoid coloring your hair.   Limit your use of hair spray, electric curlers, blow dryers, and curling irons.   If you are  interested in getting a wig, talk to your nurse. You can also call the [employer] at 800-ACS-2345 to find out information about the "Look Good, Feel Better" program closeto where you live. It is a free program where women getting chemotherapy can learn about wigs, turbans and scarves as well as makeup techniques and skin and nail care.   Keeping your pain under control is important to your well-being. Please tell your doctor or nurse if you are experiencing pain.   If you get a rash do not put anything on it unless your doctor or nurse says you may. Keep the area around the rash clean and dry. Ask your doctor for medicine if your rash bothers you.   If you have numbness and tingling in your hands and feet, be careful when cooking, walking, and handling sharp objects and hot liquids.  Food and Drug Interactions  There are no known interactions  of nab-paclitaxel of with food.   This drug may interact with other medicines. Tell your doctor and pharmacist about all the prescription and over-the-counter medicines and dietary supplements (vitamins, minerals, herbs and others) that you are taking at this time. Also, check with your doctor or pharmacist before starting any new prescription or over-the-counter medicines, or dietary supplements to make sure that there are no interactions.  When to Call the Doctor Call your doctor or nurse if you have any of these symptoms and/or any new or unusual symptoms:   Fever of 100.4 F (38 C) or higher   Chills   Signs of a local infection such as pain, redness, tenderness, warmth and/or swelling   Easy bleeding or bruising   Wheezing or trouble breathing   Tiredness that interferes with your daily activities   Pain in your chest   Dry cough   Feeling dizzy or lightheaded   Feeling that your heart is beating in a fast or not normal way (palpitations)   Diarrhea, 4 times in one day or diarrhea with weakness or lightheadedness   Nausea  that stops you from eating or drinking and/or that isn't relieved by prescribed medicines   Throwing up   Lasting loss of appetite or rapid weight loss of five pounds in a week   Pain that does not go away or is not relieved by prescribed medicine   Numbness, tingling, or pain in your hands and feet   Weight gain of 5 pounds in one week (fluid retention)   Swelling of your legs, ankles and/or feet   Signs of liver problems: dark urine, pale bowel movements, bad stomach pain, feeling very tired and weak, unusual itching, or yellowing of the eyes or skin   Signs of allergic reaction: swelling of the face, feeling like your tongue or throat are swelling, trouble breathing, rash, itching, fever, chills, feeling dizzy, and/or feeling that your heart is beating in a fast or not normal way. If this happens, call 911 for emergency care.   New rash and/or itching   Rash that is not relieved by prescribed medicines   If you think you are pregnant or have impregnated your partner  Reproduction Warnings  Pregnancy warning: This drug can have harmful effects on the unborn baby. Women of childbearing potential should use effective methods of birth control during your cancer treatment and for at least 6 months after treatment. Men with female partners of childbearing potential should use effective methods of birth control during your cancer treatment and for at least 3 months after your cancer treatment. Let your doctor know right away if you think you may be pregnant or may have impregnated your partner.   Breastfeeding warning: It is not known if this drug passes into breast milk. For this reason, women should not breastfeed during treatment and for 2 weeks after treatment because this drug could enter the breast milk and cause harm to a breastfeeding baby.   Fertility Warning: In men and women both, this drug may affect your ability to have children in the future. Talk with your doctor or nurse if  you plan to have children. Ask for information on sperm or egg banking.  Gemcitabine (Gemzar)  About This Drug Gemcitabine is used to treat cancer. It is given in the vein (IV).  It will take 30 minutes to infuse.   Possible Side Effects  Bone marrow suppression. This is a decrease in the number of white blood cells, red blood cells,  and platelets. This may raise your risk of infection, make you tired and weak (fatigue), and raise your risk of bleeding   Fever   Trouble breathing   Nausea and throwing up (vomiting)   Changes in your liver function   Increased protein in your urine, which can affect how your kidneys work   Blood in your urine   Rash   Swelling of your legs, ankles and/or feet  Note: Each of the side effects above was reported in 20% or greater of patients treated with Gemcitabine. Not all possible side effects are included above.  Warnings and Precautions  Severe bone marrow suppression   Inflammation (swelling) of the lungs and/or thickening of the lung tissues, which may be lifethreatening. You may have a dry cough or trouble breathing.   Changes in your kidney function, which can cause kidney failure   Changes in your liver function, which can cause liver failure and may be life-threatening   If you have received radiation treatments, your skin may become red and/or you may develop soreness of the mouth and throat after gemcitabine. This reaction is called "recall." Your body is recalling, or remembering, that it had radiation therapy.   A syndrome where fluid from your veins can leak into your tissues and cause a decrease in your blood pressure and fluid to accumulate in your tissues and/or lungs.   A syndrome can occur that causes changes to kidney and liver function in combination with a decrease in red blood cells. Kidney failure may result which may be life-threatening.   Changes in your central nervous system can happen. The central nervous system  is made up of your brain and spinal cord. You could feel extreme tiredness, agitation, confusion, hallucinations (see or hear things that are not there), have trouble understanding or speaking, loss of control of your bowels or bladder, eyesight changes, numbness or lack of strength to your arms, legs, face, or body, seizures or coma. If you start to have any of these symptoms let your doctor know right away.  Note: Some of the side effects above are very rare. If you have concerns and/or questions, please discuss them with your medical team.  Important Information  This drug may be present in the saliva, tears, sweat, urine, stool, vomit, semen, and vaginal secretions. Talk to your doctor and/or your nurse about the necessary precautions to take during this time.  Treating Side Effects  Manage tiredness by pacing your activities for the day.   Be sure to include periods of rest between energy-draining activities.   To decrease the risk of infection, wash your hands regularly.   Avoid close contact with people who have a cold, the flu, or other infections.   Take your temperature as your doctor or nurse tells you, and whenever you feel like you may have a fever.   To help decrease bleeding, use a soft toothbrush. Check with your nurse before using dental floss.   Be very careful when using knives or tools.   Use an electric shaver instead of a razor.   Drink plenty of fluids (a minimum of eight glasses per day is recommended).   If you throw up or have loose bowel movements, you should drink more fluids so that you do not become dehydrated (lack of water in the body from losing too much fluid).   To help with nausea and vomiting, eat small, frequent meals instead of three large meals a day. Choose foods and drinks that  are at room temperature. Ask your nurse or doctor about other helpful tips and medicine that is available to help stop or lessen these symptoms.   If you get a rash do  not put anything on it unless your doctor or nurse says you may. Keep the area around the rash clean and dry. Ask your doctor for medicine if your rash bothers you.   If you received radiation, and your skin becomes red or irritated again, or you develop soreness of the mouth and throat, follow the same care instructions you did during radiation treatment. Be sure to tell the nurse or doctor administering your chemotherapy about your skin changes.  Food and Drug Interactions  There are no known interactions of gemcitabine with food.   This drug may interact with other medicines. Tell your doctor and pharmacist about all the prescription and over-the-counter medicines and dietary supplements (vitamins, minerals, herbs and others) that you are taking at this time. Also, check with your doctor or pharmacist before starting any new prescription or over-the-counter medicines, or dietary supplements to make sure that there are no interactions.  When to Call the Doctor Call your doctor or nurse if you have any of these symptoms and/or any new or unusual symptoms:   Fever of 100.4 F (38 C) or higher   Chills   Tiredness that interferes with your daily activities   Feeling dizzy or lightheaded   Pain in your chest   Dry cough   Wheezing and/or trouble breathing   Confusion and/or agitation   Symptoms of a seizure such as confusion, blacking out, passing out, loss of hearing or vision, blurred vision, unusual smells or tastes (such as burning rubber), trouble talking, tremors or shaking in parts or all of the body, repeated body movements, tense muscles that do not relax, and loss of control of urine and bowels. If you or your family member suspects you are having a seizure, call 911 right away.   Hallucinations   Trouble understanding or speaking   Blurry vision or changes in your eyesight   Numbness or lack of strength to your arms, legs, face, or body   Easy bleeding or bruising    Nausea that stops you from eating or drinking and/or is not relieved by prescribed medicines   Throwing up   Swelling of legs, ankles, or feet   Weight gain of 5 pounds in one week (fluid retention)   Blood in urine   Decreased urine or very dark urine   Foamy or bubbly-looking urine   A new rash/itching or a rash that is not relieved by prescribed medicines   Signs of possible liver problems: dark urine, pale bowel movements, bad stomach pain, feeling very tired and weak, unusual itching, or yellowing of the eyes or skin    If you think you may be pregnant or may have impregnated your partner  Reproduction Warnings   Pregnancy warning: This drug can have harmful effects on the unborn baby. Women of childbearing potential should use effective methods of birth control during your cancer treatment and for 6 months after treatment. Men with female partners of childbearing potential should use effective methods of birth control during your cancer treatment and for 3 months after your cancer treatment. Let your doctor know right away if you think you may be pregnant or may have impregnated your partner.   Breastfeeding warning: Women should not breastfeed during treatment and for 1 week after treatment because this drug could enter  the breast milk and cause harm to a breastfeeding baby.   Fertility warning: In men, this drug may affect your ability to have children in the future. Talk with your doctor or nurse if you plan to have children. Ask for information on sperm banking.  SELF CARE ACTIVITIES WHILE RECEIVING CHEMOTHERAPY:  Hydration Increase your fluid intake 48 hours prior to treatment and drink at least 8 to 12 cups (64 ounces) of water/decaffeinated beverages per day after treatment. You can still have your cup of coffee or soda but these beverages do not count as part of your 8 to 12 cups that you need to drink daily. No alcohol  intake.  Medications Continue taking your normal  prescription medication as prescribed.  If you start any new herbal or new supplements please let us  know first to make sure it is safe.  Mouth Care Have teeth cleaned professionally before starting treatment. Keep dentures and partial plates clean. Use soft toothbrush and do not use mouthwashes that contain alcohol . Biotene is a good mouthwash that is available at most pharmacies or may be ordered by calling [phone]. Use warm salt water gargles (1 teaspoon salt per 1 quart warm water) before and after meals and at bedtime. If you need dental work, please let the doctor know before you go for your appointment so that we can coordinate the best possible time for you in regards to your chemo regimen. You need to also let your dentist know that you are actively taking chemo. We may need to do labs prior to your dental appointment.  Skin Care Always use sunscreen that has not expired and with SPF (Sun Protection Factor) of 50 or higher. Wear hats to protect your head from the sun. Remember to use sunscreen on your hands, ears, face, & feet.  Use good moisturizing lotions such as udder cream, eucerin, or even Vaseline. Some chemotherapies can cause dry skin, color changes in your skin and nails.    Avoid long, hot showers or baths. Use gentle, fragrance-free soaps and laundry detergent. Use moisturizers, preferably creams or ointments rather than lotions because the thicker consistency is better at preventing skin dehydration. Apply the cream or ointment within 15 minutes of showering. Reapply moisturizer at night, and moisturize your hands every time after you wash them.  Hair Loss (if your doctor says your hair will fall out)  If your doctor says that your hair is likely to fall out, decide before you begin chemo whether you want to wear a wig. You may want to shop before treatment to match your hair color. Hats, turbans, and scarves can also camouflage hair loss, although some people prefer to  leave their heads uncovered. If you go bare-headed outdoors, be sure to use sunscreen on your scalp. Cut your hair short. It eases the inconvenience of shedding lots of hair, but it also can reduce the emotional impact of watching your hair fall out. Don't perm or color your hair during chemotherapy. Those chemical treatments are already damaging to hair and can enhance hair loss. Once your chemo treatments are done and your hair has grown back, it's OK to resume dyeing or perming hair.  With chemotherapy, hair loss is almost always temporary. But when it grows back, it may be a different color or texture. In older adults who still had hair color before chemotherapy, the new growth may be completely gray.  Often, new hair is very fine and soft.  Infection Prevention Please wash your hands for  at least 30 seconds using warm soapy water. Handwashing is the #1 way to prevent the spread of germs. Stay away from sick people or people who are getting over a cold. If you develop respiratory systems such as green/yellow mucus production or productive cough or persistent cough let us  know and we will see if you need an antibiotic. It is a good idea to keep a pair of gloves on when going into grocery stores/[employer] to decrease your risk of coming into contact with germs on the carts, etc. Carry alcohol  hand gel with you at all times and use it frequently if out in public. If your temperature reaches 100.5 or higher please call the clinic and let us  know.  If it is after hours or on the weekend please go to the ER if your temperature is over 100.5.  Please have your own personal thermometer at home to use.    Sex and bodily fluids If you are going to have sex, a condom must be used to protect the person that isn't taking chemotherapy. Chemo can decrease your libido (sex drive). For a few days after chemotherapy, chemotherapy can be excreted through your bodily fluids.  When using the toilet please close the lid and  flush the toilet twice.  Do this for a few day after you have had chemotherapy.   Effects of chemotherapy on your sex life Some changes are simple and won't last long. They won't affect your sex life permanently.  Sometimes you may feel: too tired not strong enough to be very active sick or sore  not in the mood anxious or low Your anxiety might not seem related to sex. For example, you may be worried about the cancer and how your treatment is going. Or you may be worried about money, or about how you family are coping with your illness.  These things can cause stress, which can affect your interest in sex. It's important to talk to your partner about how you feel.  Remember - the changes to your sex life don't usually last long. There's usually no medical reason to stop having sex during chemo. The drugs won't have any long term physical effects on your performance or enjoyment of sex. Cancer can't be passed on to your partner during sex  Contraception It's important to use reliable contraception during treatment. Avoid getting pregnant while you or your partner are having chemotherapy. This is because the drugs may harm the baby. Sometimes chemotherapy drugs can leave a man or woman infertile.  This means you would not be able to have children in the future. You might want to talk to someone about permanent infertility. It can be very difficult to learn that you may no longer be able to have children. Some people find counselling helpful. There might be ways to preserve your fertility, although this is easier for men than for women. You may want to speak to a fertility expert. You can talk about sperm banking or harvesting your eggs. You can also ask about other fertility options, such as donor eggs. If you have or have had breast cancer, your doctor might advise you not to take the contraceptive pill. This is because the hormones in it might affect the cancer. It is not known for sure whether or not  chemotherapy drugs can be passed on through semen or secretions from the vagina. Because of this some doctors advise people to use a barrier method if you have sex during treatment. This applies  to vaginal, anal or oral sex. Generally, doctors advise a barrier method only for the time you are actually having the treatment and for about a week after your treatment. Advice like this can be worrying, but this does not mean that you have to avoid being intimate with your partner. You can still have close contact with your partner and continue to enjoy sex.  Animals If you have cats or birds we just ask that you not change the litter or change the cage.  Please have someone else do this for you while you are on chemotherapy.   Food Safety During and After Cancer Treatment Food safety is important for people both during and after cancer treatment. Cancer and cancer treatments, such as chemotherapy, radiation therapy, and stem cell/bone marrow transplantation, often weaken the immune system. This makes it harder for your body to protect itself from foodborne illness, also called food poisoning. Foodborne illness is caused by eating food that contains harmful bacteria, parasites, or viruses.  Foods to avoid Some foods have a higher risk of becoming tainted with bacteria. These include: Unwashed fresh fruit and vegetables, especially leafy vegetables that can hide dirt and other contaminants Raw sprouts, such as alfalfa sprouts Raw or undercooked beef, especially ground beef, or other raw or undercooked meat and poultry Fatty, fried, or spicy foods immediately before or after treatment.  These can sit heavy on your stomach and make you feel nauseous. Raw or undercooked shellfish, such as oysters. Sushi and sashimi, which often contain raw fish.  Unpasteurized beverages, such as unpasteurized fruit juices, raw milk, raw yogurt, or cider Undercooked eggs, such as soft boiled, over easy, and poached; raw,  unpasteurized eggs; or foods made with raw egg, such as homemade raw cookie dough and homemade mayonnaise  Simple steps for food safety  Shop smart. Do not buy food stored or displayed in an unclean area. Do not buy bruised or damaged fruits or vegetables. Do not buy cans that have cracks, dents, or bulges. Pick up foods that can spoil at the end of your shopping trip and store them in a cooler on the way home.  Prepare and clean up foods carefully. Rinse all fresh fruits and vegetables under running water, and dry them with a clean towel or paper towel. Clean the top of cans before opening them. After preparing food, wash your hands for 20 seconds with hot water and soap. Pay special attention to areas between fingers and under nails. Clean your utensils and dishes with hot water and soap. Disinfect your kitchen and cutting boards using 1 teaspoon of liquid, unscented bleach mixed into 1 quart of water.    Dispose of old food. Eat canned and packaged food before its expiration date (the "use by" or "best before" date). Consume refrigerated leftovers within 3 to 4 days. After that time, throw out the food. Even if the food does not smell or look spoiled, it still may be unsafe. Some bacteria, such as Listeria, can grow even on foods stored in the refrigerator if they are kept for too long.  Take precautions when eating out. At restaurants, avoid buffets and salad bars where food sits out for a long time and comes in contact with many people. Food can become contaminated when someone with a virus, often a norovirus, or another "bug" handles it. Put any leftover food in a "to-go" container yourself, rather than having the server do it. And, refrigerate leftovers as soon as you get home. Choose restaurants  that are clean and that are willing to prepare your food as you order it cooked.   AT HOME MEDICATIONS:                                                                                                                                                                 Compazine /Prochlorperazine  10mg  tablet. Take 1 tablet every 6 hours as needed for nausea/vomiting. (This can make you sleepy)   EMLA  cream. Apply a quarter size amount to port site 1 hour prior to chemo. Do not rub in. Cover with plastic wrap.    Diarrhea Sheet   If you are having loose stools/diarrhea, please purchase Imodium and begin taking as outlined:  At the first sign of poorly formed or loose stools you should begin taking Imodium (loperamide) 2 mg capsules.  Take two tablets (4mg ) followed by one tablet (2mg ) every 2 hours - DO NOT EXCEED 8 tablets in 24 hours.  If it is bedtime and you are having loose stools, take 2 tablets at bedtime, then 2 tablets every 4 hours until morning.   Always call the Cancer Center if you are having loose stools/diarrhea that you can't get under control.  Loose stools/diarrhea leads to dehydration (loss of water) in your body.  We have other options of trying to get the loose stools/diarrhea to stop but you must let us  know!   Constipation Sheet  Colace - 100 mg capsules - take 2 capsules daily.  If this doesn't help then you can increase to 2 capsules twice daily.  Please call if the above does not work for you. Do not go more than 2 days without a bowel movement.  It is very important that you do not become constipated.  It will make you feel sick to your stomach (nausea) and can cause abdominal pain and vomiting.  Nausea Sheet   Compazine /Prochlorperazine  10mg  tablet. Take 1 tablet every 6 hours as needed for nausea/vomiting (This can make you drowsy).  If you are having persistent nausea (nausea that does not stop) please call the Cancer Center and let us  know the amount of nausea that you are experiencing.  If you begin to vomit, you need to call the Cancer Center and if it is the weekend and you have vomited more than one time and can't get it to stop-go to the Emergency  Room.  Persistent nausea/vomiting can lead to dehydration (loss of fluid in your body) and will make you feel very weak and unwell. Ice chips, sips of clear liquids, foods that are at room temperature, crackers, and toast tend to be better tolerated.   SYMPTOMS TO REPORT AS SOON AS POSSIBLE AFTER TREATMENT:  FEVER GREATER THAN 100.5 F  CHILLS WITH OR WITHOUT FEVER  NAUSEA AND  VOMITING THAT IS NOT CONTROLLED WITH YOUR NAUSEA MEDICATION  UNUSUAL SHORTNESS OF BREATH  UNUSUAL BRUISING OR BLEEDING  TENDERNESS IN MOUTH AND THROAT WITH OR WITHOUT   PRESENCE OF ULCERS  URINARY PROBLEMS  BOWEL PROBLEMS  UNUSUAL RASH     Wear comfortable clothing and clothing appropriate for easy access to any Portacath or PICC line. Let us  know if there is anything that we can do to make your therapy better!   What to do if you need assistance after hours or on the weekends: CALL [phone].  HOLD on the line, do not hang up.  You will hear multiple messages but at the end you will be connected with a nurse triage line.  They will contact the doctor if necessary.  Most of the time they will be able to assist you.  Do not call the hospital operator.     I have been informed and understand all of the instructions given to me and have received a copy. I have been instructed to call the clinic [phone] or my family physician as soon as possible for continued medical care, if indicated. I do not have any more questions at this time but understand that I may call the Cancer Center or the Patient Navigator at [phone] during office hours should I have questions or need assistance in obtaining follow-up care.

## 2024-09-01 ENCOUNTER — Inpatient Hospital Stay

## 2024-09-01 DIAGNOSIS — C241 Malignant neoplasm of ampulla of Vater: Secondary | ICD-10-CM

## 2024-09-02 ENCOUNTER — Inpatient Hospital Stay

## 2024-09-02 VITALS — BP 96/60 | HR 66 | Temp 96.8°F | Resp 18

## 2024-09-02 DIAGNOSIS — C241 Malignant neoplasm of ampulla of Vater: Secondary | ICD-10-CM

## 2024-09-02 DIAGNOSIS — Z5111 Encounter for antineoplastic chemotherapy: Secondary | ICD-10-CM | POA: Diagnosis not present

## 2024-09-02 LAB — CBC WITH DIFFERENTIAL/PLATELET
Basophils Absolute: 0.1 K/uL (ref 0.0–0.1)
Basophils Relative: 1 %
Eosinophils Absolute: 0.1 K/uL (ref 0.0–0.5)
Eosinophils Relative: 1 %
HCT: 36.2 % (ref 36.0–46.0)
Hemoglobin: 11.5 g/dL — ABNORMAL LOW (ref 12.0–15.0)
Lymphocytes Relative: 40 %
Lymphs Abs: 3.6 K/uL (ref 0.7–4.0)
MCH: 24.4 pg — ABNORMAL LOW (ref 26.0–34.0)
MCHC: 31.8 g/dL (ref 30.0–36.0)
MCV: 76.9 fL — ABNORMAL LOW (ref 80.0–100.0)
Monocytes Absolute: 0.2 K/uL (ref 0.1–1.0)
Monocytes Relative: 2 %
Neutro Abs: 5 K/uL (ref 1.7–7.7)
Neutrophils Relative %: 56 %
Platelets: 480 K/uL — ABNORMAL HIGH (ref 150–400)
RBC: 4.71 MIL/uL (ref 3.87–5.11)
RDW: 17.8 % — ABNORMAL HIGH (ref 11.5–15.5)
WBC: 9 K/uL (ref 4.0–10.5)
nRBC: 0 % (ref 0.0–0.2)

## 2024-09-02 LAB — COMPREHENSIVE METABOLIC PANEL WITH GFR
ALT: 67 U/L — ABNORMAL HIGH (ref 0–44)
AST: 64 U/L — ABNORMAL HIGH (ref 15–41)
Albumin: 3.5 g/dL (ref 3.5–5.0)
Alkaline Phosphatase: 366 U/L — ABNORMAL HIGH (ref 38–126)
Anion gap: 8 (ref 5–15)
BUN: 8 mg/dL (ref 8–23)
CO2: 27 mmol/L (ref 22–32)
Calcium: 9 mg/dL (ref 8.9–10.3)
Chloride: 105 mmol/L (ref 98–111)
Creatinine, Ser: 0.54 mg/dL (ref 0.44–1.00)
GFR, Estimated: >60 mL/min (ref >60)
Glucose, Bld: 98 mg/dL (ref 70–99)
Potassium: 3.6 mmol/L (ref 3.5–5.1)
Sodium: 140 mmol/L (ref 135–145)
Total Bilirubin: 3.5 mg/dL — ABNORMAL HIGH (ref 0.0–1.2)
Total Protein: 6.8 g/dL (ref 6.5–8.1)

## 2024-09-02 LAB — MAGNESIUM: Magnesium: 2.1 mg/dL (ref 1.7–2.4)

## 2024-09-02 MED ORDER — SODIUM CHLORIDE 0.9 % IV SOLN
1000.0000 mg/m2 | Freq: Once | INTRAVENOUS | Status: AC
  Administered 2024-09-02: 1482 mg via INTRAVENOUS
  Filled 2024-09-02: qty 38.98

## 2024-09-02 MED ORDER — PROCHLORPERAZINE MALEATE 10 MG PO TABS
10.0000 mg | ORAL_TABLET | Freq: Once | ORAL | Status: AC
  Administered 2024-09-02: 10 mg via ORAL
  Filled 2024-09-02: qty 1

## 2024-09-02 MED ORDER — SODIUM CHLORIDE 0.9 % IV SOLN: INTRAVENOUS | Status: DC

## 2024-09-02 NOTE — Patient Instructions (Signed)
 [facility] [place] - A DEPT OF [facility]  Discharge Instructions: Thank you for choosing [facility] to provide your oncology and hematology care.  If you have a lab appointment with the Cancer Center - please note that after April 8th, 2024, all labs will be drawn in the cancer center.  You do not have to check in or register with the main entrance as you have in the past but will complete your check-in in the cancer center.  Wear comfortable clothing and clothing appropriate for easy access to any Portacath or PICC line.   We strive to give you quality time with your provider. You may need to reschedule your appointment if you arrive late (15 or more minutes).  Arriving late affects you and other patients whose appointments are after yours.  Also, if you miss three or more appointments without notifying the office, you may be dismissed from the clinic at the provider's discretion.      For prescription refill requests, have your pharmacy contact our office and allow 72 hours for refills to be completed.    Today you received the following chemotherapy and/or immunotherapy agents Gemzar.       To help prevent nausea and vomiting after your treatment, we encourage you to take your nausea medication as directed.  BELOW ARE SYMPTOMS THAT SHOULD BE REPORTED IMMEDIATELY: *FEVER GREATER THAN 100.4 F (38 C) OR HIGHER *CHILLS OR SWEATING *NAUSEA AND VOMITING THAT IS NOT CONTROLLED WITH YOUR NAUSEA MEDICATION *UNUSUAL SHORTNESS OF BREATH *UNUSUAL BRUISING OR BLEEDING *URINARY PROBLEMS (pain or burning when urinating, or frequent urination) *BOWEL PROBLEMS (unusual diarrhea, constipation, pain near the anus) TENDERNESS IN MOUTH AND THROAT WITH OR WITHOUT PRESENCE OF ULCERS (sore throat, sores in mouth, or a toothache) UNUSUAL RASH, SWELLING OR PAIN  UNUSUAL VAGINAL DISCHARGE OR ITCHING   Items with * indicate a potential emergency and should be followed up  as soon as possible or go to the Emergency Department if any problems should occur.  Please show the CHEMOTHERAPY ALERT CARD or IMMUNOTHERAPY ALERT CARD at check-in to the Emergency Department and triage nurse.  Should you have questions after your visit or need to cancel or reschedule your appointment, please contact [facility] [place] - A DEPT OF [facility] [phone]  and follow the prompts.  Office hours are 8:00 a.m. to 4:30 p.m. Monday - Friday. Please note that voicemails left after 4:00 p.m. may not be returned until the following business day.  We are closed weekends and major holidays. You have access to a nurse at all times for urgent questions. Please call the main number to the clinic [phone] and follow the prompts.  For any non-urgent questions, you may also contact your provider using MyChart. We now offer e-Visits for anyone 64 and older to request care online for non-urgent symptoms. For details visit [email or web address].   Also download the MyChart app! Go to the app store, search MyChart, open the app, select [place], and log in with your MyChart username and password.

## 2024-09-02 NOTE — Progress Notes (Signed)
 Patient presents today for Gemzar  D1,C1 per providers order.  Vital signs within parameters for treatment.  Labs reviewed and total bili noted to be 3.5, NP notified.  Message received from Delon Hope NP, patient okay for treatment.  Patient has no new complaints at this time.  Treatment given today per MD orders.  Stable during infusion without adverse affects.  Vital signs stable.  No complaints at this time.  Discharge from clinic ambulatory in stable condition.  Alert and oriented X 3.  Follow up with [facility] as scheduled.

## 2024-09-02 NOTE — Progress Notes (Signed)

## 2024-09-04 ENCOUNTER — Telehealth: Payer: Self-pay

## 2024-09-04 ENCOUNTER — Encounter: Payer: Self-pay | Admitting: Licensed Clinical Social Worker

## 2024-09-04 ENCOUNTER — Inpatient Hospital Stay: Admitting: Licensed Clinical Social Worker

## 2024-09-04 ENCOUNTER — Inpatient Hospital Stay

## 2024-09-04 DIAGNOSIS — C241 Malignant neoplasm of ampulla of Vater: Secondary | ICD-10-CM

## 2024-09-04 DIAGNOSIS — Z803 Family history of malignant neoplasm of breast: Secondary | ICD-10-CM

## 2024-09-04 NOTE — Telephone Encounter (Signed)
 Chemotherapy 24 hour call.  No answer and no voice mail.

## 2024-09-04 NOTE — Progress Notes (Signed)
 REFERRING PROVIDER: Davonna Siad, MD [phone] S. [address] [place],  [place] [address]  PRIMARY PROVIDER:  [name], [name], NP  PRIMARY REASON FOR VISIT:  1. Ampullary carcinoma (HCC)   2. Family history of breast cancer    I connected with Ms. Roach on 09/04/2024 at 10:30 AM EDT by telephone and verified that I am speaking with the correct person using three identifiers.    Patient location: home Provider location: [facility]   HISTORY OF PRESENT ILLNESS:   Ms. Zavalza, a 68 y.o. female, was seen for a [place] cancer genetics consultation at the request of Dr. Davonna due to a personal and family history of cancer.  Ms. Poliquin presents to clinic today to discuss the possibility of a hereditary predisposition to cancer, genetic testing, and to further clarify her future cancer risks, as well as potential cancer risks for family members.   CANCER HISTORY:  Oncology History  Ampullary carcinoma (HCC)  08/26/2024 Initial Diagnosis   Ampullary carcinoma (HCC)   08/26/2024 Cancer Staging   Staging form: Ampulla of Vater, AJCC 8th Edition - Clinical stage from 08/26/2024: cT1, cN0, cM0 - Signed by Davonna Siad, MD on 08/26/2024 Stage prefix: Initial diagnosis Total positive nodes: 0   09/02/2024 -  Chemotherapy   Patient is on Treatment Plan : PANCREATIC Abraxane D1,8,15 + Gemcitabine  D1,8,15 q28d       Past Medical History:  Diagnosis Date   Allergy    COPD (chronic obstructive pulmonary disease) (HCC)    Syncope    Tobacco abuse 12/22/2016    Past Surgical History:  Procedure Laterality Date   CHOLECYSTECTOMY      FAMILY HISTORY:  We obtained a detailed, 4-generation family history.  Significant diagnoses are listed below: Family History  Problem Relation Age of Onset   COPD Mother    Hypertension Sister    Diabetes Sister    Heart disease Sister    Alcohol  abuse Brother    COPD Brother    Breast cancer Maternal Aunt        dx under 57, 4  maternal aunts   Stomach cancer Maternal Aunt    Ms. Linebaugh has 5 sons and 1 daughter. Her daughter had cervical cancer at 80.   Ms. Doke's mother is living at 43. Four maternal aunts had breast cancer under age 66 and another aunt had stomach cancer.   Ms. Federer's father passed at 92. No known cancers on his side of the family, but limited information about relatives on this side.   Ms. Mullinix is unaware of previous family history of genetic testing for hereditary cancer risks. There is no reported Ashkenazi Jewish ancestry. There is no known consanguinity.    GENETIC COUNSELING ASSESSMENT: Ms. Bas is a 68 y.o. female with a personal history of ampullary cancer and family history of breast cancer which is somewhat suggestive of a hereditary cancer syndrome and predisposition to cancer. We, therefore, discussed and recommended the following at today's visit.   DISCUSSION: We discussed that approximately 10% of cancer is hereditary. Most cases of hereditary breast/pancreatic cancer are associated with BRCA1/BRCA2 genes, although there are other genes associated with hereditary cancer as well. Cancers and risks are gene specific. We discussed that testing is beneficial for several reasons including knowing about cancer risks, identifying potential screening and risk-reduction options that may be appropriate, and to understand if other family members could be at risk for cancer and allow them to undergo genetic testing.   We reviewed  the characteristics, features and inheritance patterns of hereditary cancer syndromes. We also discussed genetic testing, including the appropriate family members to test, the process of testing, insurance coverage and turn-around-time for results. We discussed the implications of a negative, positive and/or variant of uncertain significant result. We recommended Ms. Radloff pursue genetic testing for the [facility] Common Hereditary Cancers+RNA gene panel.   The  Common Hereditary Cancers Panel + RNA offered by [facility] includes sequencing and/or deletion duplication testing of the following 48 genes: APC*, ATM*, AXIN2, BAP1, BARD1, BMPR1A, BRCA1, BRCA2, BRIP1, CDH1, CDK4, CDKN2A (p14ARF), CDKN2A (p16INK4a), CHEK2, CTNNA1, DICER1*, EPCAM*, FH*, GREM1*, HOXB13, KIT, MBD4, MEN1*, MLH1*, MSH2*, MSH3*, MSH6*, MUTYH, NF1*, NTHL1, PALB2, PDGFRA, PMS2*, POLD1*, POLE, PTEN*, RAD51C, RAD51D, SDHA*, SDHB, SDHC*, SDHD, SMAD4, SMARCA4, STK11, TP53, TSC1*, TSC2, VHL.   Based on Ms. Mccarry's personal and family history of cancer, she meets medical criteria for genetic testing. Despite that she meets criteria, she may still have an out of pocket cost.   PLAN: After considering the risks, benefits, and limitations, Ms. Loveland provided informed consent to pursue genetic testing and the blood sample was sent to [facility] for analysis of the Common Hereditary Cancers+RNA Panel. Results should be available within approximately 2-3 weeks' time, at which point they will be disclosed by telephone to Ms. Pedone, as will any additional recommendations warranted by these results. Ms. Sawhney will receive a summary of her genetic counseling visit and a copy of her results once available. This information will also be available in Epic.   Ms. Biddle's questions were answered to her satisfaction today. Our contact information was provided should additional questions or concerns arise. Thank you for the referral and allowing us  to share in the care of your patient.   Dena Cary, MS, [facility] Genetic Counselor [email or web address] Phone: [phone]  I personally spent a total of 40 minutes in the care of the patient today including counseling and educating, placing orders, and documenting clinical information in the EHR.  Dr. Delinda was available for discussion regarding this case.   _______________________________________________________________________ For  Office Staff:  Number of people involved in session: 1 Was an Intern/ student involved with case: no

## 2024-09-10 ENCOUNTER — Other Ambulatory Visit: Payer: Self-pay

## 2024-09-12 NOTE — Telephone Encounter (Signed)
 Appt has been scheduled and patient is aware. Thanks!

## 2024-09-12 NOTE — Telephone Encounter (Signed)
 Please schedule return visit with Dr. Laray in mid-February 2026 with same-day CT.   Thank you.

## 2024-09-15 ENCOUNTER — Inpatient Hospital Stay: Admitting: Dietician

## 2024-09-15 ENCOUNTER — Inpatient Hospital Stay

## 2024-09-15 ENCOUNTER — Ambulatory Visit: Payer: Self-pay | Admitting: Licensed Clinical Social Worker

## 2024-09-15 ENCOUNTER — Encounter: Payer: Self-pay | Admitting: Licensed Clinical Social Worker

## 2024-09-15 ENCOUNTER — Telehealth: Payer: Self-pay | Admitting: Licensed Clinical Social Worker

## 2024-09-15 VITALS — BP 90/56 | HR 60 | Temp 97.7°F | Resp 18

## 2024-09-15 DIAGNOSIS — Z1379 Encounter for other screening for genetic and chromosomal anomalies: Secondary | ICD-10-CM | POA: Insufficient documentation

## 2024-09-15 DIAGNOSIS — Z5111 Encounter for antineoplastic chemotherapy: Secondary | ICD-10-CM | POA: Diagnosis not present

## 2024-09-15 DIAGNOSIS — C241 Malignant neoplasm of ampulla of Vater: Secondary | ICD-10-CM

## 2024-09-15 LAB — CBC WITH DIFFERENTIAL/PLATELET
Abs Immature Granulocytes: 0.03 K/uL (ref 0.00–0.07)
Basophils Absolute: 0 K/uL (ref 0.0–0.1)
Basophils Relative: 0 %
Eosinophils Absolute: 0.2 K/uL (ref 0.0–0.5)
Eosinophils Relative: 3 %
HCT: 34.9 % — ABNORMAL LOW (ref 36.0–46.0)
Hemoglobin: 11.3 g/dL — ABNORMAL LOW (ref 12.0–15.0)
Immature Granulocytes: 0 %
Lymphocytes Relative: 36 %
Lymphs Abs: 2.6 K/uL (ref 0.7–4.0)
MCH: 25.3 pg — ABNORMAL LOW (ref 26.0–34.0)
MCHC: 32.4 g/dL (ref 30.0–36.0)
MCV: 78.1 fL — ABNORMAL LOW (ref 80.0–100.0)
Monocytes Absolute: 0.7 K/uL (ref 0.1–1.0)
Monocytes Relative: 10 %
Neutro Abs: 3.7 K/uL (ref 1.7–7.7)
Neutrophils Relative %: 51 %
Platelets: 389 K/uL (ref 150–400)
RBC: 4.47 MIL/uL (ref 3.87–5.11)
RDW: 16.3 % — ABNORMAL HIGH (ref 11.5–15.5)
WBC: 7.2 K/uL (ref 4.0–10.5)
nRBC: 0 % (ref 0.0–0.2)

## 2024-09-15 LAB — COMPREHENSIVE METABOLIC PANEL WITH GFR
ALT: 40 U/L (ref 0–44)
AST: 40 U/L (ref 15–41)
Albumin: 3.5 g/dL (ref 3.5–5.0)
Alkaline Phosphatase: 196 U/L — ABNORMAL HIGH (ref 38–126)
Anion gap: 13 (ref 5–15)
BUN: 6 mg/dL — ABNORMAL LOW (ref 8–23)
CO2: 22 mmol/L (ref 22–32)
Calcium: 9.1 mg/dL (ref 8.9–10.3)
Chloride: 104 mmol/L (ref 98–111)
Creatinine, Ser: 0.53 mg/dL (ref 0.44–1.00)
GFR, Estimated: >60 mL/min
Glucose, Bld: 105 mg/dL — ABNORMAL HIGH (ref 70–99)
Potassium: 4.1 mmol/L (ref 3.5–5.1)
Sodium: 139 mmol/L (ref 135–145)
Total Bilirubin: 2 mg/dL — ABNORMAL HIGH (ref 0.0–1.2)
Total Protein: 7 g/dL (ref 6.5–8.1)

## 2024-09-15 LAB — MAGNESIUM: Magnesium: 2 mg/dL (ref 1.7–2.4)

## 2024-09-15 MED ORDER — SODIUM CHLORIDE 0.9 % IV SOLN
1000.0000 mg/m2 | Freq: Once | INTRAVENOUS | Status: AC
  Administered 2024-09-15: 1482 mg via INTRAVENOUS
  Filled 2024-09-15: qty 38.98

## 2024-09-15 MED ORDER — PACLITAXEL PROTEIN-BOUND CHEMO INJECTION 100 MG
125.0000 mg/m2 | Freq: Once | INTRAVENOUS | Status: AC
  Administered 2024-09-15: 200 mg via INTRAVENOUS
  Filled 2024-09-15: qty 40

## 2024-09-15 MED ORDER — PROCHLORPERAZINE MALEATE 10 MG PO TABS
10.0000 mg | ORAL_TABLET | Freq: Once | ORAL | Status: AC
  Administered 2024-09-15: 10 mg via ORAL
  Filled 2024-09-15: qty 1

## 2024-09-15 MED ORDER — SODIUM CHLORIDE 0.9 % IV SOLN: INTRAVENOUS | Status: DC

## 2024-09-15 NOTE — Patient Instructions (Signed)
 [facility] [place] - A DEPT OF [facility]  Discharge Instructions: Thank you for choosing [facility] to provide your oncology and hematology care.  If you have a lab appointment with the Cancer Center - please note that after April 8th, 2024, all labs will be drawn in the cancer center.  You do not have to check in or register with the main entrance as you have in the past but will complete your check-in in the cancer center.  Wear comfortable clothing and clothing appropriate for easy access to any Portacath or PICC line.   We strive to give you quality time with your provider. You may need to reschedule your appointment if you arrive late (15 or more minutes).  Arriving late affects you and other patients whose appointments are after yours.  Also, if you miss three or more appointments without notifying the office, you may be dismissed from the clinic at the provider's discretion.      For prescription refill requests, have your pharmacy contact our office and allow 72 hours for refills to be completed.    Today you received the following chemotherapy and/or immunotherapy agents Abraxane/Gemzar      To help prevent nausea and vomiting after your treatment, we encourage you to take your nausea medication as directed.  BELOW ARE SYMPTOMS THAT SHOULD BE REPORTED IMMEDIATELY: *FEVER GREATER THAN 100.4 F (38 C) OR HIGHER *CHILLS OR SWEATING *NAUSEA AND VOMITING THAT IS NOT CONTROLLED WITH YOUR NAUSEA MEDICATION *UNUSUAL SHORTNESS OF BREATH *UNUSUAL BRUISING OR BLEEDING *URINARY PROBLEMS (pain or burning when urinating, or frequent urination) *BOWEL PROBLEMS (unusual diarrhea, constipation, pain near the anus) TENDERNESS IN MOUTH AND THROAT WITH OR WITHOUT PRESENCE OF ULCERS (sore throat, sores in mouth, or a toothache) UNUSUAL RASH, SWELLING OR PAIN  UNUSUAL VAGINAL DISCHARGE OR ITCHING   Items with * indicate a potential emergency and should be  followed up as soon as possible or go to the Emergency Department if any problems should occur.  Please show the CHEMOTHERAPY ALERT CARD or IMMUNOTHERAPY ALERT CARD at check-in to the Emergency Department and triage nurse.  Should you have questions after your visit or need to cancel or reschedule your appointment, please contact [facility]CER [facility] [place] - A DEPT OF [facility] [phone]  and follow the prompts.  Office hours are 8:00 a.m. to 4:30 p.m. Monday - Friday. Please note that voicemails left after 4:00 p.m. may not be returned until the following business day.  We are closed weekends and major holidays. You have access to a nurse at all times for urgent questions. Please call the main number to the clinic [phone] and follow the prompts.  For any non-urgent questions, you may also contact your provider using MyChart. We now offer e-Visits for anyone 30 and older to request care online for non-urgent symptoms. For details visit [email or web address].   Also download the MyChart app! Go to the app store, search "MyChart", open the app, select [place], and log in with your MyChart username and password.

## 2024-09-15 NOTE — Telephone Encounter (Signed)
 I contacted Ms. Latisha via [employer] to discuss her genetic testing results. No pathogenic variants were identified in the 48 genes analyzed. Detailed clinic note to follow.   The test report has been scanned into EPIC and is located under the Molecular Pathology section of the Results Review tab.  A portion of the result report is included below for reference.      Dena Cary, MS, [facility] Genetic Counselor [email or web address] Phone: [phone]

## 2024-09-15 NOTE — Progress Notes (Signed)
 HPI:   Ms. Sacra was previously seen in the [facility] due to a personal and family history of cancer and concerns regarding a hereditary predisposition to cancer. Please refer to our prior cancer genetics clinic note for more information regarding our discussion, assessment and recommendations, at the time. Ms. Mizrachi recent genetic test results were disclosed to her, as were recommendations warranted by these results. These results and recommendations are discussed in more detail below.  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 No pathogenic variants identified on the [facility] Common Hereditary Cancers+RNA Panel. The report date is 09/13/2024.  The Common Hereditary Cancers Panel + RNA offered by [facility] includes sequencing and/or deletion duplication testing of the following 48 genes: APC*, ATM*, AXIN2, BAP1, BARD1, BMPR1A, BRCA1, BRCA2, BRIP1, CDH1, CDK4, CDKN2A (p14ARF), CDKN2A (p16INK4a), CHEK2, CTNNA1, DICER1*, EPCAM*, FH*, GREM1*, HOXB13, KIT, MBD4, MEN1*, MLH1*, MSH2*, MSH3*, MSH6*, MUTYH, NF1*, NTHL1, PALB2, PDGFRA, PMS2*, POLD1*, POLE, PTEN*, RAD51C, RAD51D, SDHA*, SDHB, SDHC*, SDHD, SMAD4, SMARCA4, STK11, TP53, TSC1*, TSC2, VHL.       FAMILY HISTORY:  We obtained a detailed, 4-generation family history.  Significant diagnoses are listed below: Family History  Problem Relation Age of Onset   COPD Mother    Hypertension Sister    Diabetes Sister    Heart disease Sister    Alcohol  abuse  Brother    COPD Brother    Breast cancer Maternal Aunt        dx under 74, 4 maternal aunts   Stomach cancer Maternal Aunt    Ms. Shetley has 5 sons and 1 daughter. Her daughter had cervical cancer at 65.    Ms. Cartlidge's mother is living at 48. Four maternal aunts had breast cancer under age 70 and another aunt had stomach cancer.    Ms. Ebbert's father passed at 65. No known cancers on his side of the family, but limited information about relatives on this side.    Ms. Kimmet is unaware of previous family history of genetic testing for hereditary cancer risks. There is no reported Ashkenazi Jewish ancestry. There is no known consanguinity.      GENETIC TEST RESULTS:  The [facility] Common Hereditary Cancers+RNA Panel found no pathogenic mutations.   The Common Hereditary Cancers Panel + RNA offered by [facility] includes sequencing and/or deletion duplication testing of the following 48 genes: APC*, ATM*, AXIN2, BAP1, BARD1, BMPR1A, BRCA1, BRCA2, BRIP1, CDH1, CDK4, CDKN2A (p14ARF), CDKN2A (p16INK4a), CHEK2, CTNNA1, DICER1*, EPCAM*, FH*, GREM1*, HOXB13, KIT, MBD4, MEN1*, MLH1*, MSH2*, MSH3*, MSH6*, MUTYH, NF1*, NTHL1, PALB2, PDGFRA, PMS2*, POLD1*, POLE, PTEN*, RAD51C, RAD51D, SDHA*, SDHB, SDHC*, SDHD, SMAD4, SMARCA4, STK11, TP53, TSC1*, TSC2, VHL.   The test report has been scanned into EPIC and is located under the Molecular Pathology section of the Results Review tab.  A portion of the result report is included below for reference. Genetic testing reported out on 09/13/2024.      Even though a pathogenic variant was not identified, possible explanations for the cancer in  the family may include: There may be no hereditary risk for cancer in the family. The cancers in Ms. Sohm and/or her family may be sporadic/familial or due to other genetic and environmental factors. There may be a gene mutation in one of these genes that current testing methods cannot detect but that chance is  small. There could be another gene that has not yet been discovered, or that we have not yet tested, that is responsible for the cancer diagnoses in the family.  It is also possible there is a hereditary cause for the cancer in the family that Ms. Tate did not inherit.  Therefore, it is important to remain in touch with cancer genetics in the future so that we can continue to offer Ms. Cavenaugh the most up to date genetic testing.   ADDITIONAL GENETIC TESTING:  Ms Martel's genetic testing was fairly extensive.  If there are additional relevant genes identified to increase cancer risk that can be analyzed in the future, we would be happy to discuss and coordinate this testing at that time.    CANCER SCREENING RECOMMENDATIONS:  Ms. Alfredo test result is considered negative (normal).  This means that we have not identified a hereditary cause for her personal and family history of cancer at this time.   An individual's cancer risk and medical management are not determined by genetic test results alone. Overall cancer risk assessment incorporates additional factors, including personal medical history, family history, and any available genetic information that may result in a personalized plan for cancer prevention and surveillance. Therefore, it is recommended she continue to follow the cancer management and screening guidelines provided by her oncology and primary healthcare provider.  RECOMMENDATIONS FOR FAMILY MEMBERS:   Since she did not inherit a identifiable mutation in a cancer predisposition gene included on this panel, her children could not have inherited a known mutation from her in one of these genes. Individuals in this family might be at some increased risk of developing cancer, over the general population risk, due to the family history of cancer.  Individuals in the family should notify their providers of the family history of cancer. We recommend women in this family have a yearly  mammogram beginning at age 10, or 38 years younger than the earliest onset of cancer, an annual clinical breast exam, and perform monthly breast self-exams.  Family members should have colonoscopies by at age 67, or earlier, as recommended by their providers. Other members of the family may still carry a pathogenic variant in one of these genes that Ms. Dehart did not inherit. Based on the family history, we recommend her maternal relatives have genetic counseling and testing. Ms. Bollier will let us  know if we can be of any assistance in coordinating genetic counseling and/or testing for this family member.    FOLLOW-UP:  Lastly, we discussed with Ms. Pigeon that cancer genetics is a rapidly advancing field and it is possible that new genetic tests will be appropriate for her and/or her family members in the future. We encouraged her to remain in contact with cancer genetics on an annual basis so we can update her personal and family histories and let her know of advances in cancer genetics that may benefit this family.   Our contact number was provided. Ms. Boreman's questions were answered to her satisfaction, and she knows she is welcome to call us  at anytime with additional questions or concerns.    Dena Cary, MS, [facility] Genetic Counselor [email or web address] Phone: [phone]

## 2024-09-15 NOTE — Progress Notes (Signed)
 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 She is receiving neoadjuvant chemotherapy with abraxane  + gemcitabine .   Past medical history includes atherosclerosis, COPD, asthmatic bronchitis, emphysema, tobacco use, insomnia  Met with patient in infusion. Son is present at visit. Patient reports tolerating first chemo without any side effects. She is grateful for this. Patient reports poor appetite at baseline. Son noting patient along with her mother and grandmother never eat. She usually has one small meal. Patient has been working to increase daily energy intake. Trying to get in 2 meals + one Boost HP. Recalls eggs and meat (bacon, sausage, ham) or bowl of cereal for breakfast. Last night had one good slice of ham and large portion of collards for dinner. She is taking 2 creon with every meal. Denies signs/symptoms of EPI. Reports regular daily bowel movements. Patient drinks caffeinated coffee all day. Drinks one bottle of water as well as the Boost.   Nutrition Focused Physical Exam:   Orbital Region: severe Buccal Region: severe Upper Arm Region: Air Cabin Crew and Lumbar Region: claudell Lites Region: moderate Clavicle Bone Region: severe Shoulder and Acromion Bone Region: severe Scapular Bone Region: uta Dorsal Hand: moderate Patellar Region: uta Anterior Thigh Region: uta Posterior Calf Region: uta Edema (RD assessment): uta Hair: reviewed  Eyes: reviewed (bilateral cloudiness to iris - ?glaucoma) Mouth: reviewed  Skin: reviewed - dry Nails: reviewed    Medications: fosamax, creon, zofran , prednisone , compazine     Labs: glucose 105, BUN 6, total bilirubin 2.0 (trending down)   Anthropometrics:   Height: 5' 6.5 Weight: 98 lb  UBW: 119 lb (7/12) BMI: 15.58   Estimated Energy Needs  Kcals: 1560-1780 Protein: 62-76  Fluid: >/= 1.5  L   NUTRITION DIAGNOSIS: Inadequate oral intake related to poor appetite as evidenced by pt and family report, usual intake of one small meal   MALNUTRITION DIAGNOSIS: Pt is underweight and meets criteria for severe malnutrition related to chronic disease as evidenced by moderate/severe fat and muscle depletion, 17.6% decreased from usual weight in 5 months which is severe for time frame   INTERVENTION:  Educated on importance of adequate calorie/protein energy intake to maintain weight/strength during treatment Discussed strategies for increasing daily energy intake with small frequent meals/snacks q3h - handout with snack ideas + soft moist high protein foods list Pt agreeable to increase Boost - 2/day in between meals + add to coffee for creamer Pt will switch to decaf coffee after first cup, encourage working to increase daily water Cookbook provided Contact information    MONITORING, EVALUATION, GOAL: Patient will tolerate increased calories and protein to promote wt gain    Next Visit: Monday January 12 via telephone

## 2024-09-15 NOTE — Progress Notes (Signed)
 Patient presents today for chemotherapy Abraxane /Gemzar   infusion.  Patient is in satisfactory condition with no new complaints voiced.  Vital signs are stable.  Labs reviewed and all other labs are within treatment parameters. Patient's Bilirubin noted to be 2.0 today. Dr.Kandala made aware.  We will proceed with treatment per MD orders.    Treatment given today per MD orders. Tolerated infusion without adverse affects. Vital signs stable. No complaints at this time. Discharged from clinic ambulatory in stable condition. Alert and oriented x 3. F/U with [facility] as scheduled.

## 2024-09-15 NOTE — Progress Notes (Signed)
" °  Proceeding with Abraxane  and Gemcitabine  today.  Niels Molt, PharmD   "

## 2024-09-17 ENCOUNTER — Inpatient Hospital Stay

## 2024-09-22 ENCOUNTER — Encounter: Payer: Self-pay | Admitting: *Deleted

## 2024-09-24 ENCOUNTER — Other Ambulatory Visit: Payer: Self-pay | Admitting: Oncology

## 2024-09-24 DIAGNOSIS — C241 Malignant neoplasm of ampulla of Vater: Secondary | ICD-10-CM

## 2024-09-29 ENCOUNTER — Inpatient Hospital Stay

## 2024-09-29 ENCOUNTER — Inpatient Hospital Stay: Admitting: Oncology

## 2024-09-30 NOTE — Progress Notes (Unsigned)
 " Patient Care Team: [name], [name], NP as PCP - General Branch, Dorn FALCON, MD as PCP - Cardiology (Cardiology) Davonna Siad, MD as Medical Oncologist (Medical Oncology) Celestia Joesph SQUIBB, RN as Oncology Nurse Navigator (Medical Oncology)  Clinic Day:  10/02/2024  Referring physician: Benjamin [name], NP   CHIEF COMPLAINT:  CC: Poorly differentiated adenocarcinoma of the ampullary region    ASSESSMENT & PLAN:   Assessment & Plan: Stephanie Hendrix  is a 69 y.o. female with Periampullary adenocarcinoma  Assessment and Plan Assessment & Plan Periampullary adenocarcinoma Diagnosed by ERCP, EUS at [facility] CA 19-9: 2991 Evaluated by surgery at [facility] and recommended neoadjuvant chemotherapy Started on Gem/Abraxane  09/02/2024   - C2D1 today. Tolerated cycle 1 well with no significant side effects.Held Abraxane  for C1D1 for hyperbilirubinemia  -Labs reviewed today: CMP: Rm:wnmfjo, LFTS: normal, normal bilirubin. CBC: WBC:7.8, Hb:11.2, PLTs: 428 - Will repeat CT scan at 3 months after starting chemotherapy to assess for response.   Return to clinic prior to fourth cycle of chemotherapy to assess for tolerance.     Rash Hyper pigmented rash over abdomen and chest. Sometimes itchy. Probably related to cholestasis   - Recommended cetrizine for itch - Improved significantly    Weight loss Likely secondary to ampullary adenocarcinoma.  Patient does have an appetite now but has early satiety.  Patient is attempting to cream weight with protein shakes. Referred to nutrition for further assistance.  -Weight stable at this time   Lung nodule Patient has questionable lung nodules on screening CT scan done on 08/18/2024 here.  She did have a CT chest without contrast done on 08/17/2021 at [facility] which did not show these lesions.   - Will include a chest CT with follow-up CT scan.   Tobacco  use Patient was previously smoking 1 pack/day and has recently cut down to half a pack per day   -Recommended patient to slowly stop smoking.      The patient understands the plans discussed today and is in agreement with them.  She knows to contact our office if she develops concerns prior to her next appointment.  17 minutes of total time was spent for this patient encounter, including preparation,face-to-face counseling with the patient and coordination of care, physical exam, and documentation of the encounter.   Siad Davonna, MD  [facility] [place] - A DEPT OF [facility] [address] [place] [address] Dept: [phone] Dept Fax: [phone]   No orders of the defined types were placed in this encounter.    ONCOLOGY HISTORY:   Diagnosis: Peri ampullary adenocarcinoma   Presentation: to the ED with jaundice and elevated LFT's on 08/05/2024, admitted from 08/17/2024 to 08/20/2024 for pancreatic mass and biliary obstruction -08/16/2024: Hepatic function panel: AST 146. ALT 105. Alkaline Phosphatase 1,025. Total bilirubin 14.5. Albumin 2.9. -08/16/2024: CT AP: Severe intrahepatic and extrahepatic biliary dilatation progressed from prior with diffuse pancreatic ductal dilatation, findings most suggestive of an ampullary mass; recommend MRI or endoscopic ultrasound for further evaluation. -08/16/2024: MRCP: Severe biliary dilatation markedly progressive since 2019. Findings suspicious for a lower pancreatic head mass or ampullary lesion. It is somewhat difficult to evaluate given the distorted anatomy and massive biliary dilatation but best estimate is a 2.4 cm lesion causing obstruction of both common bile duct and the pancreatic duct. No abdominal lymphadenopathy or metastatic disease. -08/17/2024: CT Chest: NED -08/17/2024:  CA 19-9: 2,991. CA 125 Normal. CEA Normal.  -08/18/2024: EUS with ERCP and Ampullary mass :  a large ulcerated mass in the area of the expected major papilla at the ampulla. The ulcer measured at least 30 mm in size. Duodenal ampullary mass core needle biopsy: Pathology: Poorly differentiated adenocarcinoma.  -08/19/2024: Peritoneal fluid cytology: No malignant cells identified.  - Seen by surgery at [facility] during this admission and recommended neoadjuvant chemotherapy -09/02/2024: [facility] genetic testing: Negative -09/02/2024- Current: Gemcitabine  + Abraxane  every 2 weeks  - Held Abraxane  for C1D1 for hyperbilirubinemia  Current Treatment:  Neoadjuvant Gemcitabine  + Abraxane   INTERVAL HISTORY:   Stephanie Hendrix is a 74 y.o. currently on neoadj chemotherapy for periampullary carcinoma presenting for follow up. She reports tolerating first cycle very well. She has no other complaints today.   Reports appetite is good and she is gaining weight and reports very good appetite. She has no other complaints today.  I have reviewed the past medical history, past surgical history, social history and family history with the patient and they are unchanged from previous note.  ALLERGIES:  is allergic to levonorgestrel-ethinyl estrad, fish allergy, and neomycin.  MEDICATIONS:  Current Outpatient Medications  Medication Sig Dispense Refill   acetaminophen  (TYLENOL ) 500 MG tablet Take 1,000 mg by mouth every 6 (six) hours as needed for mild pain (pain score 1-3).     albuterol  (VENTOLIN  HFA) 108 (90 Base) MCG/ACT inhaler Inhale 2 puffs into the lungs every 6 (six) hours as needed for wheezing or shortness of breath. 8 g 0   alendronate (FOSAMAX) 70 MG tablet Take 70 mg by mouth once a week.     ipratropium-albuterol  (DUONEB) 0.5-2.5 (3) MG/3ML SOLN Take 3 mLs by nebulization every 6 (six) hours as needed (for shortness of breath or wheezing). 360 mL 1   lidocaine -prilocaine  (EMLA ) cream Apply to affected area once 30 g 3   lipase/protease/amylase (CREON) 12000-38000  units CPEP capsule Take by mouth.     ondansetron  (ZOFRAN ) 8 MG tablet Take 1 tablet (8 mg total) by mouth every 8 (eight) hours as needed for nausea or vomiting. 30 tablet 1   prochlorperazine  (COMPAZINE ) 10 MG tablet Take 1 tablet (10 mg total) by mouth every 6 (six) hours as needed for nausea or vomiting. 30 tablet 1   TRELEGY ELLIPTA 100-62.5-25 MCG/ACT AEPB Inhale 1 puff into the lungs daily.     predniSONE  (DELTASONE ) 10 MG tablet Take 6 tablets (60 mg total) by mouth daily with breakfast. And decrease by one tablet daily (Patient not taking: Reported on 10/01/2024) 21 tablet 0   No current facility-administered medications for this visit.     VITALS:  There were no vitals taken for this visit.  Wt Readings from Last 3 Encounters:  10/01/24 99 lb 6.4 oz (45.1 kg)  09/15/24 98 lb (44.5 kg)  09/02/24 102 lb 3.2 oz (46.4 kg)    There is no height or weight on file to calculate BMI.  Performance status (ECOG): 2 - Symptomatic, <50% confined to bed  PHYSICAL EXAM:   GENERAL:alert, no distress and comfortable NECK: supple, thyroid  normal size, non-tender, without nodularity LYMPH:  no palpable lymphadenopathy in the cervical, axillary or inguinal LUNGS: clear to auscultation and percussion with normal breathing effort HEART: regular rate & rhythm and no murmurs and no lower extremity edema ABDOMEN:abdomen soft, non-tender and normal bowel sounds Musculoskeletal:no cyanosis of digits and no clubbing  NEURO: alert & oriented x 3 with  fluent speech  LABORATORY DATA:  I have reviewed the data as listed     Component Value Date/Time   NA 140 10/01/2024 0808   NA 141 03/15/2022 1121   K 3.8 10/01/2024 0808   CL 104 10/01/2024 0808   CO2 28 10/01/2024 0808   GLUCOSE 122 (H) 10/01/2024 0808   BUN 6 (L) 10/01/2024 0808   BUN 5 (L) 03/15/2022 1121   CREATININE 0.52 10/01/2024 0808   CALCIUM 9.0 10/01/2024 0808   PROT 6.9 10/01/2024 0808   PROT 6.7 03/15/2022 1121   ALBUMIN 3.7  10/01/2024 0808   ALBUMIN 4.0 03/15/2022 1121   AST 22 10/01/2024 0808   ALT 15 10/01/2024 0808   ALKPHOS 119 10/01/2024 0808   BILITOT 1.0 10/01/2024 0808   BILITOT <0.2 03/15/2022 1121   GFRNONAA >60 10/01/2024 0808   GFRAA >60 08/24/2018 1620     Lab Results  Component Value Date   WBC 7.8 10/01/2024   NEUTROABS 4.4 10/01/2024   HGB 11.2 (L) 10/01/2024   HCT 35.0 (L) 10/01/2024   MCV 78.7 (L) 10/01/2024   PLT 428 (H) 10/01/2024      Chemistry      Component Value Date/Time   NA 140 10/01/2024 0808   NA 141 03/15/2022 1121   K 3.8 10/01/2024 0808   CL 104 10/01/2024 0808   CO2 28 10/01/2024 0808   BUN 6 (L) 10/01/2024 0808   BUN 5 (L) 03/15/2022 1121   CREATININE 0.52 10/01/2024 0808      Component Value Date/Time   CALCIUM 9.0 10/01/2024 0808   ALKPHOS 119 10/01/2024 0808   AST 22 10/01/2024 0808   ALT 15 10/01/2024 0808   BILITOT 1.0 10/01/2024 0808   BILITOT <0.2 03/15/2022 1121        RADIOGRAPHIC STUDIES: I have personally reviewed the radiological images as listed and agreed with the findings in the report.  CT CHEST LUNG CA SCREEN LOW DOSE W/O CM CLINICAL DATA:  138 pack-year smoking history.  Current smoker.  EXAM: CT CHEST WITHOUT CONTRAST LOW-DOSE FOR LUNG CANCER SCREENING  TECHNIQUE: Multidetector CT imaging of the chest was performed following the standard protocol without IV contrast.  RADIATION DOSE REDUCTION: This exam was performed according to the departmental dose-optimization program which includes automated exposure control, adjustment of the mA and/or kV according to patient size and/or use of iterative reconstruction technique.  COMPARISON:  11/24/2021  FINDINGS: Cardiovascular: Aortic atherosclerosis. Normal heart size, without pericardial effusion. Lad and right coronary artery calcification.  Mediastinum/Nodes: No mediastinal or hilar adenopathy, given limitations of unenhanced CT.  Lungs/Pleura: No pleural fluid.  Moderate centrilobular emphysema. Development of bilateral upper lobe irregular densities. Example right upper lobe at 6.5 mm on image 115. Left upper lobe at 6.4 mm on image 90.  Upper Abdomen: Development of moderate to marked intrahepatic biliary duct dilatation, as detailed on subsequent abdominal CT of 08/16/2024. Normal imaged portions of the spleen, stomach.  Musculoskeletal: Mild convex right upper thoracic spine curvature. Mild osteopenia.  IMPRESSION: Lung-RADS 4A, suspicious. Follow up low-dose chest CT without contrast in 3 months (please use the following order, CT CHEST LCS NODULE FOLLOW-UP W/O CM) is recommended. Alternatively, PET may be considered when there is a solid component 8mm or larger. Bilateral upper lobe irregular nodular densities are new at maximally 6.5 mm.  Intrahepatic biliary duct dilatation, as evaluated on subsequent CT and MRCP [id number].  Aortic atherosclerosis (ICD10-I70.0), coronary artery atherosclerosis and emphysema (ICD10-J43.9).  These results will be  called to the ordering clinician or representative by the Radiologist Assistant, and communication documented in the PACS or [employer].  Electronically Signed   By: Rockey Kilts M.D.   On: 08/18/2024 15:52    "

## 2024-10-01 ENCOUNTER — Inpatient Hospital Stay

## 2024-10-01 ENCOUNTER — Inpatient Hospital Stay: Attending: Oncology

## 2024-10-01 ENCOUNTER — Inpatient Hospital Stay: Attending: Oncology | Admitting: Oncology

## 2024-10-01 VITALS — BP 99/70 | HR 55 | Temp 97.0°F | Resp 18

## 2024-10-01 DIAGNOSIS — Z7983 Long term (current) use of bisphosphonates: Secondary | ICD-10-CM | POA: Diagnosis not present

## 2024-10-01 DIAGNOSIS — Z5111 Encounter for antineoplastic chemotherapy: Secondary | ICD-10-CM | POA: Insufficient documentation

## 2024-10-01 DIAGNOSIS — R911 Solitary pulmonary nodule: Secondary | ICD-10-CM | POA: Insufficient documentation

## 2024-10-01 DIAGNOSIS — R21 Rash and other nonspecific skin eruption: Secondary | ICD-10-CM

## 2024-10-01 DIAGNOSIS — Z720 Tobacco use: Secondary | ICD-10-CM

## 2024-10-01 DIAGNOSIS — Z7952 Long term (current) use of systemic steroids: Secondary | ICD-10-CM | POA: Insufficient documentation

## 2024-10-01 DIAGNOSIS — C241 Malignant neoplasm of ampulla of Vater: Secondary | ICD-10-CM

## 2024-10-01 DIAGNOSIS — R634 Abnormal weight loss: Secondary | ICD-10-CM | POA: Diagnosis not present

## 2024-10-01 DIAGNOSIS — F17210 Nicotine dependence, cigarettes, uncomplicated: Secondary | ICD-10-CM | POA: Diagnosis not present

## 2024-10-01 LAB — CBC WITH DIFFERENTIAL/PLATELET
Abs Immature Granulocytes: 0.04 K/uL (ref 0.00–0.07)
Basophils Absolute: 0 K/uL (ref 0.0–0.1)
Basophils Relative: 0 %
Eosinophils Absolute: 0.2 K/uL (ref 0.0–0.5)
Eosinophils Relative: 3 %
HCT: 35 % — ABNORMAL LOW (ref 36.0–46.0)
Hemoglobin: 11.2 g/dL — ABNORMAL LOW (ref 12.0–15.0)
Immature Granulocytes: 1 %
Lymphocytes Relative: 30 %
Lymphs Abs: 2.3 K/uL (ref 0.7–4.0)
MCH: 25.2 pg — ABNORMAL LOW (ref 26.0–34.0)
MCHC: 32 g/dL (ref 30.0–36.0)
MCV: 78.7 fL — ABNORMAL LOW (ref 80.0–100.0)
Monocytes Absolute: 0.8 K/uL (ref 0.1–1.0)
Monocytes Relative: 11 %
Neutro Abs: 4.4 K/uL (ref 1.7–7.7)
Neutrophils Relative %: 55 %
Platelets: 428 K/uL — ABNORMAL HIGH (ref 150–400)
RBC: 4.45 MIL/uL (ref 3.87–5.11)
RDW: 15.5 % (ref 11.5–15.5)
WBC: 7.8 K/uL (ref 4.0–10.5)
nRBC: 0 % (ref 0.0–0.2)

## 2024-10-01 LAB — COMPREHENSIVE METABOLIC PANEL WITH GFR
ALT: 15 U/L (ref 0–44)
AST: 22 U/L (ref 15–41)
Albumin: 3.7 g/dL (ref 3.5–5.0)
Alkaline Phosphatase: 119 U/L (ref 38–126)
Anion gap: 9 (ref 5–15)
BUN: 6 mg/dL — ABNORMAL LOW (ref 8–23)
CO2: 28 mmol/L (ref 22–32)
Calcium: 9 mg/dL (ref 8.9–10.3)
Chloride: 104 mmol/L (ref 98–111)
Creatinine, Ser: 0.52 mg/dL (ref 0.44–1.00)
GFR, Estimated: >60 mL/min
Glucose, Bld: 122 mg/dL — ABNORMAL HIGH (ref 70–99)
Potassium: 3.8 mmol/L (ref 3.5–5.1)
Sodium: 140 mmol/L (ref 135–145)
Total Bilirubin: 1 mg/dL (ref 0.0–1.2)
Total Protein: 6.9 g/dL (ref 6.5–8.1)

## 2024-10-01 LAB — MAGNESIUM: Magnesium: 2.2 mg/dL (ref 1.7–2.4)

## 2024-10-01 MED ORDER — SODIUM CHLORIDE 0.9 % IV SOLN: INTRAVENOUS | Status: DC

## 2024-10-01 MED ORDER — SODIUM CHLORIDE 0.9 % IV SOLN
1000.0000 mg/m2 | Freq: Once | INTRAVENOUS | Status: AC
  Administered 2024-10-01: 1482 mg via INTRAVENOUS
  Filled 2024-10-01: qty 38.98

## 2024-10-01 MED ORDER — PROCHLORPERAZINE MALEATE 10 MG PO TABS
10.0000 mg | ORAL_TABLET | Freq: Once | ORAL | Status: AC
  Administered 2024-10-01: 10 mg via ORAL
  Filled 2024-10-01: qty 1

## 2024-10-01 MED ORDER — PACLITAXEL PROTEIN-BOUND CHEMO INJECTION 100 MG
125.0000 mg/m2 | Freq: Once | INTRAVENOUS | Status: AC
  Administered 2024-10-01: 200 mg via INTRAVENOUS
  Filled 2024-10-01: qty 40

## 2024-10-01 NOTE — Patient Instructions (Signed)
 [facility] [place] - A DEPT OF [facility]  Discharge Instructions: Thank you for choosing [facility] to provide your oncology and hematology care.  If you have a lab appointment with the Cancer Center - please note that after April 8th, 2024, all labs will be drawn in the cancer center.  You do not have to check in or register with the main entrance as you have in the past but will complete your check-in in the cancer center.  Wear comfortable clothing and clothing appropriate for easy access to any Portacath or PICC line.   We strive to give you quality time with your provider. You may need to reschedule your appointment if you arrive late (15 or more minutes).  Arriving late affects you and other patients whose appointments are after yours.  Also, if you miss three or more appointments without notifying the office, you may be dismissed from the clinic at the provider's discretion.      For prescription refill requests, have your pharmacy contact our office and allow 72 hours for refills to be completed.    Today you received the following chemotherapy and/or immunotherapy agents Abraxane/Gemzar      To help prevent nausea and vomiting after your treatment, we encourage you to take your nausea medication as directed.  BELOW ARE SYMPTOMS THAT SHOULD BE REPORTED IMMEDIATELY: *FEVER GREATER THAN 100.4 F (38 C) OR HIGHER *CHILLS OR SWEATING *NAUSEA AND VOMITING THAT IS NOT CONTROLLED WITH YOUR NAUSEA MEDICATION *UNUSUAL SHORTNESS OF BREATH *UNUSUAL BRUISING OR BLEEDING *URINARY PROBLEMS (pain or burning when urinating, or frequent urination) *BOWEL PROBLEMS (unusual diarrhea, constipation, pain near the anus) TENDERNESS IN MOUTH AND THROAT WITH OR WITHOUT PRESENCE OF ULCERS (sore throat, sores in mouth, or a toothache) UNUSUAL RASH, SWELLING OR PAIN  UNUSUAL VAGINAL DISCHARGE OR ITCHING   Items with * indicate a potential emergency and should be  followed up as soon as possible or go to the Emergency Department if any problems should occur.  Please show the CHEMOTHERAPY ALERT CARD or IMMUNOTHERAPY ALERT CARD at check-in to the Emergency Department and triage nurse.  Should you have questions after your visit or need to cancel or reschedule your appointment, please contact [facility]CER [facility] [place] - A DEPT OF [facility] [phone]  and follow the prompts.  Office hours are 8:00 a.m. to 4:30 p.m. Monday - Friday. Please note that voicemails left after 4:00 p.m. may not be returned until the following business day.  We are closed weekends and major holidays. You have access to a nurse at all times for urgent questions. Please call the main number to the clinic [phone] and follow the prompts.  For any non-urgent questions, you may also contact your provider using MyChart. We now offer e-Visits for anyone 30 and older to request care online for non-urgent symptoms. For details visit [email or web address].   Also download the MyChart app! Go to the app store, search "MyChart", open the app, select [place], and log in with your MyChart username and password.

## 2024-10-01 NOTE — Progress Notes (Signed)
 Patient presents today for chemotherapy Abraxane /Gemzar  infusion. Patient is in satisfactory condition with no new complaints voiced.  Vital signs are stable.  Labs reviewed by Dr. Davonna during the office visit and all labs are within treatment parameters.  We will proceed with treatment per MD orders.

## 2024-10-01 NOTE — Progress Notes (Signed)
Treatment given today per MD orders.  Tolerated infusion without adverse affects.  Vital signs stable.  No complaints at this time.  Discharge from clinic ambulatory in stable condition.  Alert and oriented X 3.  Follow up with [facility] as scheduled.

## 2024-10-01 NOTE — Progress Notes (Signed)
 Patient has been examined by Dr. Davonna. Vital signs and labs have been reviewed by MD - ANC, Creatinine, LFTs, hemoglobin, and platelets have been reviewed by M.D. - pt may proceed with treatment.  Primary RN and pharmacy notified.

## 2024-10-01 NOTE — Patient Instructions (Signed)

## 2024-10-04 ENCOUNTER — Encounter: Payer: Self-pay | Admitting: Oncology

## 2024-10-06 ENCOUNTER — Inpatient Hospital Stay: Admitting: Dietician

## 2024-10-06 ENCOUNTER — Telehealth: Payer: Self-pay | Admitting: Dietician

## 2024-10-06 NOTE — Telephone Encounter (Signed)
 Nutrition Follow-up:  Pt with poorly differentiated adenocarcinoma of ampullary. She is receiving neoadjuvant chemotherapy with abraxane  + gemcitabine .   Spoke with patient via telephone. She is doing well today. Patient tolerating treatment other than hair loss. Appetite is good. Eating everything. Reports eating lots of fish and shrimp which she has not eaten in 40 years. This is delicious. Patient recalls 3 good meals. She has eaten a peanut butter sandwich today. Planning to heat up leftover fish for lunch. Patient denies nutrition impact symptoms at this time.   Medications: reviewed   Labs: 1/7 - glucose 122, BUN 6  Anthropometrics: Last wt 99 lb 6.4 oz on 1/7  12/22 - 98 lb  11/22 - 108 lb    Estimated Energy Needs  Kcals: 1560-1780 Protein: 62-76 Fluid: >/= 1.5 L  NUTRITION DIAGNOSIS: Inadequate oral intake improving    MALNUTRITION DIAGNOSIS: Severe malnutrition continues    INTERVENTION:  Encourage high calorie high protein foods Continue taking creon as prescribed Continue Boost - encourage 2/day in between meals    MONITORING, EVALUATION, GOAL: wt trends, intake   NEXT VISIT: Monday February 23 via telephone

## 2024-10-15 ENCOUNTER — Inpatient Hospital Stay

## 2024-10-15 DIAGNOSIS — C241 Malignant neoplasm of ampulla of Vater: Secondary | ICD-10-CM

## 2024-10-15 DIAGNOSIS — Z5111 Encounter for antineoplastic chemotherapy: Secondary | ICD-10-CM | POA: Diagnosis not present

## 2024-10-15 LAB — COMPREHENSIVE METABOLIC PANEL WITH GFR
ALT: 13 U/L (ref 0–44)
AST: 21 U/L (ref 15–41)
Albumin: 3.7 g/dL (ref 3.5–5.0)
Alkaline Phosphatase: 118 U/L (ref 38–126)
Anion gap: 12 (ref 5–15)
BUN: <5 mg/dL — ABNORMAL LOW (ref 8–23)
CO2: 26 mmol/L (ref 22–32)
Calcium: 9.5 mg/dL (ref 8.9–10.3)
Chloride: 102 mmol/L (ref 98–111)
Creatinine, Ser: 0.53 mg/dL (ref 0.44–1.00)
GFR, Estimated: >60 mL/min
Glucose, Bld: 122 mg/dL — ABNORMAL HIGH (ref 70–99)
Potassium: 4 mmol/L (ref 3.5–5.1)
Sodium: 139 mmol/L (ref 135–145)
Total Bilirubin: 0.7 mg/dL (ref 0.0–1.2)
Total Protein: 7.1 g/dL (ref 6.5–8.1)

## 2024-10-15 LAB — CBC WITH DIFFERENTIAL/PLATELET
Abs Immature Granulocytes: 0.02 K/uL (ref 0.00–0.07)
Basophils Absolute: 0 K/uL (ref 0.0–0.1)
Basophils Relative: 0 %
Eosinophils Absolute: 0.5 K/uL (ref 0.0–0.5)
Eosinophils Relative: 7 %
HCT: 37.1 % (ref 36.0–46.0)
Hemoglobin: 11.8 g/dL — ABNORMAL LOW (ref 12.0–15.0)
Immature Granulocytes: 0 %
Lymphocytes Relative: 40 %
Lymphs Abs: 2.7 K/uL (ref 0.7–4.0)
MCH: 24.7 pg — ABNORMAL LOW (ref 26.0–34.0)
MCHC: 31.8 g/dL (ref 30.0–36.0)
MCV: 77.6 fL — ABNORMAL LOW (ref 80.0–100.0)
Monocytes Absolute: 0.5 K/uL (ref 0.1–1.0)
Monocytes Relative: 8 %
Neutro Abs: 3 K/uL (ref 1.7–7.7)
Neutrophils Relative %: 45 %
Platelets: 363 K/uL (ref 150–400)
RBC: 4.78 MIL/uL (ref 3.87–5.11)
RDW: 16.1 % — ABNORMAL HIGH (ref 11.5–15.5)
WBC: 6.7 K/uL (ref 4.0–10.5)
nRBC: 0 % (ref 0.0–0.2)

## 2024-10-15 LAB — MAGNESIUM: Magnesium: 2 mg/dL (ref 1.7–2.4)

## 2024-10-15 MED ORDER — SODIUM CHLORIDE 0.9 % IV SOLN
1000.0000 mg/m2 | Freq: Once | INTRAVENOUS | Status: AC
  Administered 2024-10-15: 1482 mg via INTRAVENOUS
  Filled 2024-10-15: qty 38.98

## 2024-10-15 MED ORDER — PROCHLORPERAZINE MALEATE 10 MG PO TABS
10.0000 mg | ORAL_TABLET | Freq: Once | ORAL | Status: AC
  Administered 2024-10-15: 10 mg via ORAL
  Filled 2024-10-15: qty 1

## 2024-10-15 MED ORDER — PACLITAXEL PROTEIN-BOUND CHEMO INJECTION 100 MG
125.0000 mg/m2 | Freq: Once | INTRAVENOUS | Status: AC
  Administered 2024-10-15: 200 mg via INTRAVENOUS
  Filled 2024-10-15: qty 40

## 2024-10-15 MED ORDER — SODIUM CHLORIDE 0.9 % IV SOLN: INTRAVENOUS | Status: DC

## 2024-10-15 NOTE — Patient Instructions (Signed)
 [facility] - A DEPT OF [facility]  Discharge Instructions: Thank you for choosing [facility] to provide your oncology and hematology care.  If you have a lab appointment with the Cancer Center - please note that after April 8th, 2024, all labs will be drawn in the cancer center.  You do not have to check in or register with the main entrance as you have in the past but will complete your check-in in the cancer center.  Wear comfortable clothing and clothing appropriate for easy access to any Portacath or PICC line.   We strive to give you quality time with your provider. You may need to reschedule your appointment if you arrive late (15 or more minutes).  Arriving late affects you and other patients whose appointments are after yours.  Also, if you miss three or more appointments without notifying the office, you may be dismissed from the clinic at the providers discretion.      For prescription refill requests, have your pharmacy contact our office and allow 72 hours for refills to be completed.    Today you received the following chemotherapy and/or immunotherapy agents Abraxane /Gemzar       To help prevent nausea and vomiting after your treatment, we encourage you to take your nausea medication as directed.   Paclitaxel  Nanoparticle Albumin-Bound Injection What is this medication? NANOPARTICLE ALBUMIN-BOUND PACLITAXEL  (Na no PAHR ti kuhl al BYOO muhn-bound PAK li TAX el) treats some types of cancer. It works by slowing down the growth of cancer cells. This medicine may be used for other purposes; ask your health care provider or pharmacist if you have questions. COMMON BRAND NAME(S): Abraxane  What should I tell my care team before I take this medication? They need to know if you have any of these conditions: Liver disease Low white blood cell levels An unusual or allergic reaction to paclitaxel , albumin, other medications, foods, dyes, or  preservatives If you or your partner are pregnant or trying to get pregnant Breast-feeding How should I use this medication? This medication is injected into a vein. It is given by your care team in a hospital or clinic setting. Talk to your care team about the use of this medication in children. Special care may be needed. Overdosage: If you think you have taken too much of this medicine contact a poison control center or emergency room at once. NOTE: This medicine is only for you. Do not share this medicine with others. What if I miss a dose? Keep appointments for follow-up doses. It is important not to miss your dose. Call your care team if you are unable to keep an appointment. What may interact with this medication? Other medications may affect the way this medication works. Talk with your care team about all of the medications you take. They may suggest changes to your treatment plan to lower the risk of side effects and to make sure your medications work as intended. This list may not describe all possible interactions. Give your health care provider a list of all the medicines, herbs, non-prescription drugs, or dietary supplements you use. Also tell them if you smoke, drink alcohol , or use illegal drugs. Some items may interact with your medicine. What should I watch for while using this medication? Your condition will be monitored carefully while you are receiving this medication. You may need blood work while taking this medication. This medication may make you feel generally unwell. This is not  uncommon as chemotherapy can affect healthy cells as well as cancer cells. Report any side effects. Continue your course of treatment even though you feel ill unless your care team tells you to stop. This medication can cause serious allergic reactions. To reduce the risk, your care team may give you other medications to take before receiving this one. Be sure to follow the directions from your care  team. This medication may increase your risk of getting an infection. Call your care team for advice if you get a fever, chills, sore throat, or other symptoms of a cold or flu. Do not treat yourself. Try to avoid being around people who are sick. This medication may increase your risk to bruise or bleed. Call your care team if you notice any unusual bleeding. Be careful brushing or flossing your teeth or using a toothpick because you may get an infection or bleed more easily. If you have any dental work done, tell your dentist you are receiving this medication. Talk to your care team if you or your partner may be pregnant. Serious birth defects can occur if you take this medication during pregnancy and for 6 months after the last dose. You will need a negative pregnancy test before starting this medication. Contraception is recommended while taking this medication and for 6 months after the last dose. Your care team can help you find the option that works for you. If your partner can get pregnant, use a condom during sex while taking this medication and for 3 months after the last dose. Do not breastfeed while taking this medication and for 2 weeks after the last dose. This medication may cause infertility. Talk to your care team if you are concerned about your fertility. What side effects may I notice from receiving this medication? Side effects that you should report to your care team as soon as possible: Allergic reactions--skin rash, itching, hives, swelling of the face, lips, tongue, or throat Dry cough, shortness of breath or trouble breathing Infection--fever, chills, cough, sore throat, wounds that don't heal, pain or trouble when passing urine, general feeling of discomfort or being unwell Low red blood cell level--unusual weakness or fatigue, dizziness, headache, trouble breathing Pain, tingling, or numbness in the hands or feet Stomach pain, unusual weakness or fatigue, nausea, vomiting,  diarrhea, or fever that lasts longer than expected Unusual bruising or bleeding Side effects that usually do not require medical attention (report to your care team if they continue or are bothersome): Diarrhea Fatigue Hair loss Loss of appetite Nausea Vomiting This list may not describe all possible side effects. Call your doctor for medical advice about side effects. You may report side effects to FDA at 1-800-FDA-1088. Where should I keep my medication? This medication is given in a hospital or clinic. It will not be stored at home. NOTE: This sheet is a summary. It may not cover all possible information. If you have questions about this medicine, talk to your doctor, pharmacist, or health care provider.  2024 Elsevier/Gold Standard (2022-01-26 00:00:00)  BELOW ARE SYMPTOMS THAT SHOULD BE REPORTED IMMEDIATELY: *FEVER GREATER THAN 100.4 F (38 C) OR HIGHER *CHILLS OR SWEATING *NAUSEA AND VOMITING THAT IS NOT CONTROLLED WITH YOUR NAUSEA MEDICATION *UNUSUAL SHORTNESS OF BREATH *UNUSUAL BRUISING OR BLEEDING *URINARY PROBLEMS (pain or burning when urinating, or frequent urination) *BOWEL PROBLEMS (unusual diarrhea, constipation, pain near the anus) TENDERNESS IN MOUTH AND THROAT WITH OR WITHOUT PRESENCE OF ULCERS (sore throat, sores in mouth, or a toothache) UNUSUAL RASH,  SWELLING OR PAIN  UNUSUAL VAGINAL DISCHARGE OR ITCHING   Gemcitabine  Injection What is this medication? GEMCITABINE  (jem SYE ta been) treats some types of cancer. It works by slowing down the growth of cancer cells. This medicine may be used for other purposes; ask your health care provider or pharmacist if you have questions. COMMON BRAND NAME(S): Gemzar , Infugem  What should I tell my care team before I take this medication? They need to know if you have any of these conditions: Blood disorders Infection Kidney disease Liver disease Lung or breathing disease, such as asthma or COPD Recent or ongoing radiation  therapy An unusual or allergic reaction to gemcitabine , other medications, foods, dyes, or preservatives If you or your partner are pregnant or trying to get pregnant Breast-feeding How should I use this medication? This medication is injected into a vein. It is given by your care team in a hospital or clinic setting. Talk to your care team about the use of this medication in children. Special care may be needed. Overdosage: If you think you have taken too much of this medicine contact a poison control center or emergency room at once. NOTE: This medicine is only for you. Do not share this medicine with others. What if I miss a dose? Keep appointments for follow-up doses. It is important not to miss your dose. Call your care team if you are unable to keep an appointment. What may interact with this medication? Interactions have not been studied. This list may not describe all possible interactions. Give your health care provider a list of all the medicines, herbs, non-prescription drugs, or dietary supplements you use. Also tell them if you smoke, drink alcohol , or use illegal drugs. Some items may interact with your medicine. What should I watch for while using this medication? Your condition will be monitored carefully while you are receiving this medication. This medication may make you feel generally unwell. This is not uncommon, as chemotherapy can affect healthy cells as well as cancer cells. Report any side effects. Continue your course of treatment even though you feel ill unless your care team tells you to stop. In some cases, you may be given additional medications to help with side effects. Follow all directions for their use. This medication may increase your risk of getting an infection. Call your care team for advice if you get a fever, chills, sore throat, or other symptoms of a cold or flu. Do not treat yourself. Try to avoid being around people who are sick. This medication may  increase your risk to bruise or bleed. Call your care team if you notice any unusual bleeding. Be careful brushing or flossing your teeth or using a toothpick because you may get an infection or bleed more easily. If you have any dental work done, tell your dentist you are receiving this medication. Avoid taking medications that contain aspirin, acetaminophen , ibuprofen, naproxen , or ketoprofen unless instructed by your care team. These medications may hide a fever. Talk to your care team if you or your partner wish to become pregnant or think you might be pregnant. This medication can cause serious birth defects if taken during pregnancy and for 6 months after the last dose. A negative pregnancy test is required before starting this medication. A reliable form of contraception is recommended while taking this medication and for 6 months after the last dose. Talk to your care team about effective forms of contraception. Do not father a child while taking this medication and for  3 months after the last dose. Use a condom while having sex during this time period. Do not breastfeed while taking this medication and for at least 1 week after the last dose. This medication may cause infertility. Talk to your care team if you are concerned about your fertility. What side effects may I notice from receiving this medication? Side effects that you should report to your care team as soon as possible: Allergic reactions--skin rash, itching, hives, swelling of the face, lips, tongue, or throat Capillary leak syndrome--stomach or muscle pain, unusual weakness or fatigue, feeling faint or lightheaded, decrease in the amount of urine, swelling of the ankles, hands, or feet, trouble breathing Infection--fever, chills, cough, sore throat, wounds that don't heal, pain or trouble when passing urine, general feeling of discomfort or being unwell Liver injury--right upper belly pain, loss of appetite, nausea, light-colored  stool, dark yellow or brown urine, yellowing skin or eyes, unusual weakness or fatigue Low red blood cell level--unusual weakness or fatigue, dizziness, headache, trouble breathing Lung injury--shortness of breath or trouble breathing, cough, spitting up blood, chest pain, fever Stomach pain, bloody diarrhea, pale skin, unusual weakness or fatigue, decrease in the amount of urine, which may be signs of hemolytic uremic syndrome Sudden and severe headache, confusion, change in vision, seizures, which may be signs of posterior reversible encephalopathy syndrome (PRES) Unusual bruising or bleeding Side effects that usually do not require medical attention (report to your care team if they continue or are bothersome): Diarrhea Drowsiness Hair loss Nausea Pain, redness, or swelling with sores inside the mouth or throat Vomiting This list may not describe all possible side effects. Call your doctor for medical advice about side effects. You may report side effects to FDA at 1-800-FDA-1088. Where should I keep my medication? This medication is given in a hospital or clinic. It will not be stored at home. NOTE: This sheet is a summary. It may not cover all possible information. If you have questions about this medicine, talk to your doctor, pharmacist, or health care provider.  2024 Elsevier/Gold Standard (2022-01-17 00:00:00)  Items with * indicate a potential emergency and should be followed up as soon as possible or go to the Emergency Department if any problems should occur.  Please show the CHEMOTHERAPY ALERT CARD or IMMUNOTHERAPY ALERT CARD at check-in to the Emergency Department and triage nurse.  Should you have questions after your visit or need to cancel or reschedule your appointment, please contact [facility] [place] - A DEPT OF [facility] [phone]  and follow the prompts.  Office hours are 8:00 a.m. to 4:30 p.m. Monday - Friday. Please note that voicemails  left after 4:00 p.m. may not be returned until the following business day.  We are closed weekends and major holidays. You have access to a nurse at all times for urgent questions. Please call the main number to the clinic [phone] and follow the prompts.  For any non-urgent questions, you may also contact your provider using MyChart. We now offer e-Visits for anyone 65 and older to request care online for non-urgent symptoms. For details visit [email or web address].   Also download the MyChart app! Go to the app store, search MyChart, open the app, select [place], and log in with your MyChart username and password.

## 2024-10-15 NOTE — Progress Notes (Signed)
 Patient presents today for chemotherapy Abraxane /Gemzar  infusion.  Patient is in satisfactory condition with no new complaints voiced.  Vital signs are stable.  Labs reviewed and all labs are within treatment parameters.  We will proceed with treatment per MD orders.    Treatment given today per MD orders. Tolerated infusion without adverse affects. Vital signs stable. No complaints at this time. Discharged from clinic ambulatory in stable condition. Alert and oriented x 3. F/U with [facility] as scheduled.

## 2024-10-29 ENCOUNTER — Inpatient Hospital Stay

## 2024-10-29 ENCOUNTER — Inpatient Hospital Stay: Attending: Oncology | Admitting: Oncology

## 2024-10-29 VITALS — BP 108/78 | HR 59 | Temp 97.0°F | Resp 18

## 2024-10-29 VITALS — BP 93/69 | HR 72 | Temp 98.1°F | Resp 18 | Ht 66.5 in | Wt 95.4 lb

## 2024-10-29 DIAGNOSIS — C241 Malignant neoplasm of ampulla of Vater: Secondary | ICD-10-CM | POA: Diagnosis not present

## 2024-10-29 DIAGNOSIS — R21 Rash and other nonspecific skin eruption: Secondary | ICD-10-CM | POA: Diagnosis not present

## 2024-10-29 DIAGNOSIS — R911 Solitary pulmonary nodule: Secondary | ICD-10-CM

## 2024-10-29 DIAGNOSIS — R634 Abnormal weight loss: Secondary | ICD-10-CM

## 2024-10-29 DIAGNOSIS — Z720 Tobacco use: Secondary | ICD-10-CM

## 2024-10-29 LAB — CBC WITH DIFFERENTIAL/PLATELET
Abs Immature Granulocytes: 0.04 10*3/uL (ref 0.00–0.07)
Basophils Absolute: 0 10*3/uL (ref 0.0–0.1)
Basophils Relative: 0 %
Eosinophils Absolute: 0.6 10*3/uL — ABNORMAL HIGH (ref 0.0–0.5)
Eosinophils Relative: 7 %
HCT: 37.1 % (ref 36.0–46.0)
Hemoglobin: 12 g/dL (ref 12.0–15.0)
Immature Granulocytes: 1 %
Lymphocytes Relative: 38 %
Lymphs Abs: 3.3 10*3/uL (ref 0.7–4.0)
MCH: 24.9 pg — ABNORMAL LOW (ref 26.0–34.0)
MCHC: 32.3 g/dL (ref 30.0–36.0)
MCV: 77.1 fL — ABNORMAL LOW (ref 80.0–100.0)
Monocytes Absolute: 0.9 10*3/uL (ref 0.1–1.0)
Monocytes Relative: 10 %
Neutro Abs: 3.9 10*3/uL (ref 1.7–7.7)
Neutrophils Relative %: 44 %
Platelets: 379 10*3/uL (ref 150–400)
RBC: 4.81 MIL/uL (ref 3.87–5.11)
RDW: 17 % — ABNORMAL HIGH (ref 11.5–15.5)
WBC: 8.7 10*3/uL (ref 4.0–10.5)
nRBC: 0 % (ref 0.0–0.2)

## 2024-10-29 LAB — COMPREHENSIVE METABOLIC PANEL WITH GFR
ALT: 9 U/L (ref 0–44)
AST: 19 U/L (ref 15–41)
Albumin: 3.4 g/dL — ABNORMAL LOW (ref 3.5–5.0)
Alkaline Phosphatase: 104 U/L (ref 38–126)
Anion gap: 15 (ref 5–15)
BUN: 6 mg/dL — ABNORMAL LOW (ref 8–23)
CO2: 22 mmol/L (ref 22–32)
Calcium: 9.4 mg/dL (ref 8.9–10.3)
Chloride: 105 mmol/L (ref 98–111)
Creatinine, Ser: 0.52 mg/dL (ref 0.44–1.00)
GFR, Estimated: >60 mL/min
Glucose, Bld: 110 mg/dL — ABNORMAL HIGH (ref 70–99)
Potassium: 3.9 mmol/L (ref 3.5–5.1)
Sodium: 141 mmol/L (ref 135–145)
Total Bilirubin: 0.5 mg/dL (ref 0.0–1.2)
Total Protein: 7.3 g/dL (ref 6.5–8.1)

## 2024-10-29 LAB — MAGNESIUM: Magnesium: 2 mg/dL (ref 1.7–2.4)

## 2024-10-29 MED ORDER — PACLITAXEL PROTEIN-BOUND CHEMO INJECTION 100 MG
125.0000 mg/m2 | Freq: Once | INTRAVENOUS | Status: AC
  Administered 2024-10-29: 200 mg via INTRAVENOUS
  Filled 2024-10-29: qty 40

## 2024-10-29 MED ORDER — SODIUM CHLORIDE 0.9 % IV SOLN
1000.0000 mg/m2 | Freq: Once | INTRAVENOUS | Status: AC
  Administered 2024-10-29: 1482 mg via INTRAVENOUS
  Filled 2024-10-29: qty 38.98

## 2024-10-29 MED ORDER — SODIUM CHLORIDE 0.9 % IV SOLN: INTRAVENOUS | Status: DC

## 2024-10-29 MED ORDER — PROCHLORPERAZINE MALEATE 10 MG PO TABS
10.0000 mg | ORAL_TABLET | Freq: Once | ORAL | Status: AC
  Administered 2024-10-29: 10 mg via ORAL
  Filled 2024-10-29: qty 1

## 2024-10-29 NOTE — Progress Notes (Signed)
 Patient presents today for treatment and follow up visit with Dr. Davonna. Blood pressure low on arrival. 89/60. Asymptomatic. Lab work within parameters for treatment.   Message received from A.Lenon RN / Dr. Davonna to proceed with treatment.   Treatment given today per MD orders. Tolerated infusion without adverse affects. Vital signs stable. No complaints at this time. Discharged from clinic ambulatory in stable condition. Alert and oriented x 3. F/U with [facility] as scheduled.

## 2024-10-29 NOTE — Progress Notes (Unsigned)
 Patient has been examined by Dr. Davonna. Vital signs and labs have been reviewed by MD - ANC, Creatinine, LFTs, hemoglobin, and platelets have been reviewed by M.D. - pt may proceed with treatment.  Primary RN and pharmacy notified.

## 2024-10-29 NOTE — Progress Notes (Unsigned)
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 CA 19-9: 2991 Evaluated by surgery at [facility] and recommended neoadjuvant chemotherapy Started on Gem/Abraxane  09/02/2024   - C3D1 today. Tolerated cycle 1 well with no significant side effects.Held Abraxane  for C1D1 for hyperbilirubinemia  -Labs reviewed today: CMP: Rm:wnmfjo, LFTS: normal, normal bilirubin. CBC: WBC:7.8, Hb:11.2, PLTs: 428 - Will repeat CT scan at 3 months after starting chemotherapy to assess for response. -Follow up with Surgery 11/07/2024 along with a scan   Return to clinic prior to next cycle of chemotherapy to assess for tolerance.     Rash Hyper pigmented rash over abdomen and chest. Sometimes itchy. Probably related to cholestasis Resolved at this time   Weight loss Likely secondary to ampullary adenocarcinoma.  Patient does have an appetite now but has early satiety.  Patient is attempting to cream weight with protein shakes. Referred to nutrition for further assistance.  -Weight stable at this time   Lung nodule Patient has questionable lung nodules on screening CT scan done on 08/18/2024 here.  She did have a CT chest without contrast done on 08/17/2021 at [facility] which did not show these lesions.   - Will include a chest CT with follow-up CT scan.    Tobacco use Patient was previously smoking 1 pack/day and has recently cut down to half a pack per day   -Recommended patient to slowly stop smoking.    The patient understands the plans discussed today and is in agreement with them.  She knows to contact our office if she develops concerns prior to her next appointment.  17 minutes of total time was spent for this patient encounter, including preparation,face-to-face counseling with the patient and coordination of care, physical exam, and documentation of the encounter.   Siad Davonna, MD  [facility] - A DEPT OF [name] Dept: [phone] Dept Fax: [phone]   No orders of the defined types were placed in this encounter.    ONCOLOGY HISTORY:   Diagnosis: Peri ampullary adenocarcinoma   Presentation: to the ED with jaundice and elevated LFT's on 08/05/2024, admitted from 08/17/2024 to 08/20/2024 for pancreatic mass and biliary obstruction -08/16/2024: Hepatic function panel: AST 146. ALT 105. Alkaline Phosphatase 1,025. Total bilirubin 14.5. Albumin 2.9. -08/16/2024: CT AP: Severe intrahepatic and extrahepatic biliary dilatation progressed from prior with diffuse pancreatic ductal dilatation, findings most suggestive of an ampullary mass; recommend MRI or endoscopic ultrasound for further evaluation. -08/16/2024: MRCP: Severe biliary dilatation markedly progressive since 2019. Findings suspicious for a lower pancreatic head mass or ampullary lesion. It is somewhat difficult to evaluate given the distorted anatomy and massive biliary dilatation but best estimate is a 2.4 cm lesion causing obstruction of both common bile duct and the pancreatic duct. No abdominal lymphadenopathy or metastatic disease. -08/17/2024: CT Chest: NED -08/17/2024:  CA 19-9: 2,991. CA 125 Normal. CEA Normal.  -08/18/2024: EUS with ERCP and Ampullary  mass : a large ulcerated mass in the area of the expected major papilla at the ampulla. The ulcer measured at least 30 mm in size. Duodenal ampullary mass core needle biopsy: Pathology: Poorly differentiated adenocarcinoma.  -08/19/2024: Peritoneal fluid cytology: No malignant cells identified.  - Seen by surgery at [facility] during this admission and recommended neoadjuvant chemotherapy -09/02/2024: [employer] genetic testing: Negative -09/02/2024- Current: Gemcitabine  + Abraxane  every 2 weeks  - Held Abraxane  for C1D1 for hyperbilirubinemia  Current Treatment:  Neoadjuvant Gemcitabine  + Abraxane   INTERVAL HISTORY:   Discussed the use of AI scribe software for clinical note transcription with the patient, who gave verbal consent to proceed.  History of Present Illness     I have reviewed the past medical history, past surgical history, social history and family history with the patient and they are unchanged from previous note.  ALLERGIES:  is allergic to levonorgestrel-ethinyl estrad, fish allergy, and neomycin.  MEDICATIONS:  Current Outpatient Medications  Medication Sig Dispense Refill   acetaminophen  (TYLENOL ) 500 MG tablet Take 1,000 mg by mouth every 6 (six) hours as needed for mild pain (pain score 1-3).     albuterol  (VENTOLIN  HFA) 108 (90 Base) MCG/ACT inhaler Inhale 2 puffs into the lungs every 6 (six) hours as needed for wheezing or shortness of breath. 8 g 0   alendronate (FOSAMAX) 70 MG tablet Take 70 mg by mouth once a week.     ipratropium-albuterol  (DUONEB) 0.5-2.5 (3) MG/3ML SOLN Take 3 mLs by nebulization every 6 (six) hours as needed (for shortness of breath or wheezing). 360 mL 1   lidocaine -prilocaine  (EMLA ) cream Apply to affected area once 30 g 3   lipase/protease/amylase (CREON) 12000-38000 units CPEP capsule Take by mouth.     ondansetron  (ZOFRAN ) 8 MG tablet Take 1 tablet (8 mg total) by mouth every 8 (eight) hours as needed for nausea  or vomiting. 30 tablet 1   predniSONE  (DELTASONE ) 10 MG tablet Take 6 tablets (60 mg total) by mouth daily with breakfast. And decrease by one tablet daily 21 tablet 0   prochlorperazine  (COMPAZINE ) 10 MG tablet Take 1 tablet (10 mg total) by mouth every 6 (six) hours as needed for nausea or vomiting. 30 tablet 1   TRELEGY ELLIPTA 100-62.5-25 MCG/ACT AEPB Inhale 1 puff into the lungs daily.     No current facility-administered medications for this visit.     VITALS:  There were no vitals taken for this visit.  Wt Readings from Last 3 Encounters:  10/15/24 97 lb 12.8 oz (44.4 kg)  10/01/24 99 lb 6.4 oz (45.1 kg)  09/15/24 98 lb (44.5 kg)    There is no height or weight on file to calculate BMI.  Performance status (ECOG): 2 - Symptomatic, <50% confined to bed  PHYSICAL EXAM:   GENERAL:alert, no distress and comfortable NECK: supple, thyroid  normal size, non-tender, without nodularity LYMPH:  no palpable lymphadenopathy in the cervical, axillary or inguinal LUNGS: clear to auscultation and percussion with normal breathing effort HEART: regular rate & rhythm and no murmurs and no lower extremity edema ABDOMEN:abdomen soft, non-tender and normal bowel sounds Musculoskeletal:no cyanosis of digits and no clubbing  NEURO: alert & oriented x 3 with fluent speech  LABORATORY DATA:  I have reviewed the data as listed     Component Value Date/Time   NA 139 10/15/2024 0857   NA 141 03/15/2022  1121   K 4.0 10/15/2024 0857   CL 102 10/15/2024 0857   CO2 26 10/15/2024 0857   GLUCOSE 122 (H) 10/15/2024 0857   BUN <5 (L) 10/15/2024 0857   BUN 5 (L) 03/15/2022 1121   CREATININE 0.53 10/15/2024 0857   CALCIUM 9.5 10/15/2024 0857   PROT 7.1 10/15/2024 0857   PROT 6.7 03/15/2022 1121   ALBUMIN 3.7 10/15/2024 0857   ALBUMIN 4.0 03/15/2022 1121   AST 21 10/15/2024 0857   ALT 13 10/15/2024 0857   ALKPHOS 118 10/15/2024 0857   BILITOT 0.7 10/15/2024 0857   BILITOT <0.2 03/15/2022 1121    GFRNONAA >60 10/15/2024 0857   GFRAA >60 08/24/2018 1620     Lab Results  Component Value Date   WBC 6.7 10/15/2024   NEUTROABS 3.0 10/15/2024   HGB 11.8 (L) 10/15/2024   HCT 37.1 10/15/2024   MCV 77.6 (L) 10/15/2024   PLT 363 10/15/2024      Chemistry      Component Value Date/Time   NA 139 10/15/2024 0857   NA 141 03/15/2022 1121   K 4.0 10/15/2024 0857   CL 102 10/15/2024 0857   CO2 26 10/15/2024 0857   BUN <5 (L) 10/15/2024 0857   BUN 5 (L) 03/15/2022 1121   CREATININE 0.53 10/15/2024 0857      Component Value Date/Time   CALCIUM 9.5 10/15/2024 0857   ALKPHOS 118 10/15/2024 0857   AST 21 10/15/2024 0857   ALT 13 10/15/2024 0857   BILITOT 0.7 10/15/2024 0857   BILITOT <0.2 03/15/2022 1121        RADIOGRAPHIC STUDIES: I have personally reviewed the radiological images as listed and agreed with the findings in the report.  CT CHEST LUNG CA SCREEN LOW DOSE W/O CM CLINICAL DATA:  138 pack-year smoking history.  Current smoker.  EXAM: CT CHEST WITHOUT CONTRAST LOW-DOSE FOR LUNG CANCER SCREENING  TECHNIQUE: Multidetector CT imaging of the chest was performed following the standard protocol without IV contrast.  RADIATION DOSE REDUCTION: This exam was performed according to the departmental dose-optimization program which includes automated exposure control, adjustment of the mA and/or kV according to patient size and/or use of iterative reconstruction technique.  COMPARISON:  11/24/2021  FINDINGS: Cardiovascular: Aortic atherosclerosis. Normal heart size, without pericardial effusion. Lad and right coronary artery calcification.  Mediastinum/Nodes: No mediastinal or hilar adenopathy, given limitations of unenhanced CT.  Lungs/Pleura: No pleural fluid. Moderate centrilobular emphysema. Development of bilateral upper lobe irregular densities. Example right upper lobe at 6.5 mm on image 115. Left upper lobe at 6.4 mm on image 90.  Upper Abdomen:  Development of moderate to marked intrahepatic biliary duct dilatation, as detailed on subsequent abdominal CT of 08/16/2024. Normal imaged portions of the spleen, stomach.  Musculoskeletal: Mild convex right upper thoracic spine curvature. Mild osteopenia.  IMPRESSION: Lung-RADS 4A, suspicious. Follow up low-dose chest CT without contrast in 3 months (please use the following order, CT CHEST LCS NODULE FOLLOW-UP W/O CM) is recommended. Alternatively, PET may be considered when there is a solid component 8mm or larger. Bilateral upper lobe irregular nodular densities are new at maximally 6.5 mm.  Intrahepatic biliary duct dilatation, as evaluated on subsequent CT and MRCP [id number].  Aortic atherosclerosis (ICD10-I70.0), coronary artery atherosclerosis and emphysema (ICD10-J43.9).  These results will be called to the ordering clinician or representative by the Radiologist Assistant, and communication documented in the PACS or [employer].  Electronically Signed   By: Rockey Kilts M.D.   On:  08/18/2024 15:52 °  ° °"

## 2024-10-29 NOTE — Patient Instructions (Signed)
 [facility] - A DEPT OF [facility]  Discharge Instructions: Thank you for choosing [facility] to provide your oncology and hematology care.  If you have a lab appointment with the Cancer Center - please note that after April 8th, 2024, all labs will be drawn in the cancer center.  You do not have to check in or register with the main entrance as you have in the past but will complete your check-in in the cancer center.  Wear comfortable clothing and clothing appropriate for easy access to any Portacath or PICC line.   We strive to give you quality time with your provider. You may need to reschedule your appointment if you arrive late (15 or more minutes).  Arriving late affects you and other patients whose appointments are after yours.  Also, if you miss three or more appointments without notifying the office, you may be dismissed from the clinic at the providers discretion.      For prescription refill requests, have your pharmacy contact our office and allow 72 hours for refills to be completed.    Today you received the following chemotherapy and/or immunotherapy agents Gemzar , Abraxane . Paclitaxel  Nanoparticle Albumin-Bound Injection What is this medication? NANOPARTICLE ALBUMIN-BOUND PACLITAXEL  (Na no PAHR ti kuhl al BYOO muhn-bound PAK li TAX el) treats some types of cancer. It works by slowing down the growth of cancer cells. This medicine may be used for other purposes; ask your health care provider or pharmacist if you have questions. COMMON BRAND NAME(S): Abraxane  What should I tell my care team before I take this medication? They need to know if you have any of these conditions: Liver disease Low white blood cell levels An unusual or allergic reaction to paclitaxel , albumin, other medications, foods, dyes, or preservatives If you or your partner are pregnant or trying to get pregnant Breast-feeding How should I use this  medication? This medication is infused into a vein. It is given by your care team in a hospital or clinic setting. Talk to your care team about the use of this medication in children. Special care may be needed. Overdosage: If you think you have taken too much of this medicine contact a poison control center or emergency room at once. NOTE: This medicine is only for you. Do not share this medicine with others. What if I miss a dose? Keep appointments for follow-up doses. It is important not to miss your dose. Call your care team if you are unable to keep an appointment. What may interact with this medication? Other medications may affect the way this medication works. Talk with your care team about all of the medications you take. They may suggest changes to your treatment plan to lower the risk of side effects and to make sure your medications work as intended. This list may not describe all possible interactions. Give your health care provider a list of all the medicines, herbs, non-prescription drugs, or dietary supplements you use. Also tell them if you smoke, drink alcohol , or use illegal drugs. Some items may interact with your medicine. What should I watch for while using this medication? Your condition will be monitored carefully while you are receiving this medication. You may need blood work while taking this medication. This medication may make you feel generally unwell. This is not uncommon as chemotherapy can affect healthy cells as well as cancer cells. Report any side effects. Continue your course of treatment even though you feel  ill unless your care team tells you to stop. This medication can cause serious allergic reactions. To reduce the risk, your care team may give you other medications to take before receiving this one. Be sure to follow the directions from your care team. This medication may increase your risk of getting an infection. Call your care team for advice if you get a  fever, chills, sore throat, or other symptoms of a cold or flu. Do not treat yourself. Try to avoid being around people who are sick. This medication may increase your risk to bruise or bleed. Call your care team if you notice any unusual bleeding. Be careful brushing or flossing your teeth or using a toothpick because you may get an infection or bleed more easily. If you have any dental work done, tell your dentist you are receiving this medication. Talk to your care team if you or your partner may be pregnant. Serious birth defects can occur if you take this medication during pregnancy and for 6 months after the last dose. You will need a negative pregnancy test before starting this medication. Contraception is recommended while taking this medication and for 6 months after the last dose. Your care team can help you find the option that works for you. If your partner can get pregnant, use a condom during sex while taking this medication and for 3 months after the last dose. Do not breastfeed while taking this medication and for 2 weeks after the last dose. This medication may cause infertility. Talk to your care team if you are concerned about your fertility. What side effects may I notice from receiving this medication? Side effects that you should report to your care team as soon as possible: Allergic reactions--skin rash, itching, hives, swelling of the face, lips, tongue, or throat Dry cough, shortness of breath or trouble breathing Infection--fever, chills, cough, sore throat, wounds that don't heal, pain or trouble when passing urine, general feeling of discomfort or being unwell Low red blood cell level--unusual weakness or fatigue, dizziness, headache, trouble breathing Pain, tingling, or numbness in the hands or feet Stomach pain, unusual weakness or fatigue, nausea, vomiting, diarrhea, or fever that lasts longer than expected Unusual bruising or bleeding Side effects that usually do not  require medical attention (report to your care team if they continue or are bothersome): Diarrhea Fatigue Hair loss Loss of appetite Nausea Vomiting This list may not describe all possible side effects. Call your doctor for medical advice about side effects. You may report side effects to FDA at 1-800-FDA-1088. Where should I keep my medication? This medication is given in a hospital or clinic. It will not be stored at home. NOTE: This sheet is a summary. It may not cover all possible information. If you have questions about this medicine, talk to your doctor, pharmacist, or health care provider.  2025 Elsevier/Gold Standard (2024-07-17 00:00:00)Gemcitabine  Injection What is this medication? GEMCITABINE  (jem SYE ta been) treats some types of cancer. It works by slowing down the growth of cancer cells. This medicine may be used for other purposes; ask your health care provider or pharmacist if you have questions. COMMON BRAND NAME(S): Gemzar , Infugem  What should I tell my care team before I take this medication? They need to know if you have any of these conditions: Blood disorders Infection Kidney disease Liver disease Lung or breathing disease, such as asthma or COPD Recent or ongoing radiation therapy An unusual or allergic reaction to gemcitabine , other medications, foods, dyes, or  preservatives If you or your partner are pregnant or trying to get pregnant Breast-feeding How should I use this medication? This medication is injected into a vein. It is given by your care team in a hospital or clinic setting. Talk to your care team about the use of this medication in children. Special care may be needed. Overdosage: If you think you have taken too much of this medicine contact a poison control center or emergency room at once. NOTE: This medicine is only for you. Do not share this medicine with others. What if I miss a dose? Keep appointments for follow-up doses. It is important not  to miss your dose. Call your care team if you are unable to keep an appointment. What may interact with this medication? Interactions have not been studied. This list may not describe all possible interactions. Give your health care provider a list of all the medicines, herbs, non-prescription drugs, or dietary supplements you use. Also tell them if you smoke, drink alcohol , or use illegal drugs. Some items may interact with your medicine. What should I watch for while using this medication? Your condition will be monitored carefully while you are receiving this medication. This medication may make you feel generally unwell. This is not uncommon, as chemotherapy can affect healthy cells as well as cancer cells. Report any side effects. Continue your course of treatment even though you feel ill unless your care team tells you to stop. In some cases, you may be given additional medications to help with side effects. Follow all directions for their use. This medication may increase your risk of getting an infection. Call your care team for advice if you get a fever, chills, sore throat, or other symptoms of a cold or flu. Do not treat yourself. Try to avoid being around people who are sick. This medication may increase your risk to bruise or bleed. Call your care team if you notice any unusual bleeding. Be careful brushing or flossing your teeth or using a toothpick because you may get an infection or bleed more easily. If you have any dental work done, tell your dentist you are receiving this medication. Avoid taking medications that contain aspirin, acetaminophen , ibuprofen, naproxen , or ketoprofen unless instructed by your care team. These medications may hide a fever. Talk to your care team if you or your partner wish to become pregnant or think you might be pregnant. This medication can cause serious birth defects if taken during pregnancy and for 6 months after the last dose. A negative pregnancy test  is required before starting this medication. A reliable form of contraception is recommended while taking this medication and for 6 months after the last dose. Talk to your care team about effective forms of contraception. Do not father a child while taking this medication and for 3 months after the last dose. Use a condom while having sex during this time period. Do not breastfeed while taking this medication and for at least 1 week after the last dose. This medication may cause infertility. Talk to your care team if you are concerned about your fertility. What side effects may I notice from receiving this medication? Side effects that you should report to your care team as soon as possible: Allergic reactions--skin rash, itching, hives, swelling of the face, lips, tongue, or throat Capillary leak syndrome--stomach or muscle pain, unusual weakness or fatigue, feeling faint or lightheaded, decrease in the amount of urine, swelling of the ankles, hands, or feet, trouble breathing Infection--fever,  chills, cough, sore throat, wounds that don't heal, pain or trouble when passing urine, general feeling of discomfort or being unwell Liver injury--right upper belly pain, loss of appetite, nausea, light-colored stool, dark yellow or brown urine, yellowing skin or eyes, unusual weakness or fatigue Low red blood cell level--unusual weakness or fatigue, dizziness, headache, trouble breathing Lung injury--shortness of breath or trouble breathing, cough, spitting up blood, chest pain, fever Stomach pain, bloody diarrhea, pale skin, unusual weakness or fatigue, decrease in the amount of urine, which may be signs of hemolytic uremic syndrome Sudden and severe headache, confusion, change in vision, seizures, which may be signs of posterior reversible encephalopathy syndrome (PRES) Unusual bruising or bleeding Side effects that usually do not require medical attention (report to your care team if they continue or are  bothersome): Diarrhea Drowsiness Hair loss Nausea Pain, redness, or swelling with sores inside the mouth or throat Vomiting This list may not describe all possible side effects. Call your doctor for medical advice about side effects. You may report side effects to FDA at 1-800-FDA-1088. Where should I keep my medication? This medication is given in a hospital or clinic. It will not be stored at home. NOTE: This sheet is a summary. It may not cover all possible information. If you have questions about this medicine, talk to your doctor, pharmacist, or health care provider.  2024 Elsevier/Gold Standard (2022-01-17 00:00:00)      To help prevent nausea and vomiting after your treatment, we encourage you to take your nausea medication as directed.  BELOW ARE SYMPTOMS THAT SHOULD BE REPORTED IMMEDIATELY: *FEVER GREATER THAN 100.4 F (38 C) OR HIGHER *CHILLS OR SWEATING *NAUSEA AND VOMITING THAT IS NOT CONTROLLED WITH YOUR NAUSEA MEDICATION *UNUSUAL SHORTNESS OF BREATH *UNUSUAL BRUISING OR BLEEDING *URINARY PROBLEMS (pain or burning when urinating, or frequent urination) *BOWEL PROBLEMS (unusual diarrhea, constipation, pain near the anus) TENDERNESS IN MOUTH AND THROAT WITH OR WITHOUT PRESENCE OF ULCERS (sore throat, sores in mouth, or a toothache) UNUSUAL RASH, SWELLING OR PAIN  UNUSUAL VAGINAL DISCHARGE OR ITCHING   Items with * indicate a potential emergency and should be followed up as soon as possible or go to the Emergency Department if any problems should occur.  Please show the CHEMOTHERAPY ALERT CARD or IMMUNOTHERAPY ALERT CARD at check-in to the Emergency Department and triage nurse.  Should you have questions after your visit or need to cancel or reschedule your appointment, please contact [facility] [place] - A DEPT OF [facility] [phone]  and follow the prompts.  Office hours are 8:00 a.m. to 4:30 p.m. Monday - Friday. Please note that voicemails  left after 4:00 p.m. may not be returned until the following business day.  We are closed weekends and major holidays. You have access to a nurse at all times for urgent questions. Please call the main number to the clinic [phone] and follow the prompts.  For any non-urgent questions, you may also contact your provider using MyChart. We now offer e-Visits for anyone 52 and older to request care online for non-urgent symptoms. For details visit [email or web address].   Also download the MyChart app! Go to the app store, search MyChart, open the app, select [place], and log in with your MyChart username and password.

## 2024-10-29 NOTE — Progress Notes (Signed)
 Maintain Abraxane  chemotherapy dose at 200 mg despite slight weight change.  EPIC vial rounding.  Will assess with next OV.  V.O. Dr Ivery Molt, PharmD

## 2024-10-29 NOTE — Patient Instructions (Signed)

## 2024-10-31 ENCOUNTER — Encounter: Payer: Self-pay | Admitting: Oncology

## 2024-11-12 ENCOUNTER — Inpatient Hospital Stay

## 2024-11-17 ENCOUNTER — Inpatient Hospital Stay: Admitting: Dietician

## 2024-11-26 ENCOUNTER — Inpatient Hospital Stay

## 2024-11-26 ENCOUNTER — Inpatient Hospital Stay: Attending: Oncology

## 2024-11-26 ENCOUNTER — Inpatient Hospital Stay: Admitting: Oncology

## 2025-04-27 ENCOUNTER — Ambulatory Visit: Admitting: Physician Assistant
